# Patient Record
Sex: Male | Born: 1945 | Race: White | Hispanic: No | Marital: Married | Smoking: Former smoker
Health system: Southern US, Community
[De-identification: ages and names within clinical notes are randomized; demographics above are authoritative.]

## PROBLEM LIST (undated history)

## (undated) DIAGNOSIS — M1990 Unspecified osteoarthritis, unspecified site: Secondary | ICD-10-CM

## (undated) DIAGNOSIS — J45909 Unspecified asthma, uncomplicated: Secondary | ICD-10-CM

## (undated) DIAGNOSIS — I10 Essential (primary) hypertension: Secondary | ICD-10-CM

## (undated) DIAGNOSIS — J449 Chronic obstructive pulmonary disease, unspecified: Secondary | ICD-10-CM

## (undated) HISTORY — PX: TONSILLECTOMY: SUR1361

## (undated) HISTORY — PX: HERNIA REPAIR: SHX51

## (undated) HISTORY — DX: Unspecified asthma, uncomplicated: J45.909

## (undated) HISTORY — PX: ADENOIDECTOMY: SUR15

---

## 2014-07-06 DIAGNOSIS — M546 Pain in thoracic spine: Secondary | ICD-10-CM | POA: Diagnosis not present

## 2014-07-06 DIAGNOSIS — M9902 Segmental and somatic dysfunction of thoracic region: Secondary | ICD-10-CM | POA: Diagnosis not present

## 2014-07-21 DIAGNOSIS — M9902 Segmental and somatic dysfunction of thoracic region: Secondary | ICD-10-CM | POA: Diagnosis not present

## 2014-07-21 DIAGNOSIS — M546 Pain in thoracic spine: Secondary | ICD-10-CM | POA: Diagnosis not present

## 2014-09-09 DIAGNOSIS — M9902 Segmental and somatic dysfunction of thoracic region: Secondary | ICD-10-CM | POA: Diagnosis not present

## 2014-09-09 DIAGNOSIS — M546 Pain in thoracic spine: Secondary | ICD-10-CM | POA: Diagnosis not present

## 2014-10-05 DIAGNOSIS — H2512 Age-related nuclear cataract, left eye: Secondary | ICD-10-CM | POA: Diagnosis not present

## 2014-11-04 DIAGNOSIS — M9902 Segmental and somatic dysfunction of thoracic region: Secondary | ICD-10-CM | POA: Diagnosis not present

## 2014-11-04 DIAGNOSIS — M546 Pain in thoracic spine: Secondary | ICD-10-CM | POA: Diagnosis not present

## 2014-11-12 DIAGNOSIS — H25812 Combined forms of age-related cataract, left eye: Secondary | ICD-10-CM | POA: Diagnosis not present

## 2014-11-12 DIAGNOSIS — H2512 Age-related nuclear cataract, left eye: Secondary | ICD-10-CM | POA: Diagnosis not present

## 2014-11-25 DIAGNOSIS — M546 Pain in thoracic spine: Secondary | ICD-10-CM | POA: Diagnosis not present

## 2014-11-25 DIAGNOSIS — M9902 Segmental and somatic dysfunction of thoracic region: Secondary | ICD-10-CM | POA: Diagnosis not present

## 2014-12-10 DIAGNOSIS — J209 Acute bronchitis, unspecified: Secondary | ICD-10-CM | POA: Diagnosis not present

## 2014-12-10 DIAGNOSIS — J069 Acute upper respiratory infection, unspecified: Secondary | ICD-10-CM | POA: Diagnosis not present

## 2014-12-16 DIAGNOSIS — M9902 Segmental and somatic dysfunction of thoracic region: Secondary | ICD-10-CM | POA: Diagnosis not present

## 2014-12-16 DIAGNOSIS — M546 Pain in thoracic spine: Secondary | ICD-10-CM | POA: Diagnosis not present

## 2015-02-04 DIAGNOSIS — J069 Acute upper respiratory infection, unspecified: Secondary | ICD-10-CM | POA: Diagnosis not present

## 2015-02-04 DIAGNOSIS — J019 Acute sinusitis, unspecified: Secondary | ICD-10-CM | POA: Diagnosis not present

## 2015-02-04 DIAGNOSIS — J209 Acute bronchitis, unspecified: Secondary | ICD-10-CM | POA: Diagnosis not present

## 2015-02-24 DIAGNOSIS — M9902 Segmental and somatic dysfunction of thoracic region: Secondary | ICD-10-CM | POA: Diagnosis not present

## 2015-02-24 DIAGNOSIS — M546 Pain in thoracic spine: Secondary | ICD-10-CM | POA: Diagnosis not present

## 2015-03-15 DIAGNOSIS — M9905 Segmental and somatic dysfunction of pelvic region: Secondary | ICD-10-CM | POA: Diagnosis not present

## 2015-03-15 DIAGNOSIS — M5441 Lumbago with sciatica, right side: Secondary | ICD-10-CM | POA: Diagnosis not present

## 2015-05-03 DIAGNOSIS — M9905 Segmental and somatic dysfunction of pelvic region: Secondary | ICD-10-CM | POA: Diagnosis not present

## 2015-05-03 DIAGNOSIS — M5441 Lumbago with sciatica, right side: Secondary | ICD-10-CM | POA: Diagnosis not present

## 2015-05-28 DIAGNOSIS — M5441 Lumbago with sciatica, right side: Secondary | ICD-10-CM | POA: Diagnosis not present

## 2015-05-28 DIAGNOSIS — M9905 Segmental and somatic dysfunction of pelvic region: Secondary | ICD-10-CM | POA: Diagnosis not present

## 2015-06-11 DIAGNOSIS — M5412 Radiculopathy, cervical region: Secondary | ICD-10-CM | POA: Diagnosis not present

## 2015-06-11 DIAGNOSIS — M9901 Segmental and somatic dysfunction of cervical region: Secondary | ICD-10-CM | POA: Diagnosis not present

## 2015-06-14 DIAGNOSIS — M5412 Radiculopathy, cervical region: Secondary | ICD-10-CM | POA: Diagnosis not present

## 2015-06-14 DIAGNOSIS — M9901 Segmental and somatic dysfunction of cervical region: Secondary | ICD-10-CM | POA: Diagnosis not present

## 2015-06-24 DIAGNOSIS — M9901 Segmental and somatic dysfunction of cervical region: Secondary | ICD-10-CM | POA: Diagnosis not present

## 2015-06-24 DIAGNOSIS — M5412 Radiculopathy, cervical region: Secondary | ICD-10-CM | POA: Diagnosis not present

## 2015-06-25 DIAGNOSIS — M9901 Segmental and somatic dysfunction of cervical region: Secondary | ICD-10-CM | POA: Diagnosis not present

## 2015-06-25 DIAGNOSIS — M5412 Radiculopathy, cervical region: Secondary | ICD-10-CM | POA: Diagnosis not present

## 2015-06-28 DIAGNOSIS — M5412 Radiculopathy, cervical region: Secondary | ICD-10-CM | POA: Diagnosis not present

## 2015-06-28 DIAGNOSIS — M9901 Segmental and somatic dysfunction of cervical region: Secondary | ICD-10-CM | POA: Diagnosis not present

## 2015-07-01 DIAGNOSIS — M9901 Segmental and somatic dysfunction of cervical region: Secondary | ICD-10-CM | POA: Diagnosis not present

## 2015-07-01 DIAGNOSIS — M5412 Radiculopathy, cervical region: Secondary | ICD-10-CM | POA: Diagnosis not present

## 2015-07-08 DIAGNOSIS — M9901 Segmental and somatic dysfunction of cervical region: Secondary | ICD-10-CM | POA: Diagnosis not present

## 2015-07-08 DIAGNOSIS — M5412 Radiculopathy, cervical region: Secondary | ICD-10-CM | POA: Diagnosis not present

## 2015-07-13 DIAGNOSIS — M5412 Radiculopathy, cervical region: Secondary | ICD-10-CM | POA: Diagnosis not present

## 2015-07-13 DIAGNOSIS — M9901 Segmental and somatic dysfunction of cervical region: Secondary | ICD-10-CM | POA: Diagnosis not present

## 2015-07-15 DIAGNOSIS — M9901 Segmental and somatic dysfunction of cervical region: Secondary | ICD-10-CM | POA: Diagnosis not present

## 2015-07-15 DIAGNOSIS — M5412 Radiculopathy, cervical region: Secondary | ICD-10-CM | POA: Diagnosis not present

## 2015-07-19 DIAGNOSIS — M5412 Radiculopathy, cervical region: Secondary | ICD-10-CM | POA: Diagnosis not present

## 2015-07-19 DIAGNOSIS — M9901 Segmental and somatic dysfunction of cervical region: Secondary | ICD-10-CM | POA: Diagnosis not present

## 2015-07-22 DIAGNOSIS — M5412 Radiculopathy, cervical region: Secondary | ICD-10-CM | POA: Diagnosis not present

## 2015-07-22 DIAGNOSIS — M9901 Segmental and somatic dysfunction of cervical region: Secondary | ICD-10-CM | POA: Diagnosis not present

## 2015-07-26 DIAGNOSIS — M9901 Segmental and somatic dysfunction of cervical region: Secondary | ICD-10-CM | POA: Diagnosis not present

## 2015-07-26 DIAGNOSIS — M5412 Radiculopathy, cervical region: Secondary | ICD-10-CM | POA: Diagnosis not present

## 2015-07-28 DIAGNOSIS — M5412 Radiculopathy, cervical region: Secondary | ICD-10-CM | POA: Diagnosis not present

## 2015-07-28 DIAGNOSIS — M9901 Segmental and somatic dysfunction of cervical region: Secondary | ICD-10-CM | POA: Diagnosis not present

## 2015-08-04 DIAGNOSIS — M5412 Radiculopathy, cervical region: Secondary | ICD-10-CM | POA: Diagnosis not present

## 2015-08-04 DIAGNOSIS — M9901 Segmental and somatic dysfunction of cervical region: Secondary | ICD-10-CM | POA: Diagnosis not present

## 2015-08-09 DIAGNOSIS — M5412 Radiculopathy, cervical region: Secondary | ICD-10-CM | POA: Diagnosis not present

## 2015-08-09 DIAGNOSIS — M9901 Segmental and somatic dysfunction of cervical region: Secondary | ICD-10-CM | POA: Diagnosis not present

## 2015-08-12 DIAGNOSIS — M9901 Segmental and somatic dysfunction of cervical region: Secondary | ICD-10-CM | POA: Diagnosis not present

## 2015-08-12 DIAGNOSIS — M5412 Radiculopathy, cervical region: Secondary | ICD-10-CM | POA: Diagnosis not present

## 2015-08-19 DIAGNOSIS — M5412 Radiculopathy, cervical region: Secondary | ICD-10-CM | POA: Diagnosis not present

## 2015-08-19 DIAGNOSIS — M9901 Segmental and somatic dysfunction of cervical region: Secondary | ICD-10-CM | POA: Diagnosis not present

## 2015-08-23 DIAGNOSIS — M5412 Radiculopathy, cervical region: Secondary | ICD-10-CM | POA: Diagnosis not present

## 2015-08-23 DIAGNOSIS — M9901 Segmental and somatic dysfunction of cervical region: Secondary | ICD-10-CM | POA: Diagnosis not present

## 2015-08-26 DIAGNOSIS — M9901 Segmental and somatic dysfunction of cervical region: Secondary | ICD-10-CM | POA: Diagnosis not present

## 2015-08-26 DIAGNOSIS — M5412 Radiculopathy, cervical region: Secondary | ICD-10-CM | POA: Diagnosis not present

## 2015-09-02 DIAGNOSIS — M9901 Segmental and somatic dysfunction of cervical region: Secondary | ICD-10-CM | POA: Diagnosis not present

## 2015-09-02 DIAGNOSIS — M5412 Radiculopathy, cervical region: Secondary | ICD-10-CM | POA: Diagnosis not present

## 2015-09-08 DIAGNOSIS — M9901 Segmental and somatic dysfunction of cervical region: Secondary | ICD-10-CM | POA: Diagnosis not present

## 2015-09-08 DIAGNOSIS — M5412 Radiculopathy, cervical region: Secondary | ICD-10-CM | POA: Diagnosis not present

## 2015-09-15 ENCOUNTER — Emergency Department (HOSPITAL_COMMUNITY)
Admission: EM | Admit: 2015-09-15 | Discharge: 2015-09-15 | Disposition: A | Discharge status: Home / Self Care | Payer: Medicare Other | Attending: Emergency Medicine | Admitting: Emergency Medicine

## 2015-09-15 ENCOUNTER — Encounter (HOSPITAL_COMMUNITY): Payer: Self-pay

## 2015-09-15 ENCOUNTER — Emergency Department (HOSPITAL_COMMUNITY): Payer: Medicare Other

## 2015-09-15 DIAGNOSIS — R6889 Other general symptoms and signs: Secondary | ICD-10-CM

## 2015-09-15 DIAGNOSIS — R079 Chest pain, unspecified: Secondary | ICD-10-CM | POA: Diagnosis not present

## 2015-09-15 DIAGNOSIS — Z79899 Other long term (current) drug therapy: Secondary | ICD-10-CM | POA: Diagnosis not present

## 2015-09-15 DIAGNOSIS — J111 Influenza due to unidentified influenza virus with other respiratory manifestations: Secondary | ICD-10-CM | POA: Insufficient documentation

## 2015-09-15 DIAGNOSIS — R0602 Shortness of breath: Secondary | ICD-10-CM | POA: Diagnosis present

## 2015-09-15 DIAGNOSIS — F17200 Nicotine dependence, unspecified, uncomplicated: Secondary | ICD-10-CM | POA: Insufficient documentation

## 2015-09-15 LAB — CBC WITH DIFFERENTIAL/PLATELET
BASOS ABS: 0 10*3/uL (ref 0.0–0.1)
Basophils Relative: 0 %
Eosinophils Absolute: 0.1 10*3/uL (ref 0.0–0.7)
Eosinophils Relative: 1 %
HEMATOCRIT: 40.9 % (ref 39.0–52.0)
HEMOGLOBIN: 13.8 g/dL (ref 13.0–17.0)
LYMPHS ABS: 1.7 10*3/uL (ref 0.7–4.0)
LYMPHS PCT: 36 %
MCH: 30.5 pg (ref 26.0–34.0)
MCHC: 33.7 g/dL (ref 30.0–36.0)
MCV: 90.3 fL (ref 78.0–100.0)
Monocytes Absolute: 0.3 10*3/uL (ref 0.1–1.0)
Monocytes Relative: 6 %
NEUTROS ABS: 2.7 10*3/uL (ref 1.7–7.7)
NEUTROS PCT: 57 %
PLATELETS: 201 10*3/uL (ref 150–400)
RBC: 4.53 MIL/uL (ref 4.22–5.81)
RDW: 13.8 % (ref 11.5–15.5)
WBC: 4.7 10*3/uL (ref 4.0–10.5)

## 2015-09-15 LAB — BASIC METABOLIC PANEL
ANION GAP: 6 (ref 5–15)
BUN: 22 mg/dL — ABNORMAL HIGH (ref 6–20)
CHLORIDE: 100 mmol/L — AB (ref 101–111)
CO2: 29 mmol/L (ref 22–32)
Calcium: 8.5 mg/dL — ABNORMAL LOW (ref 8.9–10.3)
Creatinine, Ser: 1.08 mg/dL (ref 0.61–1.24)
GFR calc Af Amer: >60 mL/min (ref >60)
GLUCOSE: 132 mg/dL — AB (ref 65–99)
POTASSIUM: 4.8 mmol/L (ref 3.5–5.1)
Sodium: 135 mmol/L (ref 135–145)

## 2015-09-15 MED ORDER — IPRATROPIUM-ALBUTEROL 0.5-2.5 (3) MG/3ML IN SOLN
3.0000 mL | Freq: Once | RESPIRATORY_TRACT | Status: AC
  Administered 2015-09-15: 3 mL via RESPIRATORY_TRACT
  Filled 2015-09-15: qty 3

## 2015-09-15 MED ORDER — ALBUTEROL SULFATE HFA 108 (90 BASE) MCG/ACT IN AERS: 2.0000 | INHALATION_SPRAY | Freq: Four times a day (QID) | RESPIRATORY_TRACT | Status: DC | PRN

## 2015-09-15 MED ORDER — DM-GUAIFENESIN ER 30-600 MG PO TB12: 1.0000 | ORAL_TABLET | Freq: Two times a day (BID) | ORAL | Status: DC

## 2015-09-15 NOTE — ED Notes (Signed)
Pt ambulated to bathroom. Oxygen dropped to 88% on RA and stayed in high 80's. Pt placed on 2L nasal cannula. Sats increased to 92% at time.

## 2015-09-15 NOTE — ED Notes (Signed)
Pt reports chills and sob since Wednesday.  Says stopped having chills Sunday but still sob.  Denies cough.

## 2015-09-15 NOTE — ED Notes (Signed)
Dr. Zackowski at bedside updating patient and family. 

## 2015-09-15 NOTE — ED Provider Notes (Signed)
CSN: [id number]     Arrival date & time 09/15/15  0825 History  By signing my name below, I, Linna Darner, attest that this documentation has been prepared under the direction and in the presence of physician practitioner, Vanetta Mulders, MD. Electronically Signed: Linna Darner, Scribe. 09/15/2015. 8:37 AM.      Chief Complaint  Patient presents with  . Shortness of Breath    Patient is a 70 y.o. male presenting with shortness of breath. The history is provided by the patient. No language interpreter was used.  Shortness of Breath Severity:  Severe Onset quality:  Sudden Duration:  2 hours Timing:  Constant Progression:  Unable to specify Chronicity:  New Relieved by:  None tried Worsened by:  Nothing tried Ineffective treatments:  None tried Associated symptoms: cough, fever and wheezing   Associated symptoms: no abdominal pain, no chest pain, no headaches, no rash, no sore throat and no vomiting   Cough:    Cough characteristics:  Dry   Severity:  Mild   Onset quality:  Sudden   Duration:  1 week   Timing:  Constant   Progression:  Unable to specify   Chronicity:  New Fever:    Duration:  1 week   Timing:  Unable to specify   Temp source:  Subjective   Progression:  Unable to specify Wheezing:    Severity:  Unable to specify   Onset quality:  Sudden   Duration:  2 hours   Timing:  Constant   Progression:  Unable to specify   Chronicity:  New Risk factors: tobacco use      HPI Comments: Eric Spencer is a 70 y.o. male with no pertinent PMHx who presents to the Emergency Department complaining of sudden onset, constant, severe, SOB and associated wheezing since this morning. Pt states that he woke up this morning, went to the bathroom, and suddenly could not breathe so he came to the ER. Pt also endorses experiencing associated chills beginning one week ago and ending 3 days ago. He endorses a mild, dry cough and myalgias for the last week as well. Pt uses an  inhaler. He did not receive a flu shot this year. He denies chest pain, dysuria, hematuria, or any other associated symptoms.  PCP: Dr. Selena Batten  History reviewed. No pertinent past medical history. Past Surgical History  Procedure Laterality Date  . Hernia repair     No family history on file. Social History  Substance Use Topics  . Smoking status: Current Every Day Smoker  . Smokeless tobacco: None  . Alcohol Use: No    Review of Systems  Constitutional: Positive for fever and chills. Negative for fatigue.  HENT: Negative for rhinorrhea and sore throat.   Eyes: Negative for visual disturbance.  Respiratory: Positive for cough, shortness of breath and wheezing.        Positive for chest congestion  Cardiovascular: Negative for chest pain.  Gastrointestinal: Negative for nausea, vomiting, abdominal pain and diarrhea.  Genitourinary: Negative for dysuria.       Negative for hematuria  Musculoskeletal: Positive for myalgias. Negative for back pain and joint swelling.  Skin: Negative for rash.  Neurological: Negative for headaches.  Hematological: Does not bruise/bleed easily.  Psychiatric/Behavioral: Negative for confusion.      Allergies  Penicillins  Home Medications   Prior to Admission medications   Medication Sig Start Date End Date Taking? Authorizing Provider  VENTOLIN HFA 108 (90 Base) MCG/ACT inhaler Inhale 1 puff into the  lungs every 4 (four) hours as needed for shortness of breath.  08/13/15  Yes Historical Provider, MD  albuterol (PROVENTIL HFA;VENTOLIN HFA) 108 (90 Base) MCG/ACT inhaler Inhale 2 puffs into the lungs every 6 (six) hours as needed for wheezing or shortness of breath. 09/15/15   Vanetta MuldersScott Araina Butrick, MD  dextromethorphan-guaiFENesin (MUCINEX DM) 30-600 MG 12hr tablet Take 1 tablet by mouth 2 (two) times daily. 09/15/15   Vanetta MuldersScott Bobbi Kozakiewicz, MD   BP 134/85 mmHg  Pulse 74  Temp(Src) 97.5 F (36.4 C) (Oral)  Resp 25  Ht 6\' 2"  (1.88 m)  Wt 88.451 kg  BMI  25.03 kg/m2  SpO2 100% Physical Exam  Constitutional: He is oriented to person, place, and time. He appears well-developed and well-nourished. No distress.  HENT:  Head: Normocephalic and atraumatic.  Mouth/Throat: Oropharynx is clear and moist and mucous membranes are normal.  Eyes: Conjunctivae and EOM are normal. Pupils are equal, round, and reactive to light. No scleral icterus.  Neck: Neck supple. No tracheal deviation present.  Cardiovascular: Normal rate and regular rhythm.   Pulmonary/Chest: Effort normal. No respiratory distress. He has wheezes.  Wheezing left side  Abdominal: Bowel sounds are normal. There is no tenderness.  Musculoskeletal: Normal range of motion. He exhibits no edema.       Right ankle: He exhibits no swelling.       Left ankle: He exhibits no swelling.  Negative for bilateral ankle swelling  Neurological: He is alert and oriented to person, place, and time.  Skin: Skin is warm and dry.  Psychiatric: He has a normal mood and affect. His behavior is normal.  Nursing note and vitals reviewed.   ED Course  Procedures (including critical care time)  DIAGNOSTIC STUDIES: Oxygen Saturation is 100% on RA, normal by my interpretation.    COORDINATION OF CARE: 8:37 AM Will order DG Ribs Unilateral w/ Chest Left. Will order blood work. Will order ED EKG and cardiac monitoring. Discussed treatment plan with pt at bedside and pt agreed to plan.  Medications  ipratropium-albuterol (DUONEB) 0.5-2.5 (3) MG/3ML nebulizer solution 3 mL (3 mLs Nebulization Given 09/15/15 1102)    Results for orders placed or performed during the hospital encounter of 09/15/15  CBC with Differential  Result Value Ref Range   WBC 4.7 4.0 - 10.5 K/uL   RBC 4.53 4.22 - 5.81 MIL/uL   Hemoglobin 13.8 13.0 - 17.0 g/dL   HCT 29.540.9 28.439.0 - 13.252.0 %   MCV 90.3 78.0 - 100.0 fL   MCH 30.5 26.0 - 34.0 pg   MCHC 33.7 30.0 - 36.0 g/dL   RDW 44.013.8 10.211.5 - 72.515.5 %   Platelets 201 150 - 400 K/uL    Neutrophils Relative % 57 %   Neutro Abs 2.7 1.7 - 7.7 K/uL   Lymphocytes Relative 36 %   Lymphs Abs 1.7 0.7 - 4.0 K/uL   Monocytes Relative 6 %   Monocytes Absolute 0.3 0.1 - 1.0 K/uL   Eosinophils Relative 1 %   Eosinophils Absolute 0.1 0.0 - 0.7 K/uL   Basophils Relative 0 %   Basophils Absolute 0.0 0.0 - 0.1 K/uL  Basic metabolic panel  Result Value Ref Range   Sodium 135 135 - 145 mmol/L   Potassium 4.8 3.5 - 5.1 mmol/L   Chloride 100 (L) 101 - 111 mmol/L   CO2 29 22 - 32 mmol/L   Glucose, Bld 132 (H) 65 - 99 mg/dL   BUN 22 (H) 6 - 20 mg/dL  Creatinine, Ser 1.08 0.61 - 1.24 mg/dL   Calcium 8.5 (L) 8.9 - 10.3 mg/dL   GFR calc non Af Amer >60 >60 mL/min   GFR calc Af Amer >60 >60 mL/min   Anion gap 6 5 - 15   Dg Ribs Unilateral W/chest Left  09/15/2015  CLINICAL DATA:  Productive cough and left-sided chest pain, no known injury, initial encounter EXAM: LEFT RIBS AND CHEST - 3+ VIEW COMPARISON:  None. FINDINGS: Cardiac shadow is within normal limits. The lungs are mildly hyperinflated. No focal infiltrate or sizable effusion is seen. No acute rib fracture is noted. No other focal abnormality is seen. IMPRESSION: No acute abnormality noted. Electronically Signed   By: Alcide Clever M.D.   On: 09/15/2015 09:01     MDM   Final diagnoses:  Flu-like symptoms     patient with one-week history of flulike illness. Was doing some wheezing here resolved with albuterol Atrovent nebulizer. Oxygen was a placed on the patient early on when he went to the bathroom and his sats dropped down to 88%. This was prior to him having his nebulizer treatment. Since nebulizer treatment patient is felt fine. Sats at rest have a been consistently above 90%. Patient feels much better. The wheezing resolved with the nebulizer treatment. Patient will be continued on albuterol inhaler and Mucinex DM at home. Chest x-ray negative for pneumonia. Labs without significant abnormalities.   I personally performed  the services described in this documentation, which was scribed in my presence. The recorded information has been reviewed and is accurate.     Vanetta Mulders, MD 09/15/15 1329

## 2015-09-15 NOTE — ED Notes (Signed)
Dr. Zackowski at bedside  

## 2015-09-15 NOTE — ED Notes (Signed)
Respiratory at bedside.

## 2015-09-15 NOTE — Discharge Instructions (Signed)
Continue the albuterol inhaler 2 puffs every 6 hours for the next 7 days. Take Mucinex DM as directed. Make up on pathology record Dr. Return for new or worse symptoms.

## 2015-09-15 NOTE — ED Notes (Signed)
Dr. Deretha EmoryZackowski at bedside updating patient and family.

## 2015-09-22 DIAGNOSIS — M5412 Radiculopathy, cervical region: Secondary | ICD-10-CM | POA: Diagnosis not present

## 2015-09-22 DIAGNOSIS — M9901 Segmental and somatic dysfunction of cervical region: Secondary | ICD-10-CM | POA: Diagnosis not present

## 2015-09-30 DIAGNOSIS — M9901 Segmental and somatic dysfunction of cervical region: Secondary | ICD-10-CM | POA: Diagnosis not present

## 2015-09-30 DIAGNOSIS — M5412 Radiculopathy, cervical region: Secondary | ICD-10-CM | POA: Diagnosis not present

## 2015-10-20 DIAGNOSIS — M9901 Segmental and somatic dysfunction of cervical region: Secondary | ICD-10-CM | POA: Diagnosis not present

## 2015-10-20 DIAGNOSIS — M5412 Radiculopathy, cervical region: Secondary | ICD-10-CM | POA: Diagnosis not present

## 2015-10-28 DIAGNOSIS — M9901 Segmental and somatic dysfunction of cervical region: Secondary | ICD-10-CM | POA: Diagnosis not present

## 2015-10-28 DIAGNOSIS — M5412 Radiculopathy, cervical region: Secondary | ICD-10-CM | POA: Diagnosis not present

## 2015-11-03 DIAGNOSIS — M25511 Pain in right shoulder: Secondary | ICD-10-CM | POA: Diagnosis not present

## 2015-11-03 DIAGNOSIS — M25521 Pain in right elbow: Secondary | ICD-10-CM | POA: Diagnosis not present

## 2015-11-10 DIAGNOSIS — M25511 Pain in right shoulder: Secondary | ICD-10-CM | POA: Diagnosis not present

## 2015-11-10 DIAGNOSIS — M6281 Muscle weakness (generalized): Secondary | ICD-10-CM | POA: Diagnosis not present

## 2015-11-15 DIAGNOSIS — M25511 Pain in right shoulder: Secondary | ICD-10-CM | POA: Diagnosis not present

## 2015-11-15 DIAGNOSIS — M6281 Muscle weakness (generalized): Secondary | ICD-10-CM | POA: Diagnosis not present

## 2015-11-18 DIAGNOSIS — M25511 Pain in right shoulder: Secondary | ICD-10-CM | POA: Diagnosis not present

## 2015-11-18 DIAGNOSIS — M6281 Muscle weakness (generalized): Secondary | ICD-10-CM | POA: Diagnosis not present

## 2015-11-24 DIAGNOSIS — M6281 Muscle weakness (generalized): Secondary | ICD-10-CM | POA: Diagnosis not present

## 2015-11-24 DIAGNOSIS — M25511 Pain in right shoulder: Secondary | ICD-10-CM | POA: Diagnosis not present

## 2015-11-25 DIAGNOSIS — M9901 Segmental and somatic dysfunction of cervical region: Secondary | ICD-10-CM | POA: Diagnosis not present

## 2015-11-25 DIAGNOSIS — M5412 Radiculopathy, cervical region: Secondary | ICD-10-CM | POA: Diagnosis not present

## 2015-12-01 DIAGNOSIS — M19011 Primary osteoarthritis, right shoulder: Secondary | ICD-10-CM | POA: Diagnosis not present

## 2015-12-01 DIAGNOSIS — M25511 Pain in right shoulder: Secondary | ICD-10-CM | POA: Diagnosis not present

## 2015-12-01 DIAGNOSIS — M7501 Adhesive capsulitis of right shoulder: Secondary | ICD-10-CM | POA: Diagnosis not present

## 2015-12-02 DIAGNOSIS — M6281 Muscle weakness (generalized): Secondary | ICD-10-CM | POA: Diagnosis not present

## 2015-12-02 DIAGNOSIS — M25511 Pain in right shoulder: Secondary | ICD-10-CM | POA: Diagnosis not present

## 2015-12-08 DIAGNOSIS — M6281 Muscle weakness (generalized): Secondary | ICD-10-CM | POA: Diagnosis not present

## 2015-12-08 DIAGNOSIS — M25511 Pain in right shoulder: Secondary | ICD-10-CM | POA: Diagnosis not present

## 2016-01-03 DIAGNOSIS — M9901 Segmental and somatic dysfunction of cervical region: Secondary | ICD-10-CM | POA: Diagnosis not present

## 2016-01-03 DIAGNOSIS — M5412 Radiculopathy, cervical region: Secondary | ICD-10-CM | POA: Diagnosis not present

## 2016-01-06 DIAGNOSIS — M9901 Segmental and somatic dysfunction of cervical region: Secondary | ICD-10-CM | POA: Diagnosis not present

## 2016-01-06 DIAGNOSIS — M5412 Radiculopathy, cervical region: Secondary | ICD-10-CM | POA: Diagnosis not present

## 2016-01-13 ENCOUNTER — Ambulatory Visit (INDEPENDENT_AMBULATORY_CARE_PROVIDER_SITE_OTHER): Payer: Medicare Other | Admitting: Physician Assistant

## 2016-01-13 ENCOUNTER — Encounter: Payer: Self-pay | Admitting: Physician Assistant

## 2016-01-13 VITALS — BP 150/96 | Temp 98.7°F | Wt 184.0 lb

## 2016-01-13 DIAGNOSIS — I10 Essential (primary) hypertension: Secondary | ICD-10-CM | POA: Insufficient documentation

## 2016-01-13 DIAGNOSIS — G6289 Other specified polyneuropathies: Secondary | ICD-10-CM

## 2016-01-13 DIAGNOSIS — J439 Emphysema, unspecified: Secondary | ICD-10-CM | POA: Diagnosis not present

## 2016-01-13 DIAGNOSIS — Z720 Tobacco use: Secondary | ICD-10-CM | POA: Diagnosis not present

## 2016-01-13 DIAGNOSIS — G629 Polyneuropathy, unspecified: Secondary | ICD-10-CM | POA: Insufficient documentation

## 2016-01-13 DIAGNOSIS — F17200 Nicotine dependence, unspecified, uncomplicated: Secondary | ICD-10-CM | POA: Insufficient documentation

## 2016-01-13 DIAGNOSIS — J449 Chronic obstructive pulmonary disease, unspecified: Secondary | ICD-10-CM | POA: Insufficient documentation

## 2016-01-13 MED ORDER — FLUTICASONE FUROATE-VILANTEROL 100-25 MCG/INH IN AEPB: 1.0000 | INHALATION_SPRAY | Freq: Every day | RESPIRATORY_TRACT | Status: DC

## 2016-01-13 MED ORDER — FLUTICASONE FUROATE-VILANTEROL 200-25 MCG/INH IN AEPB: 1.0000 | INHALATION_SPRAY | Freq: Every day | RESPIRATORY_TRACT | Status: DC

## 2016-01-13 MED ORDER — VENTOLIN HFA 108 (90 BASE) MCG/ACT IN AERS: 2.0000 | INHALATION_SPRAY | RESPIRATORY_TRACT | Status: DC | PRN

## 2016-01-13 MED ORDER — VARENICLINE TARTRATE 1 MG PO TABS: 1.0000 mg | ORAL_TABLET | Freq: Two times a day (BID) | ORAL | Status: DC

## 2016-01-13 MED ORDER — VARENICLINE TARTRATE 0.5 MG PO TABS: 0.5000 mg | ORAL_TABLET | Freq: Two times a day (BID) | ORAL | Status: DC

## 2016-01-13 NOTE — Progress Notes (Signed)
Patient ID: Eric Spencer MRN: [id number], DOB: [birth year], 70 y.o. Date of Encounter: @DATE @  Chief Complaint:  Chief Complaint  Patient presents with  . Establish Care    HPI: 70 y.o. year old white male  presents as a new patient to establish care.   He states that he was originally from [place]. At that time moved back to [place] area. Grandchildren live there.   Says that he used to see Dr. Megan MansMcGinnis in [place] until he retired. Says at times he would see him and would be given antibiotics and inhaler but patient says he was never told the diagnosis "COPD."  Says that this past March he went to the hospital and had positive test for flu. Was told that he had "COPD". Was given inhaler medication to use.  Says that after that he was going to establish with a new primary care provider. However he says that that doctor just said quit smoking and walked out. Gave him no prescription medicines.  He says that over the years it would be times that he would be --- mowing the grass or getting up leaves---- that would cause him to go to the doctor and get medications.  He says that over the past 2 weeks he has been decreasing his smoking and is trying to quit. Monday and Tuesday of this week he only smoked 1 cigarette in the morning and 1 in the evening. He smoked 3 cigarettes today.  Says that he started smoking around college. Has quit twice. ~ 3 years ago quit for about one year. The second time he quit he didn't stay quit for a long. Says that most of these years he has smoked on average about 1 pack per day. Says that it is very difficult to quit and is interested in using a medicine to help him quit.  Also discussed his blood pressure is elevated today. Discussed going ahead and starting medication today. However he would prefer to come back  here for 1 more blood pressure check prior to starting meds for this. Says that if it is high on recheck that he is agreeable to take medication but wants to wait and make sure since this is his first visit.  Reports that he has noticed that he has decreased sensation on the bottoms of his toes on both feet.  He has noticed this since about March.  He drinks no alcohol.  No other complaints or concerns today.   No past medical history on file.   Home Meds: Outpatient Prescriptions Prior to Visit  Medication Sig Dispense Refill  . dextromethorphan-guaiFENesin (MUCINEX DM) 30-600 MG 12hr tablet Take 1 tablet by mouth 2 (two) times daily. 14 tablet 1  . albuterol (PROVENTIL HFA;VENTOLIN HFA) 108 (90 Base) MCG/ACT inhaler Inhale 2 puffs into the lungs every 6 (six) hours as needed for wheezing or shortness of breath. 1 Inhaler 2  . VENTOLIN HFA 108 (90 Base) MCG/ACT inhaler Inhale 1 puff into the lungs every 4 (four) hours as needed for shortness of breath.   0   No facility-administered medications prior to visit.    Allergies:  Allergies  Allergen Reactions  . Penicillins         Social History   Social History  . Marital Status: Married    Spouse Name: N/A  . Number  of Children: N/A  . Years of Education: N/A   Occupational History  . Not on file.   Social History Main Topics  . Smoking status: Current Every Day Smoker  . Smokeless tobacco: Not on file  . Alcohol Use: No  . Drug Use: No  . Sexual Activity: Not on file   Other Topics Concern  . Not on file   Social History Narrative    Family History  Problem Relation Age of Onset  . Hypertension Mother   . Arthritis Father   . Stroke Father   . Diabetes Maternal Grandmother      Review of Systems:  See HPI for pertinent ROS. All other ROS negative.    Physical Exam: Blood pressure 150/96, temperature 98.7 F (37.1 C), weight 184 lb (83.462 kg)., Body mass index is 23.61 kg/(m^2). General:  WNWD WM.  Appears in no acute distress. Neck: Supple. No thyromegaly. No lymphadenopathy. No carotid bruit. Lungs: Distant, decreased breath sounds throughout bilaterally. No wheezes rhonchi or rales. Heart: RRR with S1 S2. No murmurs, rubs, or gallops. Abdomen: Soft, non-tender, non-distended with normoactive bowel sounds. No hepatomegaly. No rebound/guarding. No obvious abdominal masses. Musculoskeletal:  Strength and tone normal for age. Extremities/Skin: Warm and dry. No LE edema.  Neuro: Alert and oriented X 3. Moves all extremities spontaneously. Gait is normal. CNII-XII grossly in tact. Psych:  Responds to questions appropriately with a normal affect.     ASSESSMENT AND PLAN:  70 y.o. year old male with  1. Pulmonary emphysema, unspecified emphysema type (HCC) He reports that recently he has been needing his Ventolin 4 times a day. Discussed that he uses the [facility] once a day regardless of his breathing. He continues to use the Ventolin as needed. - VENTOLIN HFA 108 (90 Base) MCG/ACT inhaler; Inhale 2 puffs into the lungs every 4 (four) hours as needed for shortness of breath.  Dispense: 1 Inhaler; Refill: 6 - fluticasone furoate-vilanterol (BREO ELLIPTA) 100-25 MCG/INH AEPB; Inhale 1 puff into the lungs daily.  Dispense: 1 each; Refill: 6  2. Essential hypertension He is going to return for follow-up visit in about 2 weeks. We'll recheck blood pressure at that time to determine whether he needs medication for hypertension.  3. Smoker Reviewed with him that the 0.5 mg the starting pack and at the 1 mg dose is the continuing pack to use after the 0.5 mg pack. If cost ends up being an issue with his given insurance plan, then will switch to Wellbutrin. Also, discussed that if he thinks he is having any adverse effects with the Chantix and stop the medication and call us immediately. - varenicline (CHANTIX) 0.5 MG tablet; Take 1 tablet (0.5 mg total) by mouth 2 (two) times daily.  Dispense: 60  tablet; Refill: 0 - varenicline (CHANTIX CONTINUING MONTH PAK) 1 MG tablet; Take 1 tablet (1 mg total) by mouth 2 (two) times daily.  Dispense: 60 tablet; Refill: 3  4. Other polyneuropathy (HCC) Will check labs to evaluate for cause of his decreased sensation in his toes bilaterally. He reports that he drinks no alcohol. - CBC with Differential/Platelet - COMPLETE METABOLIC PANEL WITH GFR - TSH - Hemoglobin A1c - Vitamin B12   He is going to schedule follow-up office visit in 2 weeks. Discussed with him that if he wants to, he can schedule that as a physical. Also discussed that if he wants to, he can come fasting to that appointment and can check lipid panel. Regardless, he will  definitely follow up in 2 weeks to recheck blood pressure to determine whether he needs medication for hypertension.  [address] [place], [place], [facility] 01/13/2016 3:53 PM

## 2016-01-14 LAB — HEMOGLOBIN A1C
HEMOGLOBIN A1C: 5.3 % (ref <5.7)
MEAN PLASMA GLUCOSE: 105 mg/dL

## 2016-01-14 LAB — CBC WITH DIFFERENTIAL/PLATELET
Basophils Absolute: 0 cells/uL (ref 0–200)
Basophils Relative: 0 %
EOS PCT: 2 %
Eosinophils Absolute: 112 cells/uL (ref 15–500)
HCT: 40.9 % (ref 38.5–50.0)
HEMOGLOBIN: 13.2 g/dL (ref 13.0–17.0)
LYMPHS PCT: 29 %
Lymphs Abs: 1624 cells/uL (ref 850–3900)
MCH: 30.2 pg (ref 27.0–33.0)
MCHC: 32.3 g/dL (ref 32.0–36.0)
MCV: 93.6 fL (ref 80.0–100.0)
MONOS PCT: 4 %
MPV: 9.6 fL (ref 7.5–12.5)
Monocytes Absolute: 224 cells/uL (ref 200–950)
Neutro Abs: 3640 cells/uL (ref 1500–7800)
Neutrophils Relative %: 65 %
PLATELETS: 223 10*3/uL (ref 140–400)
RBC: 4.37 MIL/uL (ref 4.20–5.80)
RDW: 14.1 % (ref 11.0–15.0)
WBC: 5.6 10*3/uL (ref 3.8–10.8)

## 2016-01-14 LAB — COMPLETE METABOLIC PANEL WITH GFR
ALBUMIN: 3.8 g/dL (ref 3.6–5.1)
ALK PHOS: 58 U/L (ref 40–115)
ALT: 7 U/L — ABNORMAL LOW (ref 9–46)
AST: 12 U/L (ref 10–35)
BILIRUBIN TOTAL: 0.5 mg/dL (ref 0.2–1.2)
BUN: 27 mg/dL — ABNORMAL HIGH (ref 7–25)
CO2: 26 mmol/L (ref 20–31)
Calcium: 8.7 mg/dL (ref 8.6–10.3)
Chloride: 104 mmol/L (ref 98–110)
Creat: 1.29 mg/dL — ABNORMAL HIGH (ref 0.70–1.25)
GFR, EST AFRICAN AMERICAN: 65 mL/min (ref >=60)
GFR, EST NON AFRICAN AMERICAN: 57 mL/min — AB (ref >=60)
Glucose, Bld: 89 mg/dL (ref 70–99)
Potassium: 4.6 mmol/L (ref 3.5–5.3)
Sodium: 137 mmol/L (ref 135–146)
TOTAL PROTEIN: 6.4 g/dL (ref 6.1–8.1)

## 2016-01-14 LAB — VITAMIN B12: Vitamin B-12: 407 pg/mL (ref 200–1100)

## 2016-01-14 LAB — TSH: TSH: 1.7 m[IU]/L (ref 0.40–4.50)

## 2016-01-27 ENCOUNTER — Encounter: Payer: Self-pay | Admitting: *Deleted

## 2016-01-27 ENCOUNTER — Ambulatory Visit: Payer: Medicare Other | Admitting: *Deleted

## 2016-01-27 VITALS — BP 160/78

## 2016-01-27 DIAGNOSIS — I10 Essential (primary) hypertension: Secondary | ICD-10-CM

## 2016-01-27 DIAGNOSIS — M5412 Radiculopathy, cervical region: Secondary | ICD-10-CM | POA: Diagnosis not present

## 2016-01-27 DIAGNOSIS — M9901 Segmental and somatic dysfunction of cervical region: Secondary | ICD-10-CM | POA: Diagnosis not present

## 2016-01-27 NOTE — Progress Notes (Signed)
  Patient seen in office to have BP re-checked.   I have copied relevant portions of prior office visit with PA on 01/13/2016.  "Discussed going ahead and starting medication today. However he would prefer to come back here for 1 more blood pressure check prior to starting meds for this. Says that if it is high on recheck that he is agreeable to take medication but wants to wait and make sure since this is his first visit.   Essential hypertension He is going to return for follow-up visit in about 2 weeks. We'll recheck blood pressure at that time to determine whether he needs medication for hypertension."  Patient BP noted elevated at 168/ 84 in R arm, sitting and 160/ 78 in L arm sitting after waiting 5 minutes.   Patient scheduled OV with MD to F/U and discuss BP medication since PCP is on vacation.   Patient reports that he has had high BP readings every time he has come to the doctor since he was in high school. 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   MD to be made aware.

## 2016-01-28 ENCOUNTER — Encounter: Payer: Self-pay | Admitting: Family Medicine

## 2016-01-28 ENCOUNTER — Ambulatory Visit (INDEPENDENT_AMBULATORY_CARE_PROVIDER_SITE_OTHER): Payer: Medicare Other | Admitting: Family Medicine

## 2016-01-28 VITALS — BP 158/84 | HR 84 | Temp 98.2°F | Resp 18 | Ht 74.0 in | Wt 186.0 lb

## 2016-01-28 DIAGNOSIS — Z720 Tobacco use: Secondary | ICD-10-CM | POA: Diagnosis not present

## 2016-01-28 DIAGNOSIS — I10 Essential (primary) hypertension: Secondary | ICD-10-CM | POA: Diagnosis not present

## 2016-01-28 DIAGNOSIS — F17200 Nicotine dependence, unspecified, uncomplicated: Secondary | ICD-10-CM

## 2016-01-28 MED ORDER — LOSARTAN POTASSIUM 100 MG PO TABS: 100.0000 mg | ORAL_TABLET | Freq: Every day | ORAL | 3 refills | Status: DC

## 2016-01-28 NOTE — Progress Notes (Signed)
   Subjective:    Patient ID: Eric Spencer, male    DOB: Nov 29, 1945, 70 y.o.   MRN: [id number]  HPI Patient's blood pressure here today is very high. It was also high earlier this week at a nurse visit. His systolic blood pressure is frequently recorded and found to be between 150 and 160. However he is convinced that he has white coat syndrome. He also smokes. I reviewed his most recent lab work. All his labs were excellent. We do not know what his cholesterol is. He denies any chest pain. He does have dyspnea on exertion which he attributes to his emphysema No past medical history on file. Past Surgical History:  Procedure Laterality Date  . HERNIA REPAIR     Current Outpatient Prescriptions on File Prior to Visit  Medication Sig Dispense Refill  . fluticasone furoate-vilanterol (BREO ELLIPTA) 100-25 MCG/INH AEPB Inhale 1 puff into the lungs daily. 1 each 4  . VENTOLIN HFA 108 (90 Base) MCG/ACT inhaler Inhale 2 puffs into the lungs every 4 (four) hours as needed for shortness of breath. 1 Inhaler 6  . varenicline (CHANTIX CONTINUING MONTH PAK) 1 MG tablet Take 1 tablet (1 mg total) by mouth 2 (two) times daily. (Patient not taking: Reported on 01/28/2016) 60 tablet 3  . varenicline (CHANTIX) 0.5 MG tablet Take 1 tablet (0.5 mg total) by mouth 2 (two) times daily. (Patient not taking: Reported on 01/28/2016) 60 tablet 0   No current facility-administered medications on file prior to visit.    Allergies  Allergen Reactions  . Penicillins    Social History   Social History  . Marital status: Married    Spouse name: N/A  . Number of children: N/A  . Years of education: N/A   Occupational History  . Not on file.   Social History Main Topics  . Smoking status: Current Every Day Smoker  . Smokeless tobacco: Not on file  . Alcohol use No  . Drug use: No  . Sexual activity: Not on file   Other Topics Concern  . Not on file   Social History Narrative  . No narrative on file       Review of Systems  All other systems reviewed and are negative.      Objective:   Physical Exam  Constitutional: He appears well-developed and well-nourished.  Cardiovascular: Normal rate, regular rhythm, normal heart sounds and intact distal pulses.   No murmur heard. Pulmonary/Chest: Effort normal and breath sounds normal. No respiratory distress. He has no wheezes. He has no rales.  Abdominal: Soft. Bowel sounds are normal. He exhibits no distension. There is no tenderness. There is no rebound.  Musculoskeletal: He exhibits no edema.  Vitals reviewed.         Assessment & Plan:  Essential hypertension  Smoker  Patient's blood pressures very high. I recommended starting losartan 100 mg a day. He however would like to check his blood pressure daily over the weekend with his own blood pressure cuff to verify that he truly has high blood pressure prior to starting the medication. If he does start the medication I will recheck his blood pressure in one month. Also recommended smoking cessation. Patient is taking ibuprofen several times a day and therefore I did not recommend taking a baby aspirin due to the risk of GI bleed

## 2016-02-07 DIAGNOSIS — M5412 Radiculopathy, cervical region: Secondary | ICD-10-CM | POA: Diagnosis not present

## 2016-02-07 DIAGNOSIS — M9901 Segmental and somatic dysfunction of cervical region: Secondary | ICD-10-CM | POA: Diagnosis not present

## 2016-02-21 ENCOUNTER — Ambulatory Visit (INDEPENDENT_AMBULATORY_CARE_PROVIDER_SITE_OTHER): Payer: Medicare Other | Admitting: Physician Assistant

## 2016-02-21 ENCOUNTER — Encounter: Payer: Self-pay | Admitting: Physician Assistant

## 2016-02-21 VITALS — BP 190/94 | HR 113 | Resp 18 | Wt 188.0 lb

## 2016-02-21 DIAGNOSIS — B9689 Other specified bacterial agents as the cause of diseases classified elsewhere: Secondary | ICD-10-CM

## 2016-02-21 DIAGNOSIS — J988 Other specified respiratory disorders: Secondary | ICD-10-CM | POA: Diagnosis not present

## 2016-02-21 DIAGNOSIS — I10 Essential (primary) hypertension: Secondary | ICD-10-CM

## 2016-02-21 DIAGNOSIS — J439 Emphysema, unspecified: Secondary | ICD-10-CM

## 2016-02-21 MED ORDER — LOSARTAN POTASSIUM 50 MG PO TABS: 50.0000 mg | ORAL_TABLET | Freq: Every day | ORAL | 0 refills | Status: DC

## 2016-02-21 MED ORDER — DOXYCYCLINE HYCLATE 100 MG PO TABS: 100.0000 mg | ORAL_TABLET | Freq: Two times a day (BID) | ORAL | 0 refills | Status: DC

## 2016-02-21 MED ORDER — PREDNISONE 20 MG PO TABS: 20.0000 mg | ORAL_TABLET | Freq: Every day | ORAL | 0 refills | Status: DC

## 2016-02-21 NOTE — Progress Notes (Signed)
Patient ID: Eric Spencer MRN: [id number], DOB: 1946/03/28, 70 y.o. Date of Encounter: '@DATE' @  Chief Complaint:  Chief Complaint  Patient presents with  . office visit    bp check    HPI: 70 y.o. year old white male    01/13/2016: presents as a new patient to establish care.   He states that he was originally from [place]. [place] about [place]. At that time moved back to [place] area. Grandchildren live there.   Says that he used to see Dr. Emilee Hero in [place] until he retired. Says at times he would see him and would be given antibiotics and inhaler but patient says he was never told the diagnosis "COPD."  Says that this past March he went to the hospital and had positive test for flu. Was told that he had "COPD". Was given inhaler medication to use.  Says that after that he was going to establish with a new primary care provider. However he says that that doctor just said quit smoking and walked out. Gave him no prescription medicines.  He says that over the years it would be times that he would be --- mowing the grass or getting up leaves---- that would cause him to go to the doctor and get medications.  He says that over the past 2 weeks he has been decreasing his smoking and is trying to quit. Monday and Tuesday of this week he only smoked 1 cigarette in the morning and 1 in the evening. He smoked 3 cigarettes today.  Says that he started smoking around college. Has quit twice. ~ 3 years ago quit for about one year. The second time he quit he didn't stay quit for a long. Says that most of these years he has smoked on average about 1 pack per day. Says that it is very difficult to quit and is interested in using a medicine to help him quit.  Also discussed his blood pressure is elevated today. Discussed going ahead and starting medication today. However he  would prefer to come back here for 1 more blood pressure check prior to starting meds for this. Says that if it is high on recheck that he is agreeable to take medication but wants to wait and make sure since this is his first visit.  Reports that he has noticed that he has decreased sensation on the bottoms of his toes on both feet.  He has noticed this since about March.  He drinks no alcohol.  No other complaints or concerns today.  AT THAT OV-- Refused to start med blood pressure medication at that time. Instead, planned that he would return for follow-up visit in 1-2 weeks to recheck blood pressure. He came on 01/27/16 and had nurse blood pressure check. She got blood pressure readings elevated at 160/78 and 168/84. Then scheduled to see Dr. Dennard Schaumann for the following day. He had visit with Dr. Dennard Schaumann on 01/28/16.  At that time he started losartan 100 mg daily.  02/21/2016: Today patient reports that he did not start the losartan. Says that the pharmacist made a comment that that was a really high dose to start with. His friends also made the same remark--so then he was scared to take it. Afraid that it was "too much ". Says that he would be agreeable to take the 50 mg tablet but was scared  to take the 100 mg. As well he bought a blood pressure machine and has been checking his blood pressure and writing down his readings and he brings that in today. All of his readings he has documented are 130s over 70s to 140s over 80s. Such as 132/75 140/78 136/74. He also says that he is having coughing and symptoms just like he had in the past. Says it's just like before-- he mowed the grass on Thursday and then "it hit me Friday "coughing up thick dark green phlegm. Has some drainage from his nose. Throat feels scratchy. Not getting much sleep secondary to cough. Has used his albuterol inhaler multiple times yesterday. Feels like he has a lot of congestion in his chest and he can't get out. No fevers or  chills. No other complaints or concerns today.  No past medical history on file.   Home Meds: Outpatient Medications Prior to Visit  Medication Sig Dispense Refill  . fluticasone furoate-vilanterol (BREO ELLIPTA) 100-25 MCG/INH AEPB Inhale 1 puff into the lungs daily. 1 each 4  . VENTOLIN HFA 108 (90 Base) MCG/ACT inhaler Inhale 2 puffs into the lungs every 4 (four) hours as needed for shortness of breath. 1 Inhaler 6  . losartan (COZAAR) 100 MG tablet Take 1 tablet (100 mg total) by mouth daily. (Patient not taking: Reported on 02/21/2016) 90 tablet 3  . varenicline (CHANTIX CONTINUING MONTH PAK) 1 MG tablet Take 1 tablet (1 mg total) by mouth 2 (two) times daily. (Patient not taking: Reported on 01/28/2016) 60 tablet 3  . varenicline (CHANTIX) 0.5 MG tablet Take 1 tablet (0.5 mg total) by mouth 2 (two) times daily. (Patient not taking: Reported on 01/28/2016) 60 tablet 0   No facility-administered medications prior to visit.     Allergies:  Allergies  Allergen Reactions  . Penicillins         Social History   Social History  . Marital status: Married    Spouse name: N/A  . Number of children: N/A  . Years of education: N/A   Occupational History  . Not on file.   Social History Main Topics  . Smoking status: Current Every Day Smoker  . Smokeless tobacco: Not on file  . Alcohol use No  . Drug use: No  . Sexual activity: Not on file   Other Topics Concern  . Not on file   Social History Narrative  . No narrative on file    Family History  Problem Relation Age of Onset  . Hypertension Mother   . Arthritis Father   . Stroke Father   . Diabetes Maternal Grandmother      Review of Systems:  See HPI for pertinent ROS. All other ROS negative.    Physical Exam: Blood pressure (!) 190/94, pulse (!) 113, resp. rate 18, weight 188 lb (85.3 kg)., Body mass index is 24.14 kg/m. General:  WNWD WM. Appears in no acute distress. Neck: Supple. No thyromegaly. No  lymphadenopathy. No carotid bruit. Lungs: Distant, decreased breath sounds throughout bilaterally. No wheezes rhonchi or rales. Heart: RRR with S1 S2. No murmurs, rubs, or gallops. Abdomen: Soft, non-tender, non-distended with normoactive bowel sounds. No hepatomegaly. No rebound/guarding. No obvious abdominal masses. Musculoskeletal:  Strength and tone normal for age. Extremities/Skin: Warm and dry. No LE edema.  Neuro: Alert and oriented X 3. Moves all extremities spontaneously. Gait is normal. CNII-XII grossly in tact. Psych:  Responds to questions appropriately with a normal affect.     ASSESSMENT  AND PLAN:  70 y.o. year old male with   1. Essential hypertension He is to take losartan 50 mg daily. He is to return for follow-up visit in 2 weeks at which time we will recheck blood pressure and be met on medication. - losartan (COZAAR) 50 MG tablet; Take 1 tablet (50 mg total) by mouth daily.  Dispense: 30 tablet; Refill: 0  2. Bacterial respiratory infection He is to take the doxycycline as directed as well as the prednisone. He needs to use Mucinex DM as expectorant. Continue Breo  and continue albuterol as needed. Follow-up if needed. - doxycycline (VIBRA-TABS) 100 MG tablet; Take 1 tablet (100 mg total) by mouth 2 (two) times daily.  Dispense: 20 tablet; Refill: 0 - predniSONE (DELTASONE) 20 MG tablet; Take 1 tablet (20 mg total) by mouth daily with breakfast.  Dispense: 5 tablet; Refill: 0  3. Pulmonary emphysema, unspecified emphysema type (HCC) - doxycycline (VIBRA-TABS) 100 MG tablet; Take 1 tablet (100 mg total) by mouth 2 (two) times daily.  Dispense: 20 tablet; Refill: 0 - predniSONE (DELTASONE) 20 MG tablet; Take 1 tablet (20 mg total) by mouth daily with breakfast.  Dispense: 5 tablet; Refill: 0      Signed, [address] [place], [place], [facility] 02/21/2016 12:11 PM

## 2016-02-22 ENCOUNTER — Telehealth: Payer: Self-pay

## 2016-02-23 NOTE — Telephone Encounter (Signed)
Tessalon 200mg  1 po TID PRN cough # 30 + 1

## 2016-02-24 MED ORDER — BENZONATATE 200 MG PO CAPS: 200.0000 mg | ORAL_CAPSULE | Freq: Three times a day (TID) | ORAL | 1 refills | Status: DC | PRN

## 2016-02-24 NOTE — Telephone Encounter (Signed)
rx sent to pharmacy notified pt

## 2016-03-02 DIAGNOSIS — M9901 Segmental and somatic dysfunction of cervical region: Secondary | ICD-10-CM | POA: Diagnosis not present

## 2016-03-02 DIAGNOSIS — M5412 Radiculopathy, cervical region: Secondary | ICD-10-CM | POA: Diagnosis not present

## 2016-03-08 ENCOUNTER — Ambulatory Visit: Payer: Medicare Other | Admitting: Physician Assistant

## 2016-03-09 ENCOUNTER — Ambulatory Visit (INDEPENDENT_AMBULATORY_CARE_PROVIDER_SITE_OTHER): Payer: Medicare Other | Admitting: Physician Assistant

## 2016-03-09 ENCOUNTER — Encounter: Payer: Self-pay | Admitting: Physician Assistant

## 2016-03-09 VITALS — BP 160/86 | HR 83 | Temp 97.8°F | Resp 18 | Wt 186.0 lb

## 2016-03-09 DIAGNOSIS — I10 Essential (primary) hypertension: Secondary | ICD-10-CM | POA: Diagnosis not present

## 2016-03-09 LAB — BASIC METABOLIC PANEL WITH GFR
BUN: 19 mg/dL (ref 7–25)
CHLORIDE: 104 mmol/L (ref 98–110)
CO2: 29 mmol/L (ref 20–31)
Calcium: 9.4 mg/dL (ref 8.6–10.3)
Creat: 1.15 mg/dL (ref 0.70–1.25)
GFR, Est African American: 75 mL/min (ref >=60)
GFR, Est Non African American: 65 mL/min (ref >=60)
Glucose, Bld: 119 mg/dL — ABNORMAL HIGH (ref 70–99)
POTASSIUM: 5.9 mmol/L — AB (ref 3.5–5.3)
Sodium: 138 mmol/L (ref 135–146)

## 2016-03-09 MED ORDER — LOSARTAN POTASSIUM 100 MG PO TABS: 100.0000 mg | ORAL_TABLET | Freq: Every day | ORAL | 0 refills | Status: DC

## 2016-03-09 NOTE — Progress Notes (Signed)
Patient ID: Eric Spencer MRN: [id number], DOB: [birth year], 70 y.o. Date of Encounter: @DATE @  Chief Complaint:  Chief Complaint  Patient presents with  . Follow-up    HPI: 70 y.o. year old white male    01/13/2016: presents as a new patient to establish care.   He states that he was originally from [place]. [place]. At that time moved back to [place] area. Grandchildren live there.   Says that he used to see Dr. Megan MansMcGinnis in [place] until he retired. Says at times he would see him and would be given antibiotics and inhaler but patient says he was never told the diagnosis "COPD."  Says that this past March he went to the hospital and had positive test for flu. Was told that he had "COPD". Was given inhaler medication to use.  Says that after that he was going to establish with a new primary care provider. However he says that that doctor just said quit smoking and walked out. Gave him no prescription medicines.  He says that over the years it would be times that he would be --- mowing the grass or getting up leaves---- that would cause him to go to the doctor and get medications.  He says that over the past 2 weeks he has been decreasing his smoking and is trying to quit. Monday and Tuesday of this week he only smoked 1 cigarette in the morning and 1 in the evening. He smoked 3 cigarettes today.  Says that he started smoking around college. Has quit twice. ~ 3 years ago quit for about one year. The second time he quit he didn't stay quit for a long. Says that most of these years he has smoked on average about 1 pack per day. Says that it is very difficult to quit and is interested in using a medicine to help him quit.  Also discussed his blood pressure is elevated today. Discussed going ahead and starting medication today. However he would prefer to  come back here for 1 more blood pressure check prior to starting meds for this. Says that if it is high on recheck that he is agreeable to take medication but wants to wait and make sure since this is his first visit.  Reports that he has noticed that he has decreased sensation on the bottoms of his toes on both feet.  He has noticed this since about March.  He drinks no alcohol.  No other complaints or concerns today.  AT THAT OV-- Refused to start med blood pressure medication at that time. Instead, planned that he would return for follow-up visit in 1-2 weeks to recheck blood pressure. He came on 01/27/16 and had nurse blood pressure check. She got blood pressure readings elevated at 160/78 and 168/84. Then scheduled to see Dr. Tanya NonesPickard for the following day. He had visit with Dr. Tanya NonesPickard on 01/28/16.  At that time he started losartan 100 mg daily.  02/21/2016: Today patient reports that he did not start the losartan. Says that the pharmacist made a comment that that was a really high dose to start with. His friends also made the same remark--so then he was scared to take it. Afraid that it was "too much ". Says that he would be agreeable to take the 50 mg tablet but was scared to take the 100 mg. As  well he bought a blood pressure machine and has been checking his blood pressure and writing down his readings and he brings that in today. All of his readings he has documented are 130s over 70s to 140s over 80s. Such as 132/75 140/78 136/74. He also says that he is having coughing and symptoms just like he had in the past. Says it's just like before-- he mowed the grass on Thursday and then "it hit me Friday "coughing up thick dark green phlegm. Has some drainage from his nose. Throat feels scratchy. Not getting much sleep secondary to cough. Has used his albuterol inhaler multiple times yesterday. Feels like he has a lot of congestion in his chest and he can't get out. No fevers or chills. No other  complaints or concerns today.  AT THAT OV---Rxed:  Losartan 50mg  1 po QD Doxy 100mg  BID x 10d Pred 20mg  QD x 5 d  03/09/2016: He reports that he has been taking that losartan 50 mg daily. Having no adverse effects. Says he plans to go to [place] and buy him a new blood pressure monitor that is accurate. Reports that he did take the doxycycline and prednisone as directed. Reports that his chest congestion and increased cough have resolved and have returned to his baseline. No longer coughing up thick dark phlegm.  No past medical history on file.   Home Meds: Outpatient Medications Prior to Visit  Medication Sig Dispense Refill  . doxycycline (VIBRA-TABS) 100 MG tablet Take 1 tablet (100 mg total) by mouth 2 (two) times daily. 20 tablet 0  . fluticasone furoate-vilanterol (BREO ELLIPTA) 100-25 MCG/INH AEPB Inhale 1 puff into the lungs daily. 1 each 4  . VENTOLIN HFA 108 (90 Base) MCG/ACT inhaler Inhale 2 puffs into the lungs every 4 (four) hours as needed for shortness of breath. 1 Inhaler 6  . losartan (COZAAR) 50 MG tablet Take 1 tablet (50 mg total) by mouth daily. 30 tablet 0  . benzonatate (TESSALON) 200 MG capsule Take 1 capsule (200 mg total) by mouth 3 (three) times daily as needed for cough. (Patient not taking: Reported on 03/09/2016) 30 capsule 1  . predniSONE (DELTASONE) 20 MG tablet Take 1 tablet (20 mg total) by mouth daily with breakfast. (Patient not taking: Reported on 03/09/2016) 5 tablet 0   No facility-administered medications prior to visit.     Allergies:  Allergies  Allergen Reactions  . Penicillins         Social History   Social History  . Marital status: Married    Spouse name: N/A  . Number of children: N/A  . Years of education: N/A   Occupational History  . Not on file.   Social History Main Topics  . Smoking status: Current Some Day Smoker  . Smokeless tobacco: Never Used  . Alcohol use No  . Drug use: No  . Sexual activity: Not  on file   Other Topics Concern  . Not on file   Social History Narrative  . No narrative on file    Family History  Problem Relation Age of Onset  . Hypertension Mother   . Arthritis Father   . Stroke Father   . Diabetes Maternal Grandmother      Review of Systems:  See HPI for pertinent ROS. All other ROS negative.    Physical Exam: Blood pressure (!) 160/86, pulse 83, temperature 97.8 F (36.6 C), temperature source Oral, resp. rate 18, weight 186 lb (84.4 kg)., Body mass  index is 23.88 kg/m. General:  WNWD WM. Appears in no acute distress. Neck: Supple. No thyromegaly. No lymphadenopathy. No carotid bruit. Lungs: Distant, decreased breath sounds throughout bilaterally. No wheezes rhonchi or rales. Heart: RRR with S1 S2. No murmurs, rubs, or gallops. Abdomen: Soft, non-tender, non-distended with normoactive bowel sounds. No hepatomegaly. No rebound/guarding. No obvious abdominal masses. Musculoskeletal:  Strength and tone normal for age. Extremities/Skin: Warm and dry. No LE edema.  Neuro: Alert and oriented X 3. Moves all extremities spontaneously. Gait is normal. CNII-XII grossly in tact. Psych:  Responds to questions appropriately with a normal affect.     ASSESSMENT AND PLAN:  70 y.o. year old male with    1. Essential hypertension I rechecked blood pressure myself and am getting about 172/90. He definitely needs to increase blood pressure medication. Discussed increasing losartan to 100 mg and he is agreeable to do so.  Will go ahead and check BMET today to monitor on the 50 mg dose and then he will increase to 100 mg.  He will then return for follow-up visit again in 2 weeks to recheck BP and BMET on the increased 100 mg dose.  Discussed all of this with him and he voices understanding and agrees. - BASIC METABOLIC PANEL WITH GFR - losartan (COZAAR) 100 MG tablet; Take 1 tablet (100 mg total) by mouth daily.  Dispense: 30 tablet; Refill: 0     Signed, [address] [place], [place], [facility] 03/09/2016 11:41 AM

## 2016-03-23 ENCOUNTER — Encounter: Payer: Self-pay | Admitting: Physician Assistant

## 2016-03-23 ENCOUNTER — Ambulatory Visit (INDEPENDENT_AMBULATORY_CARE_PROVIDER_SITE_OTHER): Payer: Medicare Other | Admitting: Physician Assistant

## 2016-03-23 VITALS — BP 140/80 | HR 74 | Temp 97.7°F | Resp 18 | Wt 191.0 lb

## 2016-03-23 DIAGNOSIS — I10 Essential (primary) hypertension: Secondary | ICD-10-CM

## 2016-03-23 LAB — BASIC METABOLIC PANEL WITH GFR
BUN: 22 mg/dL (ref 7–25)
CHLORIDE: 106 mmol/L (ref 98–110)
CO2: 28 mmol/L (ref 20–31)
CREATININE: 1.01 mg/dL (ref 0.70–1.25)
Calcium: 9.1 mg/dL (ref 8.6–10.3)
GFR, Est African American: 88 mL/min (ref >=60)
GFR, Est Non African American: 76 mL/min (ref >=60)
GLUCOSE: 108 mg/dL — AB (ref 70–99)
POTASSIUM: 5.1 mmol/L (ref 3.5–5.3)
Sodium: 141 mmol/L (ref 135–146)

## 2016-03-23 NOTE — Progress Notes (Signed)
Patient ID: Johnney OuCharlie E Oakes Jr MRN: [id number], DOB: [birth year], 70 y.o. Date of Encounter: @DATE @  Chief Complaint:  Chief Complaint  Patient presents with  . Follow-up    HPI: 70 y.o. year old white male    01/13/2016: presents as a new patient to establish care.   He states that he was originally from [place]. [place]. At that time moved back to [place] area. Grandchildren live there.   Says that he used to see Dr. Megan MansMcGinnis in [place] until he retired. Says at times he would see him and would be given antibiotics and inhaler but patient says he was never told the diagnosis "COPD."  Says that this past March he went to the hospital and had positive test for flu. Was told that he had "COPD". Was given inhaler medication to use.  Says that after that he was going to establish with a new primary care provider. However he says that that doctor just said quit smoking and walked out. Gave him no prescription medicines.  He says that over the years it would be times that he would be --- mowing the grass or getting up leaves---- that would cause him to go to the doctor and get medications.  He says that over the past 2 weeks he has been decreasing his smoking and is trying to quit. Monday and Tuesday of this week he only smoked 1 cigarette in the morning and 1 in the evening. He smoked 3 cigarettes today.  Says that he started smoking around college. Has quit twice. ~ 3 years ago quit for about one year. The second time he quit he didn't stay quit for a long. Says that most of these years he has smoked on average about 1 pack per day. Says that it is very difficult to quit and is interested in using a medicine to help him quit.  Also discussed his blood pressure is elevated today. Discussed going ahead and starting medication today. However he would prefer to  come back here for 1 more blood pressure check prior to starting meds for this. Says that if it is high on recheck that he is agreeable to take medication but wants to wait and make sure since this is his first visit.  Reports that he has noticed that he has decreased sensation on the bottoms of his toes on both feet.  He has noticed this since about March.  He drinks no alcohol.  No other complaints or concerns today.  AT THAT OV-- Refused to start med blood pressure medication at that time. Instead, planned that he would return for follow-up visit in 1-2 weeks to recheck blood pressure. He came on 01/27/16 and had nurse blood pressure check. She got blood pressure readings elevated at 160/78 and 168/84. Then scheduled to see Dr. Tanya NonesPickard for the following day. He had visit with Dr. Tanya NonesPickard on 01/28/16.  At that time he started losartan 100 mg daily.  02/21/2016: Today patient reports that he did not start the losartan. Says that the pharmacist made a comment that that was a really high dose to start with. His friends also made the same remark--so then he was scared to take it. Afraid that it was "too much ". Says that he would be agreeable to take the 50 mg tablet but was scared to take the 100 mg. As  well he bought a blood pressure machine and has been checking his blood pressure and writing down his readings and he brings that in today. All of his readings he has documented are 130s over 70s to 140s over 80s. Such as 132/75 140/78 136/74. He also says that he is having coughing and symptoms just like he had in the past. Says it's just like before-- he mowed the grass on Thursday and then "it hit me Friday "coughing up thick dark green phlegm. Has some drainage from his nose. Throat feels scratchy. Not getting much sleep secondary to cough. Has used his albuterol inhaler multiple times yesterday. Feels like he has a lot of congestion in his chest and he can't get out. No fevers or chills. No other  complaints or concerns today.  AT THAT OV---Rxed:  Losartan 50mg  1 po QD Doxy 100mg  BID x 10d Pred 20mg  QD x 5 d  03/09/2016: He reports that he has been taking that losartan 50 mg daily. Having no adverse effects. Says he plans to go to [place] and buy him a new blood pressure monitor that is accurate. Reports that he did take the doxycycline and prednisone as directed. Reports that his chest congestion and increased cough have resolved and have returned to his baseline. No longer coughing up thick dark phlegm. AT THAT OV: Increased Losartan to 100mg  QD  03/23/2016: He reports he has been taking the losartan 100 mg daily. Having no adverse effects. States that he did buy a new blood pressure machine and has checked it against the manual blood pressure to check to make sure that is accurate. Brought in documentation of his recent readings. Says that early morning it runs a little bit higher and it was 142/77. Later in the day 128/78 130/70 and 126/76. No complaints or concerns today.  No past medical history on file.   Home Meds: Outpatient Medications Prior to Visit  Medication Sig Dispense Refill  . fluticasone furoate-vilanterol (BREO ELLIPTA) 100-25 MCG/INH AEPB Inhale 1 puff into the lungs daily. 1 each 4  . losartan (COZAAR) 100 MG tablet Take 1 tablet (100 mg total) by mouth daily. 30 tablet 0  . benzonatate (TESSALON) 200 MG capsule Take 1 capsule (200 mg total) by mouth 3 (three) times daily as needed for cough. (Patient not taking: Reported on 03/23/2016) 30 capsule 1  . doxycycline (VIBRA-TABS) 100 MG tablet Take 1 tablet (100 mg total) by mouth 2 (two) times daily. (Patient not taking: Reported on 03/23/2016) 20 tablet 0  . predniSONE (DELTASONE) 20 MG tablet Take 1 tablet (20 mg total) by mouth daily with breakfast. (Patient not taking: Reported on 03/23/2016) 5 tablet 0  . VENTOLIN HFA 108 (90 Base) MCG/ACT inhaler Inhale 2 puffs into the lungs every 4 (four) hours  as needed for shortness of breath. (Patient not taking: Reported on 03/23/2016) 1 Inhaler 6   No facility-administered medications prior to visit.     Allergies:  Allergies  Allergen Reactions  . Penicillins         Social History   Social History  . Marital status: Married    Spouse name: N/A  . Number of children: N/A  . Years of education: N/A   Occupational History  . Not on file.   Social History Main Topics  . Smoking status: Current Some Day Smoker  . Smokeless tobacco: Never Used  . Alcohol use No  . Drug use: No  . Sexual activity: Not on file  Other Topics Concern  . Not on file   Social History Narrative  . No narrative on file    Family History  Problem Relation Age of Onset  . Hypertension Mother   . Arthritis Father   . Stroke Father   . Diabetes Maternal Grandmother      Review of Systems:  See HPI for pertinent ROS. All other ROS negative.    Physical Exam: Blood pressure 140/80, pulse 74, temperature 97.7 F (36.5 C), temperature source Oral, resp. rate 18, weight 191 lb (86.6 kg)., Body mass index is 24.52 kg/m. General:  WNWD WM. Appears in no acute distress. Neck: Supple. No thyromegaly. No lymphadenopathy. No carotid bruit. Lungs: Distant, decreased breath sounds throughout bilaterally. No wheezes rhonchi or rales. Heart: RRR with S1 S2. No murmurs, rubs, or gallops. Musculoskeletal:  Strength and tone normal for age. Extremities/Skin: Warm and dry. No LE edema.  Neuro: Alert and oriented X 3. Moves all extremities spontaneously. Gait is normal. CNII-XII grossly in tact. Psych:  Responds to questions appropriately with a normal affect.     ASSESSMENT AND PLAN:  [age] y.o. year old male with    1. Essential hypertension He is getting excellent blood pressure readings with his checking at home. Continue current dose of medication. Losartan 100 mg daily. Will check bmet to monitor.  If things remain stable he can wait 6 months for  follow-up office visit. Follow-up sooner if needed.    Murray HodgkinsSigned, Dev Dhondt Beth Ros[place], [place], [facility] 03/23/2016 10:24 AM

## 2016-04-25 DIAGNOSIS — M5412 Radiculopathy, cervical region: Secondary | ICD-10-CM | POA: Diagnosis not present

## 2016-04-25 DIAGNOSIS — M9901 Segmental and somatic dysfunction of cervical region: Secondary | ICD-10-CM | POA: Diagnosis not present

## 2016-05-16 DIAGNOSIS — M5412 Radiculopathy, cervical region: Secondary | ICD-10-CM | POA: Diagnosis not present

## 2016-05-16 DIAGNOSIS — M9901 Segmental and somatic dysfunction of cervical region: Secondary | ICD-10-CM | POA: Diagnosis not present

## 2016-06-07 DIAGNOSIS — M9901 Segmental and somatic dysfunction of cervical region: Secondary | ICD-10-CM | POA: Diagnosis not present

## 2016-06-07 DIAGNOSIS — M5412 Radiculopathy, cervical region: Secondary | ICD-10-CM | POA: Diagnosis not present

## 2016-06-15 DIAGNOSIS — M5412 Radiculopathy, cervical region: Secondary | ICD-10-CM | POA: Diagnosis not present

## 2016-06-15 DIAGNOSIS — M9901 Segmental and somatic dysfunction of cervical region: Secondary | ICD-10-CM | POA: Diagnosis not present

## 2016-07-05 DIAGNOSIS — M5412 Radiculopathy, cervical region: Secondary | ICD-10-CM | POA: Diagnosis not present

## 2016-07-05 DIAGNOSIS — M9901 Segmental and somatic dysfunction of cervical region: Secondary | ICD-10-CM | POA: Diagnosis not present

## 2016-07-17 DIAGNOSIS — M9901 Segmental and somatic dysfunction of cervical region: Secondary | ICD-10-CM | POA: Diagnosis not present

## 2016-07-17 DIAGNOSIS — M5412 Radiculopathy, cervical region: Secondary | ICD-10-CM | POA: Diagnosis not present

## 2016-07-31 DIAGNOSIS — M5442 Lumbago with sciatica, left side: Secondary | ICD-10-CM | POA: Diagnosis not present

## 2016-07-31 DIAGNOSIS — M9903 Segmental and somatic dysfunction of lumbar region: Secondary | ICD-10-CM | POA: Diagnosis not present

## 2016-08-03 DIAGNOSIS — M5442 Lumbago with sciatica, left side: Secondary | ICD-10-CM | POA: Diagnosis not present

## 2016-08-03 DIAGNOSIS — M9903 Segmental and somatic dysfunction of lumbar region: Secondary | ICD-10-CM | POA: Diagnosis not present

## 2016-08-09 DIAGNOSIS — M9903 Segmental and somatic dysfunction of lumbar region: Secondary | ICD-10-CM | POA: Diagnosis not present

## 2016-08-09 DIAGNOSIS — M5442 Lumbago with sciatica, left side: Secondary | ICD-10-CM | POA: Diagnosis not present

## 2016-08-26 ENCOUNTER — Other Ambulatory Visit: Payer: Self-pay | Admitting: Physician Assistant

## 2016-08-26 DIAGNOSIS — J439 Emphysema, unspecified: Secondary | ICD-10-CM

## 2016-08-28 NOTE — Telephone Encounter (Signed)
Refill appropriate 

## 2016-09-06 DIAGNOSIS — M9903 Segmental and somatic dysfunction of lumbar region: Secondary | ICD-10-CM | POA: Diagnosis not present

## 2016-09-06 DIAGNOSIS — M5442 Lumbago with sciatica, left side: Secondary | ICD-10-CM | POA: Diagnosis not present

## 2016-09-20 ENCOUNTER — Ambulatory Visit: Payer: Medicare Other | Admitting: Physician Assistant

## 2016-09-27 ENCOUNTER — Ambulatory Visit: Payer: Self-pay | Admitting: Physician Assistant

## 2016-09-28 DIAGNOSIS — M9903 Segmental and somatic dysfunction of lumbar region: Secondary | ICD-10-CM | POA: Diagnosis not present

## 2016-09-28 DIAGNOSIS — M5442 Lumbago with sciatica, left side: Secondary | ICD-10-CM | POA: Diagnosis not present

## 2016-10-09 ENCOUNTER — Ambulatory Visit: Payer: Medicare Other | Admitting: Physician Assistant

## 2016-10-16 ENCOUNTER — Encounter: Payer: Self-pay | Admitting: Physician Assistant

## 2016-10-16 ENCOUNTER — Ambulatory Visit (INDEPENDENT_AMBULATORY_CARE_PROVIDER_SITE_OTHER): Payer: Medicare Other | Admitting: Physician Assistant

## 2016-10-16 VITALS — BP 200/110 | HR 85 | Temp 97.8°F | Resp 16 | Wt 195.0 lb

## 2016-10-16 DIAGNOSIS — I10 Essential (primary) hypertension: Secondary | ICD-10-CM

## 2016-10-16 DIAGNOSIS — J439 Emphysema, unspecified: Secondary | ICD-10-CM

## 2016-10-16 DIAGNOSIS — J988 Other specified respiratory disorders: Secondary | ICD-10-CM | POA: Diagnosis not present

## 2016-10-16 DIAGNOSIS — B9689 Other specified bacterial agents as the cause of diseases classified elsewhere: Secondary | ICD-10-CM

## 2016-10-16 DIAGNOSIS — J441 Chronic obstructive pulmonary disease with (acute) exacerbation: Secondary | ICD-10-CM

## 2016-10-16 LAB — BASIC METABOLIC PANEL WITH GFR
BUN: 21 mg/dL (ref 7–25)
CO2: 27 mmol/L (ref 20–31)
Calcium: 9.4 mg/dL (ref 8.6–10.3)
Chloride: 105 mmol/L (ref 98–110)
Creat: 1.15 mg/dL (ref 0.70–1.25)
GFR, EST AFRICAN AMERICAN: 75 mL/min (ref >=60)
GFR, Est Non African American: 65 mL/min (ref >=60)
GLUCOSE: 114 mg/dL — AB (ref 70–99)
Potassium: 5.6 mmol/L — ABNORMAL HIGH (ref 3.5–5.3)
Sodium: 139 mmol/L (ref 135–146)

## 2016-10-16 MED ORDER — VENTOLIN HFA 108 (90 BASE) MCG/ACT IN AERS: INHALATION_SPRAY | RESPIRATORY_TRACT | 3 refills | Status: DC

## 2016-10-16 MED ORDER — DOXYCYCLINE HYCLATE 100 MG PO TABS: 100.0000 mg | ORAL_TABLET | Freq: Two times a day (BID) | ORAL | 0 refills | Status: DC

## 2016-10-16 MED ORDER — LOSARTAN POTASSIUM 100 MG PO TABS: 100.0000 mg | ORAL_TABLET | Freq: Every day | ORAL | 1 refills | Status: DC

## 2016-10-16 MED ORDER — AMLODIPINE BESYLATE 5 MG PO TABS: ORAL_TABLET | ORAL | 0 refills | Status: DC

## 2016-10-16 MED ORDER — FLUTICASONE FUROATE-VILANTEROL 100-25 MCG/INH IN AEPB: 1.0000 | INHALATION_SPRAY | Freq: Every day | RESPIRATORY_TRACT | 11 refills | Status: DC

## 2016-10-16 MED ORDER — PREDNISONE 20 MG PO TABS: 20.0000 mg | ORAL_TABLET | Freq: Every day | ORAL | 0 refills | Status: DC

## 2016-10-16 NOTE — Progress Notes (Signed)
Patient ID: Eric Spencer MRN: [id number], DOB: July 11, 1945, 71 y.o. Date of Encounter: @  Chief Complaint:  Chief Complaint  Patient presents with  . Hypertension    6 month f/u  . allergy problems    HPI: 71 y.o. year old white male    01/13/2016: presents as a new patient to establish care.   He states that he was originally from [place]. [place]. At that time moved back to [place] area. Grandchildren live there.   Says that he used to see Dr. Megan Mans in [place] until he retired. Says at times he would see him and would be given antibiotics and inhaler but patient says he was never told the diagnosis "COPD."  Says that this past March he went to the hospital and had positive test for flu. Was told that he had "COPD". Was given inhaler medication to use.  Says that after that he was going to establish with a new primary care provider. However he says that that doctor just said quit smoking and walked out. Gave him no prescription medicines.  He says that over the years it would be times that he would be --- mowing the grass or getting up leaves---- that would cause him to go to the doctor and get medications.  He says that over the past 2 weeks he has been decreasing his smoking and is trying to quit. Monday and Tuesday of this week he only smoked 1 cigarette in the morning and 1 in the evening. He smoked 3 cigarettes today.  Says that he started smoking around college. Has quit twice. ~ 3 years ago quit for about one year. The second time he quit he didn't stay quit for long. Says that most of these years he has smoked on average about 1 pack per day. Says that it is very difficult to quit and is interested in using a medicine to help him quit.  Also discussed his blood pressure is elevated today. Discussed going ahead and starting  medication today. However he would prefer to come back here for 1 more blood pressure check prior to starting meds for this. Says that if it is high on recheck that he is agreeable to take medication but wants to wait and make sure since this is his first visit.  Reports that he has noticed that he has decreased sensation on the bottoms of his toes on both feet.  He has noticed this since about March.  He drinks no alcohol.  No other complaints or concerns today.  AT THAT OV-- Refused to start blood pressure medication at that time. Instead, planned that he would return for follow-up visit in 1-2 weeks to recheck blood pressure. He came on 01/27/16 and had nurse blood pressure check. She got blood pressure readings elevated at 160/78 and 168/84. Then scheduled to see Dr. Tanya Nones for the following day. He had visit with Dr. Tanya Nones on 01/28/16.  At that time he started losartan 100 mg daily.  02/21/2016: Today patient reports that he did not start the losartan. Says that the pharmacist made a comment that that was a really high dose to start with. His friends also made the same remark--so then he was scared to take it. Afraid that it was "too much ". Says that he would be agreeable to take the 50 mg tablet but  was scared to take the 100 mg. As well he bought a blood pressure machine and has been checking his blood pressure and writing down his readings and he brings that in today. All of his readings he has documented are 130s over 70s to 140s over 80s. Such as 132/75 140/78 136/74. He also says that he is having coughing and symptoms just like he had in the past. Says it's just like before-- he mowed the grass on Thursday and then "it hit me Friday "coughing up thick dark green phlegm. Has some drainage from his nose. Throat feels scratchy. Not getting much sleep secondary to cough. Has used his albuterol inhaler multiple times yesterday. Feels like he has a lot of congestion in his chest and he can't get  out. No fevers or chills. No other complaints or concerns today.  AT THAT OV---Rxed:  Losartan  1 po QD Doxy  BID x 10d Pred  QD x 5 d  03/09/2016: He reports that he has been taking the losartan 50 mg daily. Having no adverse effects. Says he plans to go to [place] and buy him a new blood pressure monitor that is accurate. Reports that he did take the doxycycline and prednisone as directed. Reports that his chest congestion and increased cough have resolved and have returned to his baseline. No longer coughing up thick dark phlegm. AT THAT OV: Increased Losartan to  QD  03/23/2016: He reports he has been taking the losartan 100 mg daily. Having no adverse effects. States that he did buy a new blood pressure machine and has checked it against the manual blood pressure to check to make sure that is accurate. Brought in documentation of his recent readings. Says that early morning it runs a little bit higher and it was 142/77. Later in the day 128/78 130/70 and 126/76. No complaints or concerns today.   10/16/2016: He reports that he is taking the blood pressure medication daily. Losartan 100 mg. Says that he has not been checking his blood pressure at home. Says that he has been having increased congestion for the past 3 or 4 weeks. Is using the [facility] and is also using Mucinex but symptoms not resolving. Coughing up increased amount of phlegm compared to his usual. Also feels congested in his chest and his head and nose. Says that he has pretty much quit smoking completely. Says that his friend died in 07/24/2022 and he smoked a few cigarettes when he was upset with that but otherwise has quit. No other complaints or concerns today.    No past medical history on file.   Home Meds: Outpatient Medications Prior to Visit  Medication Sig Dispense Refill  . fluticasone furoate-vilanterol (BREO ELLIPTA) 100-25 MCG/INH AEPB Inhale 1 puff into the lungs daily. 1 each 4   . losartan (COZAAR) 100 MG tablet Take 1 tablet (100 mg total) by mouth daily. 30 tablet 0  . VENTOLIN HFA 108 (90 Base) MCG/ACT inhaler INHALE 2 PUFFS INTO THE LUNGS EVERY 4 HOURS AS NEEDED. 18 g 1  . benzonatate (TESSALON) 200 MG capsule Take 1 capsule (200 mg total) by mouth 3 (three) times daily as needed for cough. (Patient not taking: Reported on 03/23/2016) 30 capsule 1  . doxycycline (VIBRA-TABS) 100 MG tablet Take 1 tablet (100 mg total) by mouth 2 (two) times daily. (Patient not taking: Reported on 03/23/2016) 20 tablet 0  . predniSONE (DELTASONE) 20 MG tablet Take 1 tablet (20 mg total) by mouth daily with  breakfast. (Patient not taking: Reported on 03/23/2016) 5 tablet 0   No facility-administered medications prior to visit.     Allergies:  Allergies  Allergen Reactions  . Penicillins         Social History   Social History  . Marital status: Married    Spouse name: N/A  . Number of children: N/A  . Years of education: N/A   Occupational History  . Not on file.   Social History Main Topics  . Smoking status: Current Some Day Smoker  . Smokeless tobacco: Never Used  . Alcohol use No  . Drug use: No  . Sexual activity: Not on file   Other Topics Concern  . Not on file   Social History Narrative  . No narrative on file    Family History  Problem Relation Age of Onset  . Hypertension Mother   . Arthritis Father   . Stroke Father   . Diabetes Maternal Grandmother      Review of Systems:  See HPI for pertinent ROS. All other ROS negative.    Physical Exam: Blood pressure (!) 200/110, pulse 85, temperature 97.8 F (36.6 C), temperature source Oral, resp. rate 16, weight 195 lb (88.5 kg), SpO2 98 %., There is no height or weight on file to calculate BMI. General:  WNWD WM. Appears in no acute distress. Neck: Supple. No thyromegaly. No lymphadenopathy. No carotid bruit. Lungs: Distant, decreased breath sounds throughout bilaterally. Slight wheezes at bases  bilaterally but very slight. Heart: RRR with S1 S2. No murmurs, rubs, or gallops. Musculoskeletal:  Strength and tone normal for age. Extremities/Skin: Warm and dry. No LE edema.  Neuro: Alert and oriented X 3. Moves all extremities spontaneously. Gait is normal. CNII-XII grossly in tact. Psych:  Responds to questions appropriately with a normal affect.     ASSESSMENT AND PLAN:  71 y.o. year old male with    1. Essential hypertension Blood pressure is significantly high. He says that he is compliant with taking that losartan 100 mg daily. He says that he thinks it is reading high today because of white coat hypertension--says he "always has this problem"-- but I rechecked blood pressure again at the end of the visit-- as he requests-- and it is still reading similar to above reading. I had a long discussion with him that hypertension is often called the silent killer because it does not cause symptoms until it is too late. Had discussion of why I highly recommended that he add these medicines and take as directed. He is to add Norvasc-- will take 5 mg daily for 5 days then increase to taking 2 daily for 10 mg.  Will return for follow-up visit in 2 weeks.  He has not been checking blood pressure at home but says that he will start checking it at home and will bring those readings in with him to follow-up visit.  Also cont the losartan 100 mg daily. Will check labs today to monitor this. - amLODipine (NORVASC) 5 MG tablet; Take 1 daily for 5 days then increase to 2 daily.  Dispense: 30 tablet; Refill: 0 - BASIC METABOLIC PANEL WITH GFR  2. Pulmonary emphysema, unspecified emphysema type (HCC) He is to continue the [place]. Add doxycycline and prednisone for exacerbation. - doxycycline (VIBRA-TABS) 100 MG tablet; Take 1 tablet (100 mg total) by mouth 2 (two) times daily.  Dispense: 20 tablet; Refill: 0 - predniSONE (DELTASONE) 20 MG tablet; Take 1 tablet (20 mg total) by mouth  daily with  breakfast.  Dispense: 5 tablet; Refill: 0  3. COPD exacerbation (HCC) He is to continue the [place]. Add doxycycline and prednisone for exacerbation. - doxycycline (VIBRA-TABS) 100 MG tablet; Take 1 tablet (100 mg total) by mouth 2 (two) times daily.  Dispense: 20 tablet; Refill: 0 - predniSONE (DELTASONE) 20 MG tablet; Take 1 tablet (20 mg total) by mouth daily with breakfast.  Dispense: 5 tablet; Refill: 0   Follow-up visit 2 weeks or sooner if needed.      Signed, [address] [place], [place], [facility] 10/16/2016 10:18 AM

## 2016-10-18 ENCOUNTER — Ambulatory Visit (INDEPENDENT_AMBULATORY_CARE_PROVIDER_SITE_OTHER): Payer: Medicare Other | Admitting: Orthopaedic Surgery

## 2016-10-19 DIAGNOSIS — M5442 Lumbago with sciatica, left side: Secondary | ICD-10-CM | POA: Diagnosis not present

## 2016-10-19 DIAGNOSIS — M9903 Segmental and somatic dysfunction of lumbar region: Secondary | ICD-10-CM | POA: Diagnosis not present

## 2016-10-24 ENCOUNTER — Telehealth: Payer: Self-pay

## 2016-10-24 NOTE — Telephone Encounter (Signed)
Patient called and states his symptoms started to get better when taking the PredniSone, but soon after completed it his symptoms started back up and is requesting another round of antibiotics

## 2016-10-25 ENCOUNTER — Ambulatory Visit (INDEPENDENT_AMBULATORY_CARE_PROVIDER_SITE_OTHER): Payer: Medicare Other

## 2016-10-25 ENCOUNTER — Ambulatory Visit (INDEPENDENT_AMBULATORY_CARE_PROVIDER_SITE_OTHER): Payer: Medicare Other | Admitting: Orthopaedic Surgery

## 2016-10-25 ENCOUNTER — Encounter (INDEPENDENT_AMBULATORY_CARE_PROVIDER_SITE_OTHER): Payer: Self-pay | Admitting: Orthopaedic Surgery

## 2016-10-25 VITALS — BP 136/89 | HR 96 | Ht 74.0 in | Wt 195.0 lb

## 2016-10-25 DIAGNOSIS — G8929 Other chronic pain: Secondary | ICD-10-CM

## 2016-10-25 DIAGNOSIS — M25512 Pain in left shoulder: Secondary | ICD-10-CM

## 2016-10-25 MED ORDER — BUPIVACAINE HCL 0.5 % IJ SOLN
2.0000 mL | INTRAMUSCULAR | Status: AC | PRN
  Administered 2016-10-25: 2 mL via INTRA_ARTICULAR

## 2016-10-25 MED ORDER — METHYLPREDNISOLONE ACETATE 40 MG/ML IJ SUSP
80.0000 mg | INTRAMUSCULAR | Status: AC | PRN
  Administered 2016-10-25: 80 mg

## 2016-10-25 MED ORDER — LIDOCAINE HCL 1 % IJ SOLN
2.0000 mL | INTRAMUSCULAR | Status: AC | PRN
Start: 2016-10-25 — End: 2016-10-25
  Administered 2016-10-25: 2 mL

## 2016-10-25 NOTE — Telephone Encounter (Signed)
Spoke with patient he states he has sore throat , head is stopped up and is having SOB. Patient left a message on 5/1. Patient states he still has about 4 pills of the doxycycline but is not feeling any better and has finished the prednisone   Pls see note below as well

## 2016-10-25 NOTE — Telephone Encounter (Signed)
Spoke with patient he has made an appointment for 5/3 at 10:15

## 2016-10-25 NOTE — Progress Notes (Signed)
Office Visit Note   Patient: Eric Spencer           Date of Birth: 06-21-1946           MRN: [id number] Visit Date: 10/25/2016              Requested by: Dorena Bodo, PA-C [address] [place] [address] PCP: Frazier Richards, PA-C   Assessment & Plan: Visit Diagnoses:  1. Chronic left shoulder pain   End-stage osteoarthritis left shoulder  Plan: Cortisone injection and glenohumeral joint left shoulder. Long discussion regarding treatment options. Eric Spencer still smokes and that will be an issue over time in terms of the shoulder. He is aware that. He is also aware that we need to limit the injections  Follow-Up Instructions: No Follow-up on file.   Orders:  Orders Placed This Encounter  Procedures  . XR Shoulder Left   No orders of the defined types were placed in this encounter.     Procedures: Large Joint Inj Date/Time: 10/25/2016 12:50 PM Performed by: Valeria Batman Authorized by: Valeria Batman   Consent Given by:  Patient Timeout: prior to procedure the correct patient, procedure, and site was verified   Indications:  Pain Location:  Shoulder Site:  L glenohumeral Prep: patient was prepped and draped in usual sterile fashion   Needle Size:  25 G Needle Length:  1.5 inches Approach:  Posterior Ultrasound Guidance: No   Fluoroscopic Guidance: No   Arthrogram: No   Medications:  2 mL lidocaine 1 %; 2 mL bupivacaine 0.5 %; 80 mg methylPREDNISolone acetate 40 MG/ML Aspiration Attempted: No   Patient tolerance:  Patient tolerated the procedure well with no immediate complications     Clinical Data: No additional findings.   Subjective: Chief Complaint  Patient presents with  . Right Shoulder - Pain    Eric Spencer is a 71 y o that presents with  bilateral shoulder pain x 3 years. Pt relates he received a cortisone injection in right shoulder in 10/2015 and it really helped. Wants one today. Went to PT at [facility].  . Left  Shoulder - Pain  Eric Spencer has evidence of significant degenerative changes i.e. osteoarthritis in both shoulders. He recently had an injection in his right shoulder "help. He will take as much as 800 mg of ibuprofen of the corset 24 hours it really does make a difference. On this occasion is having trouble on the left. Films demonstrate end-stage osteoarthritis  HPI  Review of Systems   Objective: Vital Signs: BP 136/89   Pulse 96   Ht  (1.88 m)   Wt 195 lb (88.5 kg)   BMI 25.04 kg/m   Physical Exam  Ortho Exam left shoulder exam with about 90 of passive abduction only about 60 of active abduction. Flexion to just over 90 actively and now much more passively. Lots of crepitation. Loss of internal and external rotation. Skin intact. Neurovascular exam intact.  Specialty Comments:  No specialty comments available.  Imaging: Xr Shoulder Left  Result Date: 10/25/2016 Films of the left shoulder came to projections. There is end-stage osteoarthritis of the glenohumeral joint. A large inferior humeral head spur. Flattening of the humeral head and loss of joint space. The space between the humeral head and the acromion. Some prominence of the distal chromium inferiorly and degenerative changes at the acromioclavicular joint    PMFS History: Patient Active Problem List   Diagnosis  Date Noted  . COPD (chronic obstructive pulmonary disease) (HCC) 01/13/2016  . Essential hypertension 01/13/2016  . Smoker 01/13/2016  . Peripheral neuropathy 01/13/2016   No past medical history on file.  Family History  Problem Relation Age of Onset  . Hypertension Mother   . Arthritis Father   . Stroke Father   . Diabetes Maternal Grandmother     Past Surgical History:  Procedure Laterality Date  . HERNIA REPAIR     Social History   Occupational History  . Not on file.   Social History Main Topics  . Smoking status: Current Some Day Smoker  . Smokeless tobacco: Never Used  .  Alcohol use No  . Drug use: No  . Sexual activity: Not on file     Valeria Batman, MD   Note - This record has been created using [employer].  Chart creation errors have been sought, but may not always  have been located. Such creation errors do not reflect on  the standard of medical care.

## 2016-10-25 NOTE — Telephone Encounter (Signed)
See if he can come in for a follow-up visit with me tomorrow so I can reexamine him and listen to his lungs.

## 2016-10-26 ENCOUNTER — Ambulatory Visit (INDEPENDENT_AMBULATORY_CARE_PROVIDER_SITE_OTHER): Payer: Medicare Other | Admitting: Physician Assistant

## 2016-10-26 ENCOUNTER — Encounter: Payer: Self-pay | Admitting: Physician Assistant

## 2016-10-26 VITALS — BP 150/90 | HR 88 | Temp 97.2°F | Resp 16 | Wt 189.4 lb

## 2016-10-26 DIAGNOSIS — J439 Emphysema, unspecified: Secondary | ICD-10-CM | POA: Diagnosis not present

## 2016-10-26 DIAGNOSIS — J441 Chronic obstructive pulmonary disease with (acute) exacerbation: Secondary | ICD-10-CM | POA: Diagnosis not present

## 2016-10-26 DIAGNOSIS — E875 Hyperkalemia: Secondary | ICD-10-CM | POA: Diagnosis not present

## 2016-10-26 DIAGNOSIS — I10 Essential (primary) hypertension: Secondary | ICD-10-CM

## 2016-10-26 LAB — BASIC METABOLIC PANEL WITH GFR
BUN: 25 mg/dL (ref 7–25)
CALCIUM: 9.1 mg/dL (ref 8.6–10.3)
CO2: 24 mmol/L (ref 20–31)
CREATININE: 1.2 mg/dL (ref 0.70–1.25)
Chloride: 102 mmol/L (ref 98–110)
GFR, EST AFRICAN AMERICAN: 71 mL/min (ref >=60)
GFR, Est Non African American: 61 mL/min (ref >=60)
Glucose, Bld: 125 mg/dL — ABNORMAL HIGH (ref 70–99)
Potassium: 4.7 mmol/L (ref 3.5–5.3)
SODIUM: 136 mmol/L (ref 135–146)

## 2016-10-26 MED ORDER — LEVOFLOXACIN 750 MG PO TABS: 750.0000 mg | ORAL_TABLET | Freq: Every day | ORAL | 0 refills | Status: DC

## 2016-10-26 MED ORDER — PREDNISONE 20 MG PO TABS: ORAL_TABLET | ORAL | 0 refills | Status: DC

## 2016-10-26 MED ORDER — AMLODIPINE BESYLATE 10 MG PO TABS: 10.0000 mg | ORAL_TABLET | Freq: Every day | ORAL | 3 refills | Status: DC

## 2016-10-26 NOTE — Progress Notes (Signed)
Patient ID: Eric Spencer MRN: [id number], DOB: March 16, 1946, 71 y.o. Date of Encounter: @DATE @  Chief Complaint:  Chief Complaint  Patient presents with  . breathing problems  . Cough  . Nasal Congestion    HPI: 71 y.o. year old white male    01/13/2016: presents as a new patient to establish care.   He states that he was originally from [name]. [place]. At that time moved back to [place] area. Grandchildren live there.   Says that he used to see Dr. Megan Mans in [place] until he retired. Says at times he would see him and would be given antibiotics and inhaler but patient says he was never told the diagnosis "COPD."  Says that this past March he went to the hospital and had positive test for flu. Was told that he had "COPD". Was given inhaler medication to use.  Says that after that he was going to establish with a new primary care provider. However he says that that doctor just said quit smoking and walked out. Gave him no prescription medicines.  He says that over the years it would be times that he would be --- mowing the grass or getting up leaves---- that would cause him to go to the doctor and get medications.  He says that over the past 2 weeks he has been decreasing his smoking and is trying to quit. Monday and Tuesday of this week he only smoked 1 cigarette in the morning and 1 in the evening. He smoked 3 cigarettes today.  Says that he started smoking around college. Has quit twice. ~ 3 years ago quit for about one year. The second time he quit he didn't stay quit for long. Says that most of these years he has smoked on average about 1 pack per day. Says that it is very difficult to quit and is interested in using a medicine to help him quit.  Also discussed his blood pressure is elevated today. Discussed going ahead and starting  medication today. However he would prefer to come back here for 1 more blood pressure check prior to starting meds for this. Says that if it is high on recheck that he is agreeable to take medication but wants to wait and make sure since this is his first visit.  Reports that he has noticed that he has decreased sensation on the bottoms of his toes on both feet.  He has noticed this since about March.  He drinks no alcohol.  No other complaints or concerns today.  AT THAT OV-- Refused to start blood pressure medication at that time. Instead, planned that he would return for follow-up visit in 1-2 weeks to recheck blood pressure. He came on 01/27/16 and had nurse blood pressure check. She got blood pressure readings elevated at 160/78 and 168/84. Then scheduled to see Dr. Tanya Nones for the following day. He had visit with Dr. Tanya Nones on 01/28/16.  At that time he started losartan 100 mg daily.  02/21/2016: Today patient reports that he did not start the losartan. Says that the pharmacist made a comment that that was a really high dose to start with. His friends also made the same remark--so then he was scared to take it. Afraid that it was "too much ". Says that he would be agreeable to take the 50 mg tablet but was scared  to take the 100 mg. As well he bought a blood pressure machine and has been checking his blood pressure and writing down his readings and he brings that in today. All of his readings he has documented are 130s over 70s to 140s over 80s. Such as 132/75 140/78 136/74. He also says that he is having coughing and symptoms just like he had in the past. Says it's just like before-- he mowed the grass on Thursday and then "it hit me Friday "coughing up thick dark green phlegm. Has some drainage from his nose. Throat feels scratchy. Not getting much sleep secondary to cough. Has used his albuterol inhaler multiple times yesterday. Feels like he has a lot of congestion in his chest and he can't get  out. No fevers or chills. No other complaints or concerns today.  AT THAT OV---Rxed:  Losartan 50mg  1 po QD Doxy 100mg  BID x 10d Pred 20mg  QD x 5 d  03/09/2016: He reports that he has been taking the losartan 50 mg daily. Having no adverse effects. Says he plans to go to [place] and buy him a new blood pressure monitor that is accurate. Reports that he did take the doxycycline and prednisone as directed. Reports that his chest congestion and increased cough have resolved and have returned to his baseline. No longer coughing up thick dark phlegm. AT THAT OV: Increased Losartan to 100mg  QD  03/23/2016: He reports he has been taking the losartan 100 mg daily. Having no adverse effects. States that he did buy a new blood pressure machine and has checked it against the manual blood pressure to check to make sure that is accurate. Brought in documentation of his recent readings. Says that early morning it runs a little bit higher and it was 142/77. Later in the day 128/78 130/70 and 126/76. No complaints or concerns today.   10/16/2016: He reports that he is taking the blood pressure medication daily. Losartan 100 mg. Says that he has not been checking his blood pressure at home. Says that he has been having increased congestion for the past 3 or 4 weeks. Is using the [facility] and is also using Mucinex but symptoms not resolving. Coughing up increased amount of phlegm compared to his usual. Also feels congested in his chest and his head and nose. Says that he has pretty much quit smoking completely. Says that his friend died in July 25, 2022 and he smoked a few cigarettes when he was upset with that but otherwise has quit. No other complaints or concerns today.  AT THAT OV: Added Norvasc-- will take 5 mg daily for 5 days then increase to taking 2 daily for 10 mg.  Also cont the losartan 100 mg daily.  Also checked BMET  to monitor this. Potassium, came back high at 5.6.  He was to continue  the [place]. Add doxycycline 100mg  BID x 10days and prednisone 20mg  1 daily for 5 days.    10/26/2016: Today he reports that he did add the Norvasc as directed at last visit. Has increased to taking 2 daily. Is having no adverse effects with this. He reports that he finished the prednisone around Friday and was feeling good. Says then on Saturday he and his wife went outside working and there was a lot of pollen. However they also were around their granddaughter who had been sick. All of the sudden both patient and his wife got sick. They state that the wife went and saw her doctor earlier this week  and was prescribed antibiotics and she says that she is feeling much better after getting on the antibiotics. Patient states that now he is feeling even worse than he did at last visit. Having runny nose and cough. Has had no known fever. No significant sore throat.    No past medical history on file.   Home Meds: Outpatient Medications Prior to Visit  Medication Sig Dispense Refill  . amLODipine (NORVASC) 5 MG tablet Take 1 daily for 5 days then increase to 2 daily. 30 tablet 0  . BREO ELLIPTA 200-25 MCG/INH AEPB   10  . losartan (COZAAR) 100 MG tablet Take 1 tablet (100 mg total) by mouth daily. 90 tablet 1  . VENTOLIN HFA 108 (90 Base) MCG/ACT inhaler INHALE 2 PUFFS INTO THE LUNGS EVERY 4 HOURS AS NEEDED. 18 g 3  . doxycycline (VIBRA-TABS) 100 MG tablet Take 1 tablet (100 mg total) by mouth 2 (two) times daily. (Patient not taking: Reported on 10/25/2016) 20 tablet 0  . predniSONE (DELTASONE) 20 MG tablet Take 1 tablet (20 mg total) by mouth daily with breakfast. (Patient not taking: Reported on 10/25/2016) 5 tablet 0   No facility-administered medications prior to visit.     Allergies:  Allergies  Allergen Reactions  . Penicillins         Social History   Social History  . Marital status: Married    Spouse name: N/A  . Number of children: N/A  . Years of education: N/A   Occupational  History  . Not on file.   Social History Main Topics  . Smoking status: Current Some Day Smoker  . Smokeless tobacco: Never Used  . Alcohol use No  . Drug use: No  . Sexual activity: Not on file   Other Topics Concern  . Not on file   Social History Narrative  . No narrative on file    Family History  Problem Relation Age of Onset  . Hypertension Mother   . Arthritis Father   . Stroke Father   . Diabetes Maternal Grandmother      Review of Systems:  See HPI for pertinent ROS. All other ROS negative.    Physical Exam: Blood pressure (!) 150/90, pulse 88, temperature 97.2 F (36.2 C), temperature source Oral, resp. rate 16, weight 189 lb 6.4 oz (85.9 kg), SpO2 95 %., Body mass index is 24.32 kg/m. General:  WNWD WM. Appears in no acute distress. Neck: Supple. No thyromegaly. No lymphadenopathy. No carotid bruit. Lungs: Distant, decreased breath sounds throughout bilaterally. Mild wheezes throughout bilaterally. Heart: RRR with S1 S2. No murmurs, rubs, or gallops. Musculoskeletal:  Strength and tone normal for age. Extremities/Skin: Warm and dry. No LE edema.  Neuro: Alert and oriented X 3. Moves all extremities spontaneously. Gait is normal. CNII-XII grossly in tact. Psych:  Responds to questions appropriately with a normal affect.     ASSESSMENT AND PLAN:  71 y.o. year old male with   1. Essential hypertension Will go ahead and change his prescription from taking 2 of the 5 mg Norvasc to taking Norvasc 10 mg 1 a day. He will also continue the losartan. Lab of 10/16/16 showed potassium 5.6. This may have been secondary to hemolysis. Will repeat lab now. - amLODipine (NORVASC) 10 MG tablet; Take 1 tablet (10 mg total) by mouth daily.  Dispense: 30 tablet; Refill: 3 - BASIC METABOLIC PANEL WITH GFR  2. Hyperkalemia Lab of 10/16/16 showed potassium 5.6. This may have been secondary to  hemolysis. Will repeat lab now. - BASIC METABOLIC PANEL WITH GFR  3. Pulmonary  emphysema, unspecified emphysema type (HCC)  4. COPD exacerbation (HCC) At this time will use Levaquin and prednisone in addition to his current COPD meds. Follow-up if breathing worsens or does not return to normal baseline upon completion of these. - levofloxacin (LEVAQUIN) 750 MG tablet; Take 1 tablet (750 mg total) by mouth daily.  Dispense: 7 tablet; Refill: 0 - predniSONE (DELTASONE) 20 MG tablet; Take 2 daily for 4 days then take 1 daily for 2 days  Dispense: 10 tablet; Refill: 0       Signed, [address], [place], [facility] 10/26/2016 10:20 AM

## 2016-10-30 ENCOUNTER — Ambulatory Visit (INDEPENDENT_AMBULATORY_CARE_PROVIDER_SITE_OTHER): Payer: Medicare Other | Admitting: Physician Assistant

## 2016-10-30 ENCOUNTER — Ambulatory Visit
Admission: RE | Admit: 2016-10-30 | Discharge: 2016-10-30 | Disposition: A | Discharge status: Home / Self Care | Payer: Medicare Other | Source: Ambulatory Visit | Attending: Physician Assistant | Admitting: Physician Assistant

## 2016-10-30 ENCOUNTER — Encounter: Payer: Self-pay | Admitting: Physician Assistant

## 2016-10-30 ENCOUNTER — Ambulatory Visit: Payer: Self-pay | Admitting: Physician Assistant

## 2016-10-30 ENCOUNTER — Telehealth: Payer: Self-pay

## 2016-10-30 ENCOUNTER — Ambulatory Visit: Payer: Medicare Other | Admitting: Physician Assistant

## 2016-10-30 VITALS — BP 190/92 | HR 105 | Temp 97.7°F | Resp 18 | Wt 188.6 lb

## 2016-10-30 DIAGNOSIS — J441 Chronic obstructive pulmonary disease with (acute) exacerbation: Secondary | ICD-10-CM

## 2016-10-30 DIAGNOSIS — R0602 Shortness of breath: Secondary | ICD-10-CM | POA: Diagnosis not present

## 2016-10-30 MED ORDER — METHYLPREDNISOLONE ACETATE 40 MG/ML IJ SUSP
60.0000 mg | Freq: Once | INTRAMUSCULAR | Status: AC
  Administered 2016-10-30: 60 mg via INTRAMUSCULAR

## 2016-10-30 MED ORDER — IPRATROPIUM-ALBUTEROL 0.5-2.5 (3) MG/3ML IN SOLN
3.0000 mL | Freq: Once | RESPIRATORY_TRACT | Status: AC
  Administered 2016-10-30: 3 mL via RESPIRATORY_TRACT

## 2016-10-30 NOTE — Telephone Encounter (Signed)
Patient was seen in office on 5/3 having SOB. Patient called and states he is not feeling any better he called EMS over the weekend patient states they hooked him up to the 02 tank and told him a nebulizer treatment would help him.  Patient states his inhalers are not working and is requesting an RX be sent in for a nebulizer machine. I Will have patient come in today to be seen since he is having a hard time breathing.Patient seems to think the Prednisone is causing him to not have a lot of energy

## 2016-10-30 NOTE — Progress Notes (Signed)
Patient ID: Eric Spencer MRN: [id number], DOB: [birth year], 71 y.o. Date of Encounter: @DATE @  Chief Complaint:  Chief Complaint  Patient presents with  . Shortness of Breath    HPI: 71 y.o. year old white male    01/13/2016: presents as a new patient to establish care.   He states that he was originally from [place]. [place]. At that time moved back to [place] area. Grandchildren live there.   Says that he used to see Dr. Megan Mans in [place] until he retired. Says at times he would see him and would be given antibiotics and inhaler but patient says he was never told the diagnosis "COPD."  Says that this past March he went to the hospital and had positive test for flu. Was told that he had "COPD". Was given inhaler medication to use.  Says that after that he was going to establish with a new primary care provider. However he says that that doctor just said quit smoking and walked out. Gave him no prescription medicines.  He says that over the years it would be times that he would be --- mowing the grass or getting up leaves---- that would cause him to go to the doctor and get medications.  He says that over the past 2 weeks he has been decreasing his smoking and is trying to quit. Monday and Tuesday of this week he only smoked 1 cigarette in the morning and 1 in the evening. He smoked 3 cigarettes today.  Says that he started smoking around college. Has quit twice. ~ 3 years ago quit for about one year. The second time he quit he didn't stay quit for long. Says that most of these years he has smoked on average about 1 pack per day. Says that it is very difficult to quit and is interested in using a medicine to help him quit.  Also discussed his blood pressure is elevated today. Discussed going ahead and starting medication today. However he would  prefer to come back here for 1 more blood pressure check prior to starting meds for this. Says that if it is high on recheck that he is agreeable to take medication but wants to wait and make sure since this is his first visit.  Reports that he has noticed that he has decreased sensation on the bottoms of his toes on both feet.  He has noticed this since about March.  He drinks no alcohol.  No other complaints or concerns today.  AT THAT OV-- Refused to start blood pressure medication at that time. Instead, planned that he would return for follow-up visit in 1-2 weeks to recheck blood pressure. He came on 01/27/16 and had nurse blood pressure check. She got blood pressure readings elevated at 160/78 and 168/84. Then scheduled to see Dr. Tanya Nones for the following day. He had visit with Dr. Tanya Nones on 01/28/16.  At that time he started losartan 100 mg daily.  02/21/2016: Today patient reports that he did not start the losartan. Says that the pharmacist made a comment that that was a really high dose to start with. His friends also made the same remark--so then he was scared to take it. Afraid that it was "too much ". Says that he would be agreeable to take the 50 mg tablet but was scared to take the 100 mg. As  well he bought a blood pressure machine and has been checking his blood pressure and writing down his readings and he brings that in today. All of his readings he has documented are 130s over 70s to 140s over 80s. Such as 132/75 140/78 136/74. He also says that he is having coughing and symptoms just like he had in the past. Says it's just like before-- he mowed the grass on Thursday and then "it hit me Friday "coughing up thick dark green phlegm. Has some drainage from his nose. Throat feels scratchy. Not getting much sleep secondary to cough. Has used his albuterol inhaler multiple times yesterday. Feels like he has a lot of congestion in his chest and he can't get out. No fevers or chills. No other  complaints or concerns today.  AT THAT OV---Rxed:  Losartan 50mg  1 po QD Doxy 100mg  BID x 10d Pred 20mg  QD x 5 d  03/09/2016: He reports that he has been taking the losartan 50 mg daily. Having no adverse effects. Says [place] to [facility] [place] [facility] and [facility] him a new blood pressure monitor that is accurate. Reports that he did take the doxycycline and prednisone as directed. Reports that his chest congestion and increased cough have resolved and have returned to his baseline. No longer coughing up thick dark phlegm. AT THAT OV: Increased Losartan to 100mg  QD  03/23/2016: He reports he has been taking the losartan 100 mg daily. Having no adverse effects. States that he did buy a new blood pressure machine and has checked it against the manual blood pressure to check to make sure that is accurate. Brought in documentation of his recent readings. Says that early morning it runs a little bit higher and it was 142/77. Later in the day 128/78 130/70 and 126/76. No complaints or concerns today.   10/16/2016: He reports that he is taking the blood pressure medication daily. Losartan 100 mg. Says that he has not been checking his blood pressure at home. Says that he has been having increased congestion for the past 3 or 4 weeks. [facility] and is also using Mucinex but symptoms not resolving. Coughing up increased amount of phlegm compared to his usual. Also feels congested in his chest and his head and nose. Says that he has pretty much quit smoking completely. Says that his friend died in January and he smoked a few cigarettes when he was upset with that but otherwise has quit. No other complaints or concerns today.  AT THAT OV: Added Norvasc-- will take 5 mg daily for 5 days then increase to taking 2 daily for 10 mg.  Also cont the losartan 100 mg daily.  Also checked BMET  to monitor this. Potassium, came back high at 5.6.  He was to continue the [place]. Add doxycycline 100mg  BID x  10days and prednisone 20mg  1 daily for 5 days.    10/26/2016: Today he reports that he did add the Norvasc as directed at last visit. Has increased to taking 2 daily. Is having no adverse effects with this. He reports that he finished the prednisone around Friday and was feeling good. Says then on Saturday he and his wife went outside working and there was a lot of pollen. However they also were around their granddaughter who had been sick. All of the sudden both patient and his wife got sick. They state that the wife went and saw her doctor earlier this week and was prescribed antibiotics and she  says that she is feeling much better after getting on the antibiotics. Patient states that now he is feeling even worse than he did at last visit. Having runny nose and cough. Has had no known fever. No significant sore throat.  AT THAT OV:  Changed Norvasc to 10mg  daily.  Added Levaquin 750mg  QD x 7 days and Prednisone 40mg  for 4 days then 20mg  for 2 days   10/30/2016: Today patient states that he is taking the Levaquin and the prednisone as directed. Says that there is a lot of pollen at his house. Says that yesterday when he tried to walk to the car his breathing got worse. Thinks that he then got into a panic. Called his friend who works with EMS and they checked him and did give him some nasal cannula oxygen. Wife notes that "he has not gone upstairs in 4 days-- does not have the breath to get up the steps" . Regarding the pollen at his house asked if he had been outside and he states that he has not gone outside the past of days but prior to that had been outside and there was a lot of dust -- and a lot of pollen--- had to go inside and avoid that.   No past medical history on file.   Home Meds: Outpatient Medications Prior to Visit  Medication Sig Dispense Refill  . amLODipine (NORVASC) 10 MG tablet Take 1 tablet (10 mg total) by mouth daily. 30 tablet 3  . BREO ELLIPTA 200-25 MCG/INH AEPB   10  .  levofloxacin (LEVAQUIN) 750 MG tablet Take 1 tablet (750 mg total) by mouth daily. 7 tablet 0  . losartan (COZAAR) 100 MG tablet Take 1 tablet (100 mg total) by mouth daily. 90 tablet 1  . predniSONE (DELTASONE) 20 MG tablet Take 2 daily for 4 days then take 1 daily for 2 days 10 tablet 0  . VENTOLIN HFA 108 (90 Base) MCG/ACT inhaler INHALE 2 PUFFS INTO THE LUNGS EVERY 4 HOURS AS NEEDED. 18 g 3   No facility-administered medications prior to visit.     Allergies:  Allergies  Allergen Reactions  . Penicillins         Social History   Social History  . Marital status: Married    Spouse name: N/A  . Number of children: N/A  . Years of education: N/A   Occupational History  . Not on file.   Social History Main Topics  . Smoking status: Current Some Day Smoker  . Smokeless tobacco: Never Used  . Alcohol use No  . Drug use: No  . Sexual activity: Not on file   Other Topics Concern  . Not on file   Social History Narrative  . No narrative on file    Family History  Problem Relation Age of Onset  . Hypertension Mother   . Arthritis Father   . Stroke Father   . Diabetes Maternal Grandmother      Review of Systems:  See HPI for pertinent ROS. All other ROS negative.    Physical Exam: Blood pressure (!) 190/92, pulse (!) 105, temperature 97.7 F (36.5 C), temperature source Oral, resp. rate 18, weight 188 lb 9.6 oz (85.5 kg), SpO2 93 %., Body mass index is 24.21 kg/m. General:  WNWD WM. Appears in no acute distress. Neck: Supple. No thyromegaly. No lymphadenopathy. No carotid bruit. Lungs: Distant, decreased breath sounds throughout bilaterally. I hear no wheezes on exam--- just distant, decreased breath sounds throughout.  Heart: RRR with S1 S2. No murmurs, rubs, or gallops. Musculoskeletal:  Strength and tone normal for age. Extremities/Skin: Warm and dry. No LE edema.  Neuro: Alert and oriented X 3. Moves all extremities spontaneously. Gait is normal. CNII-XII  grossly in tact. Psych:  Responds to questions appropriately with a normal affect.     ASSESSMENT AND PLAN:  71 y.o. year old male with     Pulmonary emphysema, unspecified emphysema type (HCC)  COPD exacerbation (HCC)  Complete the Levaquin prescribed at office visit 10/26/16.  DuoNeb given here in the office today.  Depo-Medrol 60 mg IM given here in the office.  As well -- am sending him for chest x-ray and will follow up with him when I get that result today.     [address] [place], [place], [facility] 10/30/2016 12:36 PM

## 2016-10-31 DIAGNOSIS — M546 Pain in thoracic spine: Secondary | ICD-10-CM | POA: Diagnosis not present

## 2016-10-31 DIAGNOSIS — M9902 Segmental and somatic dysfunction of thoracic region: Secondary | ICD-10-CM | POA: Diagnosis not present

## 2016-11-01 ENCOUNTER — Telehealth: Payer: Self-pay

## 2016-11-01 NOTE — Telephone Encounter (Signed)
Patient called and states he feels a lot better breathing better, walking more. Patient is asking for another round of antibiotics to clear up his symptoms completely. Patient is asking what he can take for allergies because when he goes out in the air he began to have breathing problems again with all the pollen in the air.  Patient is also asking if an Rx can be sent in for him to get a breathing machine with the supplies. Patient states he is not worried about the cost  Pls advise

## 2016-11-02 MED ORDER — MONTELUKAST SODIUM 10 MG PO TABS: 10.0000 mg | ORAL_TABLET | Freq: Every day | ORAL | 3 refills | Status: DC

## 2016-11-02 MED ORDER — PREDNISONE 20 MG PO TABS: 20.0000 mg | ORAL_TABLET | Freq: Every day | ORAL | 0 refills | Status: DC

## 2016-11-02 MED ORDER — CETIRIZINE HCL 10 MG PO TABS: 10.0000 mg | ORAL_TABLET | Freq: Every day | ORAL | 3 refills | Status: DC

## 2016-11-02 MED ORDER — FLUTICASONE-UMECLIDIN-VILANT 100-62.5-25 MCG/INH IN AEPB: 1.0000 | INHALATION_SPRAY | Freq: Every day | RESPIRATORY_TRACT | 3 refills | Status: DC

## 2016-11-02 NOTE — Telephone Encounter (Signed)
Tell pt that: -- I dont think he needs another round of antibiotics.  --May need another round of prednisone --Also, the prednisone would help with the allergy issues --I dont think a nebulizer is the answer.  --Instead, will change Breo to Trelegy. The Virgel BouquetBreo has 2 medications in it. The Trelegy has combination of 3 different medications-- each of these work in different ways to help treat COPD  Send Rx for: --Trelegy---1 inhalation once daily --Prednisone--20mg  1 po QD x 5 days # 5 + 0 --Singulair 10mg  1 po QD # 30 + 3 --- This will help with allergies and breathing related to allergies. --Zyrtec 10mg  1 po QD # 30 + 3-- This will help with allergies

## 2016-11-02 NOTE — Telephone Encounter (Signed)
Rx sent in patient aware 

## 2016-11-03 ENCOUNTER — Telehealth: Payer: Self-pay | Admitting: Physician Assistant

## 2016-11-03 NOTE — Telephone Encounter (Signed)
Spoke with Eric Spencer from [employer] in regards to the appeal for the non formulary Trelegy Ellipta. She wanted to confirm patient had not tried Anoro  Ellipta, or Spiriva. Patient has not tried theses two she will process the appeal and notify me with a decision.

## 2016-11-03 NOTE — Telephone Encounter (Signed)
Toniann FailWendy from [employer] calling you regarding this patient  Please call her back at [phone]

## 2016-11-03 NOTE — Telephone Encounter (Signed)
Pharmacy faxed  over a prior authorization for Trelegy Ellipta.  The PA was denied will send a letter to try and appeal it

## 2016-11-06 DIAGNOSIS — M9903 Segmental and somatic dysfunction of lumbar region: Secondary | ICD-10-CM | POA: Diagnosis not present

## 2016-11-06 DIAGNOSIS — M5442 Lumbago with sciatica, left side: Secondary | ICD-10-CM | POA: Diagnosis not present

## 2016-11-06 MED ORDER — TIOTROPIUM BROMIDE MONOHYDRATE 2.5 MCG/ACT IN AERS: 2.5000 ug | INHALATION_SPRAY | Freq: Every day | RESPIRATORY_TRACT | 11 refills | Status: DC

## 2016-11-06 MED ORDER — TIOTROPIUM BROMIDE MONOHYDRATE 2.5 MCG/ACT IN AERS: 2.5000 ug | INHALATION_SPRAY | Freq: Every day | RESPIRATORY_TRACT | 4 refills | Status: DC

## 2016-11-06 NOTE — Telephone Encounter (Signed)
Spoke with Toniann FailWendy with [employer] she stated the appeal was denied b/c patient had not tried alternatives and failed. The alternatives are Anoro Ellipta and Spiriva which would you like to switch to?

## 2016-11-06 NOTE — Telephone Encounter (Signed)
He is on Breo.  Remove Trelegy from med list.  Cont Breo. Add Spiriva to this. Spiriva -- one inhalation daily--- # 1 + 11

## 2016-11-06 NOTE — Telephone Encounter (Signed)
Rx sent to pharmacy. Patient aware of inhaler change

## 2016-11-23 DIAGNOSIS — M9903 Segmental and somatic dysfunction of lumbar region: Secondary | ICD-10-CM | POA: Diagnosis not present

## 2016-11-23 DIAGNOSIS — M5442 Lumbago with sciatica, left side: Secondary | ICD-10-CM | POA: Diagnosis not present

## 2016-11-27 ENCOUNTER — Telehealth: Payer: Self-pay

## 2016-11-27 NOTE — Telephone Encounter (Signed)
Pt called and states zyrtec makes him sleepy.I advised pt to either take the zyrtec at night or he can just take the singulair they both work the same

## 2016-11-30 DIAGNOSIS — M5442 Lumbago with sciatica, left side: Secondary | ICD-10-CM | POA: Diagnosis not present

## 2016-11-30 DIAGNOSIS — M9903 Segmental and somatic dysfunction of lumbar region: Secondary | ICD-10-CM | POA: Diagnosis not present

## 2016-12-01 ENCOUNTER — Ambulatory Visit: Payer: Self-pay | Admitting: Family Medicine

## 2016-12-04 ENCOUNTER — Ambulatory Visit (INDEPENDENT_AMBULATORY_CARE_PROVIDER_SITE_OTHER): Payer: Medicare Other | Admitting: Physician Assistant

## 2016-12-04 ENCOUNTER — Encounter: Payer: Self-pay | Admitting: Physician Assistant

## 2016-12-04 VITALS — BP 180/98 | HR 115 | Temp 98.0°F | Resp 20 | Wt 188.4 lb

## 2016-12-04 DIAGNOSIS — J441 Chronic obstructive pulmonary disease with (acute) exacerbation: Secondary | ICD-10-CM

## 2016-12-04 DIAGNOSIS — B370 Candidal stomatitis: Secondary | ICD-10-CM | POA: Diagnosis not present

## 2016-12-04 MED ORDER — PREDNISONE 20 MG PO TABS: ORAL_TABLET | ORAL | 0 refills | Status: DC

## 2016-12-04 MED ORDER — NYSTATIN 100000 UNIT/ML MT SUSP: 5.0000 mL | Freq: Four times a day (QID) | OROMUCOSAL | 0 refills | Status: DC

## 2016-12-04 NOTE — Progress Notes (Signed)
Patient ID: Eric Spencer MRN: [id number], DOB: 05-Sep-1945, 71 y.o. Date of Encounter: @DATE @  Chief Complaint:  Chief Complaint  Patient presents with  . Cough  . Shortness of Breath  . Sore Throat    HPI: 71 y.o. year old white male    01/13/2016: presents as a new patient to establish care.   He states that he was originally from [place]. [place]. At that time moved back to [place] area. Grandchildren live there.   Says that he used to see Dr. Megan Mans in [place] until he retired. Says at times he would see him and would be given antibiotics and inhaler but patient says he was never told the diagnosis "COPD."  Says that this past March he went to the hospital and had positive test for flu. Was told that he had "COPD". Was given inhaler medication to use.  Says that after that he was going to establish with a new primary care provider. However he says that that doctor just said quit smoking and walked out. Gave him no prescription medicines.  He says that over the years it would be times that he would be --- mowing the grass or getting up leaves---- that would cause him to go to the doctor and get medications.  He says that over the past 2 weeks he has been decreasing his smoking and is trying to quit. Monday and Tuesday of this week he only smoked 1 cigarette in the morning and 1 in the evening. He smoked 3 cigarettes today.  Says that he started smoking around college. Has quit twice. ~ 3 years ago quit for about one year. The second time he quit he didn't stay quit for long. Says that most of these years he has smoked on average about 1 pack per day. Says that it is very difficult to quit and is interested in using a medicine to help him quit.  Also discussed his blood pressure is elevated today. Discussed going ahead and starting medication  today. However he would prefer to come back here for 1 more blood pressure check prior to starting meds for this. Says that if it is high on recheck that he is agreeable to take medication but wants to wait and make sure since this is his first visit.  Reports that he has noticed that he has decreased sensation on the bottoms of his toes on both feet.  He has noticed this since about March.  He drinks no alcohol.  No other complaints or concerns today.  AT THAT OV-- Refused to start blood pressure medication at that time. Instead, planned that he would return for follow-up visit in 1-2 weeks to recheck blood pressure. He came on 01/27/16 and had nurse blood pressure check. She got blood pressure readings elevated at 160/78 and 168/84. Then scheduled to see Dr. Tanya Nones for the following day. He had visit with Dr. Tanya Nones on 01/28/16.  At that time he started losartan 100 mg daily.  02/21/2016: Today patient reports that he did not start the losartan. Says that the pharmacist made a comment that that was a really high dose to start with. His friends also made the same remark--so then he was scared to take it. Afraid that it was "too much ". Says that he would be agreeable to take the 50 mg tablet but was  scared to take the 100 mg. As well he bought a blood pressure machine and has been checking his blood pressure and writing down his readings and he brings that in today. All of his readings he has documented are 130s over 70s to 140s over 80s. Such as 132/75 140/78 136/74. He also says that he is having coughing and symptoms just like he had in the past. Says it's just like before-- he mowed the grass on Thursday and then "it hit me Friday "coughing up thick dark green phlegm. Has some drainage from his nose. Throat feels scratchy. Not getting much sleep secondary to cough. Has used his albuterol inhaler multiple times yesterday. Feels like he has a lot of congestion in his chest and he can't get out. No  fevers or chills. No other complaints or concerns today.  AT THAT OV---Rxed:  Losartan 50mg  1 po QD Doxy 100mg  BID x 10d Pred 20mg  QD x 5 d  03/09/2016: He reports that he has been taking the losartan 50 mg daily. Having no adverse effects. Says he plans to go to [facility] and buy him a new blood pressure monitor that is accurate. Reports that he did take the doxycycline and prednisone as directed. Reports that his chest congestion and increased cough have resolved and have returned to his baseline. No longer coughing up thick dark phlegm. AT THAT OV: Increased Losartan to 100mg  QD  03/23/2016: He reports he has been taking the losartan 100 mg daily. Having no adverse effects. States that he did buy a new blood pressure machine and has checked it against the manual blood pressure to check to make sure that is accurate. Brought in documentation of his recent readings. Says that early morning it runs a little bit higher and it was 142/77. Later in the day 128/78 130/70 and 126/76. No complaints or concerns today.   10/16/2016: He reports that he is taking the blood pressure medication daily. Losartan 100 mg. Says that he has not been checking his blood pressure at home. Says that he has been having increased congestion for the past 3 or 4 weeks. [facility] and is also using Mucinex but symptoms not resolving. Coughing up increased amount of phlegm compared to his usual. Also feels congested in his chest and his head and nose. Says that he has pretty much quit smoking completely. Says that his friend died in January and he smoked a few cigarettes when he was upset with that but otherwise has quit. No other complaints or concerns today.  AT THAT OV: Added Norvasc-- will take 5 mg daily for 5 days then increase to taking 2 daily for 10 mg.  Also cont the losartan 100 mg daily.  Also checked BMET  to monitor this. Potassium, came back high at 5.6.  He was to [facility] the [facility].  Add doxycycline 100mg  BID x 10days and prednisone 20mg  1 daily for 5 days.    10/26/2016: Today he reports that he did add the Norvasc as directed at last visit. Has increased to taking 2 daily. Is having no adverse effects with this. He reports that he finished the prednisone around Friday and was feeling good. Says then on Saturday he and his wife went outside working and there was a lot of pollen. However they also were around their granddaughter who had been sick. All of the sudden both patient and his wife got sick. They state that the wife went and saw her doctor earlier this  week and was prescribed antibiotics and she says that she is feeling much better after getting on the antibiotics. Patient states that now he is feeling even worse than he did at last visit. Having runny nose and cough. Has had no known fever. No significant sore throat.  AT THAT OV:  Changed Norvasc to 10mg  daily.  Added Levaquin 750mg  QD x 7 days and Prednisone 40mg  for 4 days then 20mg  for 2 days   10/30/2016: Today patient states that he is taking the Levaquin and the prednisone as directed. Says that there is a lot of pollen at his house. Says that yesterday when he tried to walk to the car his breathing got worse. Thinks that he then got into a panic. Called his friend who works with EMS and they checked him and did give him some nasal cannula oxygen. Wife notes that "he has not gone upstairs in 4 days-- does not have the breath to get up the steps" . Regarding the pollen at his house asked if he had been outside and he states that he has not gone outside the past of days but prior to that had been outside and there was a lot of dust -- and a lot of pollen--- had to go inside and avoid that.  AT THAT OV: Had him complete the Levaquin Rxed at OV 10/26/16 Gave DuoNeb in the office. Gave Depo-Medrol 60 mg IM in the office. Sent him for chest x-ray. Chest x-ray was performed 10/30/16 and showed findings consistent with COPD but  no acute disease.   12/04/2016: Patient states that the treatment given at last office visit "worked great ". Says "I was doing stuff I hadn't done in 6 months ". Says that he was able to plow the garden, planted a garden, and mowed the grass-- wore a mask when mowed the grass. Was able to sing for church again.  Says that he felt great for about 1 week but then after that --breathing gradually worsened again. Says that he is using the Breo, the Singulair and Spiriva. Says that he has noticed some increased phlegm just over the last couple of days but otherwise is just feeling increased shortness of breath and increased cough. Upper throat also feels irritated. Says that he knows that blood pressure is reading high here but says that he just checked it at home prior to coming here and was reading 145/80 and says that he knows that it is reading high because of white coat hypertension. Also discusses that he had asthma as a child but then went years with no problems but then recent years has been having problems with breathing again.  No past medical history on file.   Home Meds: Outpatient Medications Prior to Visit  Medication Sig Dispense Refill  . amLODipine (NORVASC) 10 MG tablet Take 1 tablet (10 mg total) by mouth daily. 30 tablet 3  . BREO ELLIPTA 200-25 MCG/INH AEPB   10  . cetirizine (ZYRTEC) 10 MG tablet Take 1 tablet (10 mg total) by mouth daily. 30 tablet 3  . losartan (COZAAR) 100 MG tablet Take 1 tablet (100 mg total) by mouth daily. 90 tablet 1  . montelukast (SINGULAIR) 10 MG tablet Take 1 tablet (10 mg total) by mouth daily. 30 tablet 3  . Tiotropium Bromide Monohydrate (SPIRIVA RESPIMAT) 2.5 MCG/ACT AERS Inhale 2.5 mcg into the lungs daily. 1 Inhaler 11  . VENTOLIN HFA 108 (90 Base) MCG/ACT inhaler INHALE 2 PUFFS INTO THE LUNGS EVERY  4 HOURS AS NEEDED. 18 g 3  . levofloxacin (LEVAQUIN) 750 MG tablet Take 1 tablet (750 mg total) by mouth daily. 7 tablet 0  . predniSONE  (DELTASONE) 20 MG tablet Take 1 tablet (20 mg total) by mouth daily with breakfast. 5 tablet 0   No facility-administered medications prior to visit.     Allergies:  Allergies  Allergen Reactions  . Penicillins         Social History   Social History  . Marital status: Married    Spouse name: N/A  . Number of children: N/A  . Years of education: N/A   Occupational History  . Not on file.   Social History Main Topics  . Smoking status: Former Smoker    Quit date: 10/27/2016  . Smokeless tobacco: Never Used  . Alcohol use No  . Drug use: No  . Sexual activity: Not on file   Other Topics Concern  . Not on file   Social History Narrative  . No narrative on file    Family History  Problem Relation Age of Onset  . Hypertension Mother   . Arthritis Father   . Stroke Father   . Diabetes Maternal Grandmother      Review of Systems:  See HPI for pertinent ROS. All other ROS negative.    Physical Exam: Blood pressure (!) 180/98, pulse (!) 115, temperature 98 F (36.7 C), temperature source Oral, resp. rate 20, weight 188 lb 6.4 oz (85.5 kg), SpO2 96 %., Body mass index is 24.19 kg/m. General:  WNWD WM. Appears in no acute distress. HEENT: Posterior Pharynx with dots of erythema--c/w yeast "satellite lesions" Neck: Supple. No thyromegaly. No lymphadenopathy. No carotid bruit. Lungs: Distant, decreased breath sounds throughout bilaterally. I hear no wheezes on exam--- just distant, decreased breath sounds throughout. Heart: RRR with S1 S2. No murmurs, rubs, or gallops. Musculoskeletal:  Strength and tone normal for age. Extremities/Skin: Warm and dry. No LE edema.  Neuro: Alert and oriented X 3. Moves all extremities spontaneously. Gait is normal. CNII-XII grossly in tact. Psych:  Responds to questions appropriately with a normal affect.     ASSESSMENT AND PLAN:  71 y.o. year old male with     1. COPD exacerbation (HCC) I told him that at this time -- I am  holding off on antibiotic. Am concerned of him developing antibiotic resistance. However, if he feels that he needs to add antibiotic---if amount of phlegm increases or develops fever -- then call me and will add Levaquin. At this time he is to add prednisone at dosing below. Continue the Breo, Singulair and Spiriva  ---If he continues with recurrent symptoms/exacerbations then will consider him going to asthma and allergy Center or pulmonologist to see if there may be an allergy component to this that warrants additional treatment/medication. - predniSONE (DELTASONE) 20 MG tablet; Take 2 daily for 2 days then take 1 daily for 4 days  Dispense: 8 tablet; Refill: 0  2. Thrush Told him to swish and swallow after meals and at bedtime. Also he is to rinse his mouth after using the Breo - nystatin (MYCOSTATIN) 100000 UNIT/ML suspension; Take 5 mLs (500,000 Units total) by mouth 4 (four) times daily.  Dispense: 60 mL; Refill: 0      Signed, [address] [place], [place], [facility] 12/04/2016 11:05 AM

## 2016-12-18 DIAGNOSIS — M5442 Lumbago with sciatica, left side: Secondary | ICD-10-CM | POA: Diagnosis not present

## 2016-12-18 DIAGNOSIS — M9903 Segmental and somatic dysfunction of lumbar region: Secondary | ICD-10-CM | POA: Diagnosis not present

## 2016-12-28 ENCOUNTER — Telehealth: Payer: Self-pay

## 2016-12-28 NOTE — Telephone Encounter (Signed)
Patient called and informed me that the pharmacy gave him trelegy in the place of Breo and that he stopped using spirivia.

## 2016-12-29 ENCOUNTER — Other Ambulatory Visit: Payer: Self-pay

## 2016-12-29 MED ORDER — FLUTICASONE-UMECLIDIN-VILANT 100-62.5-25 MCG/INH IN AEPB: 1.0000 | INHALATION_SPRAY | Freq: Every day | RESPIRATORY_TRACT | 0 refills | Status: DC

## 2016-12-29 NOTE — Telephone Encounter (Signed)
Patient states insurance will now cover trelegy now with a little higher co-pay. I have removed Breo as well as Spirivia off of pt med list and sent in refill for Trelegy

## 2017-01-03 DIAGNOSIS — M9903 Segmental and somatic dysfunction of lumbar region: Secondary | ICD-10-CM | POA: Diagnosis not present

## 2017-01-03 DIAGNOSIS — M5442 Lumbago with sciatica, left side: Secondary | ICD-10-CM | POA: Diagnosis not present

## 2017-01-09 DIAGNOSIS — M5442 Lumbago with sciatica, left side: Secondary | ICD-10-CM | POA: Diagnosis not present

## 2017-01-09 DIAGNOSIS — M9903 Segmental and somatic dysfunction of lumbar region: Secondary | ICD-10-CM | POA: Diagnosis not present

## 2017-01-15 DIAGNOSIS — M5442 Lumbago with sciatica, left side: Secondary | ICD-10-CM | POA: Diagnosis not present

## 2017-01-15 DIAGNOSIS — M9903 Segmental and somatic dysfunction of lumbar region: Secondary | ICD-10-CM | POA: Diagnosis not present

## 2017-01-25 ENCOUNTER — Ambulatory Visit: Payer: Medicare Other | Admitting: Physician Assistant

## 2017-02-12 DIAGNOSIS — M9903 Segmental and somatic dysfunction of lumbar region: Secondary | ICD-10-CM | POA: Diagnosis not present

## 2017-02-12 DIAGNOSIS — M545 Low back pain: Secondary | ICD-10-CM | POA: Diagnosis not present

## 2017-02-15 DIAGNOSIS — M9903 Segmental and somatic dysfunction of lumbar region: Secondary | ICD-10-CM | POA: Diagnosis not present

## 2017-02-15 DIAGNOSIS — M545 Low back pain: Secondary | ICD-10-CM | POA: Diagnosis not present

## 2017-02-19 ENCOUNTER — Ambulatory Visit (INDEPENDENT_AMBULATORY_CARE_PROVIDER_SITE_OTHER): Payer: Medicare Other | Admitting: Physician Assistant

## 2017-02-19 VITALS — BP 172/80 | HR 89 | Temp 98.0°F | Resp 16 | Ht 74.0 in | Wt 198.8 lb

## 2017-02-19 DIAGNOSIS — I10 Essential (primary) hypertension: Secondary | ICD-10-CM

## 2017-02-19 DIAGNOSIS — J441 Chronic obstructive pulmonary disease with (acute) exacerbation: Secondary | ICD-10-CM | POA: Diagnosis not present

## 2017-02-19 DIAGNOSIS — J439 Emphysema, unspecified: Secondary | ICD-10-CM | POA: Diagnosis not present

## 2017-02-19 DIAGNOSIS — M545 Low back pain: Secondary | ICD-10-CM | POA: Diagnosis not present

## 2017-02-19 DIAGNOSIS — M9903 Segmental and somatic dysfunction of lumbar region: Secondary | ICD-10-CM | POA: Diagnosis not present

## 2017-02-19 MED ORDER — PREDNISONE 20 MG PO TABS: ORAL_TABLET | ORAL | 0 refills | Status: DC

## 2017-02-19 NOTE — Progress Notes (Signed)
Patient ID: Eric Spencer MRN: [id number], DOB: 1946/01/10, 71 y.o. Date of Encounter: @DATE @  Chief Complaint:  Chief Complaint  Patient presents with  . routine follow up    HPI: 71 y.o. year old white male    01/13/2016: presents as a new patient to establish care.   He states that he was originally from [place]. [place] about [place]. At that time moved back to [place] area. Grandchildren live there.   Says that he used to see Dr. Megan Mans in [place] until he retired. Says at times he would see him and would be given antibiotics and inhaler but patient says he was never told the diagnosis "COPD."  Says that this past March he went to the hospital and had positive test for flu. Was told that he had "COPD". Was given inhaler medication to use.  Says that after that he was going to establish with a new primary care provider. However he says that that doctor just said quit smoking and walked out. Gave him no prescription medicines.  He says that over the years it would be times that he would be --- mowing the grass or getting up leaves---- that would cause him to go to the doctor and get medications.  He says that over the past 2 weeks he has been decreasing his smoking and is trying to quit. Monday and Tuesday of this week he only smoked 1 cigarette in the morning and 1 in the evening. He smoked 3 cigarettes today.  Says that he started smoking around college. Has quit twice. ~ 3 years ago quit for about one year. The second time he quit he didn't stay quit for long. Says that most of these years he has smoked on average about 1 pack per day. Says that it is very difficult to quit and is interested in using a medicine to help him quit.  Also discussed his blood pressure is elevated today. Discussed going ahead and starting medication today. However he would  prefer to come back here for 1 more blood pressure check prior to starting meds for this. Says that if it is high on recheck that he is agreeable to take medication but wants to wait and make sure since this is his first visit.  Reports that he has noticed that he has decreased sensation on the bottoms of his toes on both feet.  He has noticed this since about March.  He drinks no alcohol.  No other complaints or concerns today.  AT THAT OV-- Refused to start blood pressure medication at that time. Instead, planned that he would return for follow-up visit in 1-2 weeks to recheck blood pressure. He came on 01/27/16 and had nurse blood pressure check. She got blood pressure readings elevated at 160/78 and 168/84. Then scheduled to see Dr. Tanya Nones for the following day. He had visit with Dr. Tanya Nones on 01/28/16.  At that time he started losartan 100 mg daily.  02/21/2016: Today patient reports that he did not start the losartan. Says that the pharmacist made a comment that that was a really high dose to start with. His friends also made the same remark--so then he was scared to take it. Afraid that it was "too much ". Says that he would be agreeable to take the 50 mg tablet but was scared to take the 100 mg. As  well he bought a blood pressure machine and has been checking his blood pressure and writing down his readings and he brings that in today. All of his readings he has documented are 130s over 70s to 140s over 80s. Such as 132/75 140/78 136/74. He also says that he is having coughing and symptoms just like he had in the past. Says it's just like before-- he mowed the grass on Thursday and then "it hit me Friday "coughing up thick dark green phlegm. Has some drainage from his nose. Throat feels scratchy. Not getting much sleep secondary to cough. Has used his albuterol inhaler multiple times yesterday. Feels like he has a lot of congestion in his chest and he can't get out. No fevers or chills. No other  complaints or concerns today.  AT THAT OV---Rxed:  Losartan 50mg  1 po QD Doxy 100mg  BID x 10d Pred 20mg  QD x 5 d  03/09/2016: He reports that he has been taking the losartan 50 mg daily. Having no adverse effects. Says he plans to go to [facility] and buy him a new blood pressure monitor that is accurate. Reports that he did take the doxycycline and prednisone as directed. Reports that his chest congestion and increased cough have resolved and have returned to his baseline. No longer coughing up thick dark phlegm. AT THAT OV: Increased Losartan to 100mg  QD  03/23/2016: He reports he has been taking the losartan 100 mg daily. Having no adverse effects. States that he did buy a new blood pressure machine and has checked it against the manual blood pressure to check to make sure that is accurate. Brought in documentation of his recent readings. Says that early morning it runs a little bit higher and it was 142/77. Later in the day 128/78 130/70 and 126/76. No complaints or concerns today.   10/16/2016: He reports that he is taking the blood pressure medication daily. Losartan 100 mg. Says that he has not been checking his blood pressure at home. Says that he has been having increased congestion for the past 3 or 4 weeks. [facility] and is also using Mucinex but symptoms not resolving. Coughing up increased amount of phlegm compared to his usual. Also feels congested in his chest and his head and nose. Says that he has pretty much quit smoking completely. Says that his friend died in January and he smoked a few cigarettes when he was upset with that but otherwise has quit. No other complaints or concerns today.  AT THAT OV: Added Norvasc-- will take 5 mg daily for 5 days then increase to taking 2 daily for 10 mg.  Also cont the losartan 100 mg daily.  Also checked BMET  to monitor this. Potassium, came back high at 5.6.  He was to continue the [facility]. Add doxycycline 100mg  BID x  10days and prednisone 20mg  1 daily for 5 days.    10/26/2016: Today he reports that he did add the Norvasc as directed at last visit. Has increased to taking 2 daily. Is having no adverse effects with this. He reports that he finished the prednisone around Friday and was feeling good. Says then on Saturday he and his wife went outside working and there was a lot of pollen. However they also were around their granddaughter who had been sick. All of the sudden both patient and his wife got sick. They state that the wife went and saw her doctor earlier this week and was prescribed antibiotics and she  says that she is feeling much better after getting on the antibiotics. Patient states that now he is feeling even worse than he did at last visit. Having runny nose and cough. Has had no known fever. No significant sore throat.   02/19/2017: Today he states that he was doing great until 2 weeks ago. Says that prior to 2 weeks ago he was feeling the best that he ever has in years. Says that he "was able to go to [employer] and walk around back and forth" to his truck without any shortness of breath. Says he was able to go to the beach and not have to use his inhaler. Says at church he was able to walk around the church and do all of the lights and not have to stop with any shortness of breath. However says that 2 weeks ago he mowed grass without wearing a mask. Says that he was trying to hurry before it was going to rain and ended up mowing his yard and the yard next door but have rushed without using the mask. Says the next morning he noticed significant shortness of breath and has continued with wheezing/shortness of breath. Says that he is sometimes coughing up "gob" of phlegm.  Says that he has not smoked since April 2018.  Says that they went to the beach with his grandchildren. Says 2 of them are age 20. There is a 68-year-old and a 62-year-old. Says that his granddaughter that just started middle school today  also started her period today. Says that at the beach they wore them out !!  He also reports that he had been taking one blood pressure pill medicine in the morning and taking the other one at night. Says that he must admit that many nights he ends up falling asleep without taking that second blood pressure medicine. Today I have told him it is fine to go ahead and take them together in the morning.  He has no other concerns to address today.  No past medical history on file.   Home Meds: Outpatient Medications Prior to Visit  Medication Sig Dispense Refill  . amLODipine (NORVASC) 10 MG tablet Take 1 tablet (10 mg total) by mouth daily. 30 tablet 3  . cetirizine (ZYRTEC) 10 MG tablet Take 1 tablet (10 mg total) by mouth daily. 30 tablet 3  . Fluticasone-Umeclidin-Vilant (TRELEGY ELLIPTA) 100-62.5-25 MCG/INH AEPB Inhale 1 puff into the lungs daily. 1 each 0  . losartan (COZAAR) 100 MG tablet Take 1 tablet (100 mg total) by mouth daily. 90 tablet 1  . montelukast (SINGULAIR) 10 MG tablet Take 1 tablet (10 mg total) by mouth daily. 30 tablet 3  . predniSONE (DELTASONE) 20 MG tablet Take 2 daily for 2 days then take 1 daily for 4 days 8 tablet 0  . VENTOLIN HFA 108 (90 Base) MCG/ACT inhaler INHALE 2 PUFFS INTO THE LUNGS EVERY 4 HOURS AS NEEDED. 18 g 3  . nystatin (MYCOSTATIN) 100000 UNIT/ML suspension Take 5 mLs (500,000 Units total) by mouth 4 (four) times daily. 60 mL 0   No facility-administered medications prior to visit.     Allergies:  Allergies  Allergen Reactions  . Penicillins         Social History   Social History  . Marital status: Married    Spouse name: N/A  . Number of children: N/A  . Years of education: N/A   Occupational History  . Not on file.   Social History Main Topics  .  Smoking status: Former Smoker    Quit date: 10/27/2016  . Smokeless tobacco: Never Used  . Alcohol use No  . Drug use: No  . Sexual activity: Not on file   Other Topics Concern  .  Not on file   Social History Narrative  . No narrative on file    Family History  Problem Relation Age of Onset  . Hypertension Mother   . Arthritis Father   . Stroke Father   . Diabetes Maternal Grandmother      Review of Systems:  See HPI for pertinent ROS. All other ROS negative.    Physical Exam: Blood pressure (!) 172/80, pulse 89, temperature 98 F (36.7 C), temperature source Oral, resp. rate 16, height 6\' 2"  (1.88 m), weight 198 lb 12.8 oz (90.2 kg), SpO2 97 %., Body mass index is 25.52 kg/m. General:  WNWD WM. Appears in no acute distress. Neck: Supple. No thyromegaly. No lymphadenopathy. No carotid bruit. Lungs: Distant, decreased breath sounds throughout bilaterally. Mild wheezes throughout bilaterally. Heart: RRR with S1 S2. No murmurs, rubs, or gallops. Musculoskeletal:  Strength and tone normal for age. Extremities/Skin: Warm and dry. No LE edema.  Neuro: Alert and oriented X 3. Moves all extremities spontaneously. Gait is normal. CNII-XII grossly in tact. Psych:  Responds to questions appropriately with a normal affect.     ASSESSMENT AND PLAN:  71 y.o. year old male with   1. Essential hypertension 02/19/2017--- he frequently falls asleep without taking evening blood pressure medication. He is to start taking both blood pressure medications in the morning.   2. Pulmonary emphysema, unspecified emphysema type (HCC) Congratulations on smoking cessation----says that he has not smoked since April 2018 Continue the Zyrtec, Singulair, Trelogy, Albuterol  3. COPD exacerbation (HCC) At this time will use prednisone in addition to his current COPD meds. Follow-up if breathing worsens or does not return to normal baseline upon completion of these. I will not add antibiotic at this time. Do not want him to develop antibiotic resistance. Also exacerbation occurred after mowing grass so seems to be an allergic cause rather than infectious---hopefully does not develop a  secondary infection.  - predniSONE (DELTASONE) 20 MG tablet; Take 3 daily for 2 days, then 2 daily for 2 days, then 1 daily for 2 days.  Dispense: 12 tablet; Refill: 0   Today did discuss colonoscopy. He states he had a sigmoidoscopy many years ago. He refuses to have a colonoscopy. Low guard and he is agreeable. This information was given to him today.  Reviewed that he needs a hepatitis C screen. Will check this when his next set of labs is due in 6 months.  Also asked if he thinks his prior PCP had given him a pneumonia vaccine and he does not think so. Discussed giving pneumonia vaccines but will wait until his current COPD exacerbation is over.      [address] [place], [place], [facility] 02/19/2017 3:47 PM

## 2017-02-27 ENCOUNTER — Other Ambulatory Visit: Payer: Self-pay | Admitting: Physician Assistant

## 2017-03-01 DIAGNOSIS — M9903 Segmental and somatic dysfunction of lumbar region: Secondary | ICD-10-CM | POA: Diagnosis not present

## 2017-03-01 DIAGNOSIS — M545 Low back pain: Secondary | ICD-10-CM | POA: Diagnosis not present

## 2017-03-22 ENCOUNTER — Telehealth: Payer: Self-pay
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
## 2017-03-26 DIAGNOSIS — M545 Low back pain: Secondary | ICD-10-CM | POA: Diagnosis not present

## 2017-03-26 DIAGNOSIS — M9903 Segmental and somatic dysfunction of lumbar region: Secondary | ICD-10-CM | POA: Diagnosis not present

## 2017-04-10 DIAGNOSIS — M545 Low back pain: Secondary | ICD-10-CM | POA: Diagnosis not present

## 2017-04-10 DIAGNOSIS — M9903 Segmental and somatic dysfunction of lumbar region: Secondary | ICD-10-CM | POA: Diagnosis not present

## 2017-04-18 DIAGNOSIS — M545 Low back pain: Secondary | ICD-10-CM | POA: Diagnosis not present

## 2017-04-18 DIAGNOSIS — M9903 Segmental and somatic dysfunction of lumbar region: Secondary | ICD-10-CM | POA: Diagnosis not present

## 2017-04-21 ENCOUNTER — Other Ambulatory Visit: Payer: Self-pay | Admitting: Physician Assistant

## 2017-04-21 DIAGNOSIS — I10 Essential (primary) hypertension: Secondary | ICD-10-CM

## 2017-04-23 NOTE — Telephone Encounter (Signed)
Refill appropriate 

## 2017-04-27 ENCOUNTER — Ambulatory Visit: Payer: Medicare Other

## 2017-04-27 ENCOUNTER — Telehealth: Payer: Self-pay

## 2017-04-27 NOTE — Telephone Encounter (Signed)
Patient cologuard has been suspended for inactivity

## 2017-05-03 ENCOUNTER — Other Ambulatory Visit: Payer: Self-pay | Admitting: Physician Assistant

## 2017-05-03 DIAGNOSIS — I10 Essential (primary) hypertension: Secondary | ICD-10-CM

## 2017-05-08 ENCOUNTER — Ambulatory Visit (INDEPENDENT_AMBULATORY_CARE_PROVIDER_SITE_OTHER): Payer: Medicare Other

## 2017-05-08 DIAGNOSIS — Z23 Encounter for immunization: Secondary | ICD-10-CM | POA: Diagnosis not present

## 2017-05-08 NOTE — Progress Notes (Signed)
Patient was seen in office to receive PCV13 vaccine. Patient received vaccine in left deltoid. patient tolerated well

## 2017-05-09 DIAGNOSIS — M545 Low back pain: Secondary | ICD-10-CM | POA: Diagnosis not present

## 2017-05-09 DIAGNOSIS — M9903 Segmental and somatic dysfunction of lumbar region: Secondary | ICD-10-CM | POA: Diagnosis not present

## 2017-05-22 DIAGNOSIS — M9903 Segmental and somatic dysfunction of lumbar region: Secondary | ICD-10-CM | POA: Diagnosis not present

## 2017-05-22 DIAGNOSIS — M545 Low back pain: Secondary | ICD-10-CM | POA: Diagnosis not present

## 2017-05-23 ENCOUNTER — Other Ambulatory Visit: Payer: Self-pay

## 2017-05-23 ENCOUNTER — Telehealth: Payer: Self-pay

## 2017-05-23 ENCOUNTER — Encounter: Payer: Self-pay | Admitting: Physician Assistant

## 2017-05-23 ENCOUNTER — Ambulatory Visit (INDEPENDENT_AMBULATORY_CARE_PROVIDER_SITE_OTHER): Payer: Medicare Other | Admitting: Physician Assistant

## 2017-05-23 VITALS — BP 164/82 | HR 81 | Temp 97.6°F | Resp 16 | Ht 74.0 in | Wt 208.8 lb

## 2017-05-23 DIAGNOSIS — J439 Emphysema, unspecified: Secondary | ICD-10-CM

## 2017-05-23 DIAGNOSIS — J988 Other specified respiratory disorders: Secondary | ICD-10-CM | POA: Diagnosis not present

## 2017-05-23 DIAGNOSIS — I10 Essential (primary) hypertension: Secondary | ICD-10-CM | POA: Diagnosis not present

## 2017-05-23 DIAGNOSIS — B9689 Other specified bacterial agents as the cause of diseases classified elsewhere: Secondary | ICD-10-CM

## 2017-05-23 DIAGNOSIS — Z1159 Encounter for screening for other viral diseases: Secondary | ICD-10-CM | POA: Diagnosis not present

## 2017-05-23 MED ORDER — AMLODIPINE BESYLATE 10 MG PO TABS: 10.0000 mg | ORAL_TABLET | Freq: Every day | ORAL | 2 refills | Status: DC

## 2017-05-23 MED ORDER — FLUTICASONE-UMECLIDIN-VILANT 100-62.5-25 MCG/INH IN AEPB: 1.0000 | INHALATION_SPRAY | Freq: Every day | RESPIRATORY_TRACT | 5 refills | Status: DC

## 2017-05-23 MED ORDER — PREDNISONE 20 MG PO TABS: ORAL_TABLET | ORAL | 0 refills | Status: DC

## 2017-05-23 MED ORDER — CLONIDINE HCL 0.1 MG PO TABS: 0.1000 mg | ORAL_TABLET | Freq: Two times a day (BID) | ORAL | 11 refills | Status: DC

## 2017-05-23 MED ORDER — AZITHROMYCIN 250 MG PO TABS: ORAL_TABLET | ORAL | 0 refills | Status: DC

## 2017-05-23 MED ORDER — VENTOLIN HFA 108 (90 BASE) MCG/ACT IN AERS: INHALATION_SPRAY | RESPIRATORY_TRACT | 3 refills | Status: DC

## 2017-05-23 MED ORDER — NEBIVOLOL HCL 5 MG PO TABS: 5.0000 mg | ORAL_TABLET | Freq: Every day | ORAL | 11 refills | Status: DC

## 2017-05-23 NOTE — Progress Notes (Addendum)
Patient ID: Eric Spencer MRN: [id number], DOB: 12-22-1945, 71 y.o. Date of Encounter: @DATE @  Chief Complaint:  Chief Complaint  Patient presents with  . 3 month follow up  . head congestion    HPI: 71 y.o. year old white male    01/13/2016: presents as a new patient to establish care.   He states that he was originally from [place]. [place]. [place] beach until about 10 years ago. At that time moved back to [place] area. Grandchildren live there.   Says that he used to see Dr. Megan Mans in [place] until he retired. Says at times he would see him and would be given antibiotics and inhaler but patient says he was never told the diagnosis "COPD."  Says that this past March he went to the hospital and had positive test for flu. Was told that he had "COPD". Was given inhaler medication to use.  Says that after that he was going to establish with a new primary care provider. However he says that that doctor just said quit smoking and walked out. Gave him no prescription medicines.  He says that over the years it would be times that he would be --- mowing the grass or getting up leaves---- that would cause him to go to the doctor and get medications.  He says that over the past 2 weeks he has been decreasing his smoking and is trying to quit. Monday and Tuesday of this week he only smoked 1 cigarette in the morning and 1 in the evening. He smoked 3 cigarettes today.  Says that he started smoking around college. Has quit twice. ~ 3 years ago quit for about one year. The second time he quit he didn't stay quit for long. Says that most of these years he has smoked on average about 1 pack per day. Says that it is very difficult to quit and is interested in using a medicine to help him quit.  Also discussed his blood pressure is elevated today. Discussed going ahead and starting medication today.  However he would prefer to come back here for 1 more blood pressure check prior to starting meds for this. Says that if it is high on recheck that he is agreeable to take medication but wants to wait and make sure since this is his first visit.  Reports that he has noticed that he has decreased sensation on the bottoms of his toes on both feet.  He has noticed this since about March.  He drinks no alcohol.  No other complaints or concerns today.  AT THAT OV-- Refused to start blood pressure medication at that time. Instead, planned that he would return for follow-up visit in 1-2 weeks to recheck blood pressure. He came on 01/27/16 and had nurse blood pressure check. She got blood pressure readings elevated at 160/78 and 168/84. Then scheduled to see Dr. Tanya Nones for the following day. He had visit with Dr. Tanya Nones on 01/28/16.  At that time he started losartan 100 mg daily.  02/21/2016: Today patient reports that he did not start the losartan. Says that the pharmacist made a comment that that was a really high dose to start with. His friends also made the same remark--so then he was scared to take it. Afraid that it was "too much ". Says that he would be agreeable to take the 50 mg tablet but was scared to  take the 100 mg. As well he bought a blood pressure machine and has been checking his blood pressure and writing down his readings and he brings that in today. All of his readings he has documented are 130s over 70s to 140s over 80s. Such as 132/75 140/78 136/74. He also says that he is having coughing and symptoms just like he had in the past. Says it's just like before-- he mowed the grass on Thursday and then "it hit me Friday "coughing up thick dark green phlegm. Has some drainage from his nose. Throat feels scratchy. Not getting much sleep secondary to cough. Has used his albuterol inhaler multiple times yesterday. Feels like he has a lot of congestion in his chest and he can't get out. No fevers or  chills. No other complaints or concerns today.  AT THAT OV---Rxed:  Losartan 50mg  1 po QD Doxy 100mg  BID x 10d Pred 20mg  QD x 5 d  03/09/2016: He reports that he has been taking the losartan 50 mg daily. Having no adverse effects. Says he plans to go to [facility] and buy him a new blood pressure monitor that is accurate. Reports that he did take the doxycycline and prednisone as directed. Reports that his chest congestion and increased cough have resolved and have returned to his baseline. No longer coughing up thick dark phlegm. AT THAT OV: Increased Losartan to 100mg  QD  03/23/2016: He reports he has been taking the losartan 100 mg daily. Having no adverse effects. States that he did buy a new blood pressure machine and has checked it against the manual blood pressure to check to make sure that is accurate. Brought in documentation of his recent readings. Says that early morning it runs a little bit higher and it was 142/77. Later in the day 128/78 130/70 and 126/76. No complaints or concerns today.   10/16/2016: He reports that he is taking the blood pressure medication daily. Losartan 100 mg. Says that he has not been checking his blood pressure at home. Says that he has been having increased congestion for the past 3 or 4 weeks. [facility] and is also using Mucinex but symptoms not resolving. Coughing up increased amount of phlegm compared to his usual. Also feels congested in his chest and his head and nose. Says that he has pretty much quit smoking completely. Says that his friend died in January and he smoked a few cigarettes when he was upset with that but otherwise has quit. No other complaints or concerns today.  AT THAT OV: Added Norvasc-- will take 5 mg daily for 5 days then increase to taking 2 daily for 10 mg.  Also cont the losartan 100 mg daily.  Also checked BMET  to monitor this. Potassium, came back high at 5.6.  He was to continue the [facility]. Add  doxycycline 100mg  BID x 10days and prednisone 20mg  1 daily for 5 days.    10/26/2016: Today he reports that he did add the Norvasc as directed at last visit. Has increased to taking 2 daily. Is having no adverse effects with this. He reports that he finished the prednisone around Friday and was feeling good. Says then on Saturday he and his wife went outside working and there was a lot of pollen. However they also were around their granddaughter who had been sick. All of the sudden both patient and his wife got sick. They state that the wife went and saw her doctor earlier this week and  was prescribed antibiotics and she says that she is feeling much better after getting on the antibiotics. Patient states that now he is feeling even worse than he did at last visit. Having runny nose and cough. Has had no known fever. No significant sore throat.   02/19/2017: Today he states that he was doing great until 2 weeks ago. Says that prior to 2 weeks ago he was feeling the best that he ever has in years. Says that he "was able to go to [employer] and walk around back and forth" to his truck without any shortness of breath. Says he was able to go to the beach and not have to use his inhaler. Says at church he was able to walk around the church and do all of the lights and not have to stop with any shortness of breath. However says that 2 weeks ago he mowed grass without wearing a mask. Says that he was trying to hurry before it was going to rain and ended up mowing his yard and the yard next door but have rushed without using the mask. Says the next morning he noticed significant shortness of breath and has continued with wheezing/shortness of breath. Says that he is sometimes coughing up "gob" of phlegm.  Says that he has not smoked since April 2018.  Says that they went to the beach with his grandchildren. Says 2 of them are age 80. There is a 51-year-old and a 54-year-old. Says that his granddaughter that just  started middle school today also started her period today. Says that at the beach they wore them out !!  He also reports that he had been taking one blood pressure pill medicine in the morning and taking the other one at night. Says that he must admit that many nights he ends up falling asleep without taking that second blood pressure medicine. Today I have told him it is fine to go ahead and take them together in the morning.  He has no other concerns to address today.   05/23/2017: Today he reports that he is having congestion in his head and his chest. Says "It's the same thing that he gets every year at this time of year".  Says that "even his teeth have hurt ".  Been feeling congested in his sinuses and also having increased chest congestion and phlegm.  No known fevers or chills.  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  That he had an injection in his  left shoulder in April.  Says that that orthopedist told him he would need joint replacement surgery.  Says that he does not want to have surgery and that after Christmas may follow-up with Ortho to see if he can get another injection.  He has no other specific concerns today.  Reports that he is taking blood pressure medications as directed.  No lightheadedness or other adverse effects.   No past medical history on file.   Home Meds: Outpatient Medications Prior to Visit  Medication Sig Dispense Refill  . losartan (COZAAR) 100 MG tablet TAKE ONE TABLET BY MOUTH ONCE DAILY. 90 tablet 0  . montelukast (SINGULAIR) 10 MG tablet TAKE 1 TABLET BY MOUTH ONCE DAILY. 30 tablet 3  . amLODipine (NORVASC) 10 MG tablet TAKE 1 TABLET BY MOUTH ONCE DAILY. 30 tablet 0  . cetirizine (ZYRTEC) 10 MG tablet Take 1 tablet (10 mg total) by mouth daily. 30 tablet 3  . TRELEGY ELLIPTA 100-62.5-25 MCG/INH AEPB INHALE  ONE PUFF DAILY. 60 each 0  . VENTOLIN HFA 108 (90 Base) MCG/ACT inhaler INHALE 2 PUFFS INTO THE LUNGS EVERY 4 HOURS AS NEEDED.  18 g 3  . predniSONE (DELTASONE) 20 MG tablet Take 2 daily for 2 days then take 1 daily for 4 days 8 tablet 0  . predniSONE (DELTASONE) 20 MG tablet Take 3 daily for 2 days, then 2 daily for 2 days, then 1 daily for 2 days. 12 tablet 0   No facility-administered medications prior to visit.     Allergies:  Allergies  Allergen Reactions  . Penicillins         Social History   Socioeconomic History  . Marital status: Married    Spouse name: Not on file  . Number of children: Not on file  . Years of education: Not on file  . Highest education level: Not on file  Social Needs  . Financial resource strain: Not on file  . Food insecurity - worry: Not on file  . Food insecurity - inability: Not on file  . Transportation needs - medical: Not on file  . Transportation needs - non-medical: Not on file  Occupational History  . Not on file  Tobacco Use  . Smoking status: Former Smoker    Last attempt to quit: 10/27/2016    Years since quitting: 0.5  . Smokeless tobacco: Never Used  Substance and Sexual Activity  . Alcohol use: No  . Drug use: No  . Sexual activity: Not on file  Other Topics Concern  . Not on file  Social History Narrative  . Not on file    Family History  Problem Relation Age of Onset  . Hypertension Mother   . Arthritis Father   . Stroke Father   . Diabetes Maternal Grandmother      Review of Systems:  See HPI for pertinent ROS. All other ROS negative.    Physical Exam: Blood pressure (!) 164/82, pulse 81, temperature 97.6 F (36.4 C), temperature source Oral, resp. rate 16, height 6\' 2"  (1.88 m), weight 94.7 kg (208 lb 12.8 oz), SpO2 98 %., Body mass index is 26.81 kg/m. General:  WNWD WM. Appears in no acute distress. HEENT: Ear canals are patent and normal bilaterally.  Tympanic membranes appear normal bilaterally.  Posterior pharynx shows no significant erythema and no exudate.  He does have mild tenderness with percussion to maxillary sinuses  bilaterally. Neck: Supple. No thyromegaly. No lymphadenopathy. No carotid bruit. Lungs: Distant, decreased breath sounds throughout bilaterally. Mild wheezes throughout bilaterally. Heart: RRR with S1 S2. No murmurs, rubs, or gallops. Musculoskeletal:  Strength and tone normal for age. Extremities/Skin: Warm and dry. No LE edema.  Neuro: Alert and oriented X 3. Moves all extremities spontaneously. Gait is normal. CNII-XII grossly in tact. Psych:  Responds to questions appropriately with a normal affect.     ASSESSMENT AND PLAN:  71 y.o. year old male with   Essential hypertension 05/23/17:  blood pressure was elevated today and has been elevated at last couple of visits.  At this time will continue the Cozaar 100 mg daily and the Norvasc 10 mg daily.   Will add Bystolic 5 mg daily for him to take in addition.  To return for follow-up visit in 2 weeks to recheck BP. Check bmet to monitor. Addendum Added later on 05/23/2017 at 4: 39 pm---pt called us and reported the Bystolic is very expensive.  --------Will D/C Bystolic --------Will add clonidine 0.1mg  1 po BID  (  It is either this or HCTZ ) Neither are good options. He is already on max dose of Cozaar and Norvasc. Cannot use other beta blockers with his COPD.   Bacterial respiratory infection 05/23/17: To take antibiotic and prednisone as directed.  He is going to have follow-up visit in 2 weeks will make sure this is resolved at that visit.  Follow-up sooner if needed. - azithromycin (ZITHROMAX) 250 MG tablet; Day 1: Take 2 daily.  Days 2-5: Take 1 daily.  Dispense: 6 tablet; Refill: 0 - predniSONE (DELTASONE) 20 MG tablet; Take 3 daily for 2 days, then 2 daily for 2 days, then 1 daily for 2 days.  Dispense: 12 tablet; Refill: 0   Pulmonary emphysema, unspecified emphysema type (HCC) Congratulations on smoking cessation----says that he has not smoked since April 2018 Continue the Zyrtec, Singulair, Trelogy, Albuterol  Need for  hepatitis C screening test 05/23/17:  - Hepatitis C antibody   01/2017: Today did discuss colonoscopy. He states he had a sigmoidoscopy many years ago. He refuses to have a colonoscopy. Low guard and he is agreeable. This information was given to him today.   01/2017: Also asked if he thinks his prior PCP had given him a pneumonia vaccine and he does not think so. Discussed giving pneumonia vaccines but will wait until his current COPD exacerbation is over.  --- Did return and had Prevnar 13 on 05/08/17. -----------------------------------WILL GIVE PNEUMOVAX 23 IN 6 - 12 MONTHS-------------------------------------------------    Signed, Frazier Richards, PA, [facility] 05/23/2017 12:36 PM

## 2017-05-23 NOTE — Addendum Note (Signed)
Addended by: Allayne ButcherIXON, MARY B on: 05/23/2017 04:42 PM   Modules accepted: Orders

## 2017-05-23 NOTE — Telephone Encounter (Signed)
Patient called and stated his Bystolic is going to be $187.00 and is requesting a cheaper medication. I will call insurance company to see if there is a cheaper alternative

## 2017-05-24 LAB — BASIC METABOLIC PANEL WITH GFR
BUN: 21 mg/dL (ref 7–25)
CALCIUM: 9.2 mg/dL (ref 8.6–10.3)
CO2: 27 mmol/L (ref 20–32)
CREATININE: 1.1 mg/dL (ref 0.70–1.25)
Chloride: 104 mmol/L (ref 98–110)
GFR, EST NON AFRICAN AMERICAN: 68 mL/min/{1.73_m2} (ref >=60)
GFR, Est African American: 79 mL/min/{1.73_m2} (ref >=60)
Glucose, Bld: 90 mg/dL (ref 65–99)
POTASSIUM: 4.9 mmol/L (ref 3.5–5.3)
Sodium: 140 mmol/L (ref 135–146)

## 2017-05-24 LAB — HEPATITIS C ANTIBODY
Hepatitis C Ab: NONREACTIVE
SIGNAL TO CUT-OFF: 0.1 (ref <1.00)

## 2017-05-30 ENCOUNTER — Other Ambulatory Visit: Payer: Self-pay | Admitting: Physician Assistant

## 2017-05-30 MED ORDER — LEVOFLOXACIN 500 MG PO TABS: 500.0000 mg | ORAL_TABLET | Freq: Every day | ORAL | 0 refills | Status: DC

## 2017-05-30 MED ORDER — PREDNISONE 20 MG PO TABS: ORAL_TABLET | ORAL | 0 refills | Status: DC

## 2017-05-30 NOTE — Telephone Encounter (Signed)
Levaquin 500mg  1 po QD x 7 days # 7+0 Prednisone 20mg ---Take 2 daily for 5 days  # 10 +0 If Symptoms do not clear up after this, then he needs to come in for f/u visit.

## 2017-05-30 NOTE — Telephone Encounter (Signed)
I spoke with insurance company alternatives for bystolic are as follow:  Bisoprolol fumarate Metoprolol tartrate Atenolol no cost was provided for any of the alternatives

## 2017-05-30 NOTE — Telephone Encounter (Signed)
Patient called and complains of not feeling any better since last OV, Patient states he has nasal drainage, breathing is not better and antibiotic that was prescribed is not working. Pl advise

## 2017-05-30 NOTE — Telephone Encounter (Signed)
This was already addressed on 05/23/17.   I added an addendum to my office note on that date and changed bysystolic to clonidine at that date.

## 2017-05-30 NOTE — Telephone Encounter (Signed)
Patient aware of rx being sent to pharmacy

## 2017-05-31 DIAGNOSIS — M9903 Segmental and somatic dysfunction of lumbar region: Secondary | ICD-10-CM | POA: Diagnosis not present

## 2017-05-31 DIAGNOSIS — M545 Low back pain: Secondary | ICD-10-CM | POA: Diagnosis not present

## 2017-06-06 ENCOUNTER — Ambulatory Visit: Payer: Medicare Other | Admitting: Physician Assistant

## 2017-06-12 DIAGNOSIS — M9903 Segmental and somatic dysfunction of lumbar region: Secondary | ICD-10-CM | POA: Diagnosis not present

## 2017-06-12 DIAGNOSIS — M545 Low back pain: Secondary | ICD-10-CM | POA: Diagnosis not present

## 2017-06-26 DIAGNOSIS — J441 Chronic obstructive pulmonary disease with (acute) exacerbation: Secondary | ICD-10-CM | POA: Diagnosis not present

## 2017-06-27 DIAGNOSIS — M545 Low back pain: Secondary | ICD-10-CM | POA: Diagnosis not present

## 2017-06-27 DIAGNOSIS — M9903 Segmental and somatic dysfunction of lumbar region: Secondary | ICD-10-CM | POA: Diagnosis not present

## 2017-07-04 DIAGNOSIS — M9903 Segmental and somatic dysfunction of lumbar region: Secondary | ICD-10-CM | POA: Diagnosis not present

## 2017-07-04 DIAGNOSIS — M545 Low back pain: Secondary | ICD-10-CM | POA: Diagnosis not present

## 2017-07-09 ENCOUNTER — Ambulatory Visit: Payer: Self-pay | Admitting: Physician Assistant

## 2017-07-09 DIAGNOSIS — M545 Low back pain: Secondary | ICD-10-CM | POA: Diagnosis not present

## 2017-07-09 DIAGNOSIS — M9903 Segmental and somatic dysfunction of lumbar region: Secondary | ICD-10-CM | POA: Diagnosis not present

## 2017-07-11 ENCOUNTER — Ambulatory Visit (INDEPENDENT_AMBULATORY_CARE_PROVIDER_SITE_OTHER): Payer: Medicare Other | Admitting: Physician Assistant

## 2017-07-11 ENCOUNTER — Encounter: Payer: Self-pay | Admitting: Physician Assistant

## 2017-07-11 VITALS — BP 160/92 | HR 118 | Temp 97.6°F | Resp 18 | Wt 206.0 lb

## 2017-07-11 DIAGNOSIS — J988 Other specified respiratory disorders: Secondary | ICD-10-CM

## 2017-07-11 DIAGNOSIS — J0100 Acute maxillary sinusitis, unspecified: Secondary | ICD-10-CM | POA: Diagnosis not present

## 2017-07-11 DIAGNOSIS — I10 Essential (primary) hypertension: Secondary | ICD-10-CM

## 2017-07-11 DIAGNOSIS — B9689 Other specified bacterial agents as the cause of diseases classified elsewhere: Principal | ICD-10-CM

## 2017-07-11 DIAGNOSIS — J439 Emphysema, unspecified: Secondary | ICD-10-CM | POA: Diagnosis not present

## 2017-07-11 MED ORDER — PREDNISONE 20 MG PO TABS: ORAL_TABLET | ORAL | 0 refills | Status: DC

## 2017-07-11 MED ORDER — LEVOFLOXACIN 750 MG PO TABS: 750.0000 mg | ORAL_TABLET | Freq: Every day | ORAL | 0 refills | Status: AC

## 2017-07-11 NOTE — Progress Notes (Signed)
Patient ID: Eric Spencer MRN: [id number], DOB: Feb 22, 1946, 72 y.o. Date of Encounter: 07/11/2017, 2:34 PM    Chief Complaint:  Chief Complaint  Patient presents with  . Nasal Congestion  . Shortness of Breath     HPI: 72 y.o. year old male presents with above.   Today during visit he reviews with me that at his last visit with me I prescribed azithromycin and prednisone and that did not quite get rid of all of his symptoms.   Had to call back and get Levaquin and prednisone and "that knocked it out ".  Says after that, things were great.  He was able to sing in the [place] at church and do his Christmas shopping etc.  However after that, he developed symptoms again.  He ended up having to go to an urgent care on New Year's.  They prescribed azithromycin and prednisone.  States that the symptoms got better but never resolved.  Reports that he does hear/feels some wheezing occasionally even since the treatment at the urgent care. Reports that he does not have any teeth, but when he gets these infections- that even his gums hurt- and states that he is having no symptoms right now.  Had a discussion today about whether these episodes are being caused by infections or allergies.  [place] these types of symptoms/episodes at all.  Have reviewed that a lot of times these episodes occur after he has been mowing grass or  raking leaves.  Also reports that 1 of his recent episodes this past fall happened after helping his son with some hay.  I discussed referring him to an allergist for allergy testing today but he wants to hold off on this.  Says that he will add some Zyrtec and see if that helps.  Is already on Singulair as well as the trilogy.  He reports that the trilogy cost him $300. Told him to talk to the pharmacy or talk to his insurance company to find out which inhaled pulmonary medication they will cover at a lower cost and I  will be happy to adjsut medication.     Home Meds:   Outpatient Medications Prior to Visit  Medication Sig Dispense Refill  . amLODipine (NORVASC) 10 MG tablet Take 1 tablet (10 mg total) by mouth daily. 90 tablet 2  . cloNIDine (CATAPRES) 0.1 MG tablet Take 1 tablet (0.1 mg total) by mouth 2 (two) times daily. 60 tablet 11  . Fluticasone-Umeclidin-Vilant (TRELEGY ELLIPTA) 100-62.5-25 MCG/INH AEPB Inhale 1 Inhaler into the lungs daily. 60 each 5  . losartan (COZAAR) 100 MG tablet TAKE ONE TABLET BY MOUTH ONCE DAILY. 90 tablet 0  . montelukast (SINGULAIR) 10 MG tablet TAKE 1 TABLET BY MOUTH ONCE DAILY. 30 tablet 0  . VENTOLIN HFA 108 (90 Base) MCG/ACT inhaler INHALE 2 PUFFS INTO THE LUNGS EVERY 4 HOURS AS NEEDED. 18 g 3  . levofloxacin (LEVAQUIN) 500 MG tablet Take 1 tablet (500 mg total) by mouth daily. 7 tablet 0  . predniSONE (DELTASONE) 20 MG tablet Take 2 tablets daily for 5 days 10 tablet 0  . azithromycin (ZITHROMAX) 250 MG tablet Day 1: Take 2 daily.  Days 2-5: Take 1 daily. 6 tablet 0   No facility-administered medications prior to visit.     Allergies:  Allergies  Allergen Reactions  . Penicillins       Review of Systems: See HPI  for pertinent ROS. All other ROS negative.    Physical Exam: Blood pressure (!) 160/92, pulse (!) 118, temperature 97.6 F (36.4 C), temperature source Oral, resp. rate 18, weight 93.4 kg (206 lb), SpO2 95 %., Body mass index is 26.45 kg/m. General:  WNWD WM. Appears in no acute distress. HEENT: Normocephalic, atraumatic, eyes without discharge, sclera non-icteric, nares are without discharge. Bilateral auditory canals clear, TM's are without perforation, pearly grey and translucent with reflective cone of light bilaterally. Oral cavity moist, posterior pharynx without exudate, erythema, peritonsillar abscess.  Positive tenderness with percussion to maxillary sinuses bilaterally.  Neck: Supple. No thyromegaly. No lymphadenopathy. Lungs: Clear  bilaterally to auscultation without wheezes, rales, or rhonchi. Breathing is unlabored. Heart: Regular rhythm. No murmurs, rubs, or gallops. Msk:  Strength and tone normal for age. Extremities/Skin: Warm and dry.  Neuro: Alert and oriented X 3. Moves all extremities spontaneously. Gait is normal. CNII-XII grossly in tact. Psych:  Responds to questions appropriately with a normal affect.     ASSESSMENT AND PLAN:  72 y.o. year old male with  1. Bacterial respiratory infection - levofloxacin (LEVAQUIN) 750 MG tablet; Take 1 tablet (750 mg total) by mouth daily for 7 days.  Dispense: 7 tablet; Refill: 0  2. Acute maxillary sinusitis, recurrence not specified He is to take the Levaquin and prednisone as directed.  Follow-up if symptoms do not resolve after completion of these. - levofloxacin (LEVAQUIN) 750 MG tablet; Take 1 tablet (750 mg total) by mouth daily for 7 days.  Dispense: 7 tablet; Refill: 0 - predniSONE (DELTASONE) 20 MG tablet; Take 3 daily for 2 days, then 2 daily for 2 days, then 1 daily for 2 days.  Dispense: 12 tablet; Refill: 0  3. Pulmonary emphysema, unspecified emphysema type (HCC) - levofloxacin (LEVAQUIN) 750 MG tablet; Take 1 tablet (750 mg total) by mouth daily for 7 days.  Dispense: 7 tablet; Refill: 0 - predniSONE (DELTASONE) 20 MG tablet; Take 3 daily for 2 days, then 2 daily for 2 days, then 1 daily for 2 days.  Dispense: 12 tablet; Refill: 0  4. Essential hypertension Blood pressure is elevated.  He states that he just recently saw clonidine in the back of the drawer and realized that he had been off of this and not taking this but states that he will restart this.   Murray HodgkinsSigned, Analys Ryden Beth OtisvilleDixon, [place]PA, [facility] 07/11/2017 2:34 PM

## 2017-07-23 ENCOUNTER — Other Ambulatory Visit: Payer: Self-pay | Admitting: Physician Assistant

## 2017-07-23 DIAGNOSIS — M9903 Segmental and somatic dysfunction of lumbar region: Secondary | ICD-10-CM | POA: Diagnosis not present

## 2017-07-23 DIAGNOSIS — M545 Low back pain: Secondary | ICD-10-CM | POA: Diagnosis not present

## 2017-07-23 DIAGNOSIS — I10 Essential (primary) hypertension: Secondary | ICD-10-CM

## 2017-08-08 DIAGNOSIS — M9903 Segmental and somatic dysfunction of lumbar region: Secondary | ICD-10-CM | POA: Diagnosis not present

## 2017-08-08 DIAGNOSIS — M545 Low back pain: Secondary | ICD-10-CM | POA: Diagnosis not present

## 2017-08-11 DIAGNOSIS — Z6828 Body mass index (BMI) 28.0-28.9, adult: Secondary | ICD-10-CM | POA: Diagnosis not present

## 2017-08-11 DIAGNOSIS — J019 Acute sinusitis, unspecified: Secondary | ICD-10-CM | POA: Diagnosis not present

## 2017-08-11 DIAGNOSIS — J449 Chronic obstructive pulmonary disease, unspecified: Secondary | ICD-10-CM | POA: Diagnosis not present

## 2017-08-15 DIAGNOSIS — M9903 Segmental and somatic dysfunction of lumbar region: Secondary | ICD-10-CM | POA: Diagnosis not present

## 2017-08-15 DIAGNOSIS — M545 Low back pain: Secondary | ICD-10-CM | POA: Diagnosis not present

## 2017-08-28 ENCOUNTER — Other Ambulatory Visit: Payer: Self-pay | Admitting: Physician Assistant

## 2017-08-28 NOTE — Telephone Encounter (Signed)
Refill appropriate 

## 2017-08-29 DIAGNOSIS — M545 Low back pain: Secondary | ICD-10-CM | POA: Diagnosis not present

## 2017-08-29 DIAGNOSIS — M9903 Segmental and somatic dysfunction of lumbar region: Secondary | ICD-10-CM | POA: Diagnosis not present

## 2017-09-07 ENCOUNTER — Ambulatory Visit (INDEPENDENT_AMBULATORY_CARE_PROVIDER_SITE_OTHER): Payer: Medicare Other

## 2017-09-07 DIAGNOSIS — Z23 Encounter for immunization: Secondary | ICD-10-CM

## 2017-09-07 NOTE — Progress Notes (Signed)
Patient was in office for flu vaccine. Patient received high dose flu vaccine in his left deltoid. Patient tolerated well

## 2017-09-11 DIAGNOSIS — M9903 Segmental and somatic dysfunction of lumbar region: Secondary | ICD-10-CM | POA: Diagnosis not present

## 2017-09-11 DIAGNOSIS — M545 Low back pain: Secondary | ICD-10-CM | POA: Diagnosis not present

## 2017-09-24 DIAGNOSIS — M545 Low back pain: Secondary | ICD-10-CM | POA: Diagnosis not present

## 2017-09-24 DIAGNOSIS — M9903 Segmental and somatic dysfunction of lumbar region: Secondary | ICD-10-CM | POA: Diagnosis not present

## 2017-09-27 ENCOUNTER — Other Ambulatory Visit: Payer: Self-pay | Admitting: Physician Assistant

## 2017-10-03 ENCOUNTER — Other Ambulatory Visit: Payer: Self-pay | Admitting: Physician Assistant

## 2017-10-03 DIAGNOSIS — J439 Emphysema, unspecified: Secondary | ICD-10-CM

## 2017-10-05 DIAGNOSIS — M9903 Segmental and somatic dysfunction of lumbar region: Secondary | ICD-10-CM | POA: Diagnosis not present

## 2017-10-05 DIAGNOSIS — M545 Low back pain: Secondary | ICD-10-CM | POA: Diagnosis not present

## 2017-10-09 ENCOUNTER — Ambulatory Visit (INDEPENDENT_AMBULATORY_CARE_PROVIDER_SITE_OTHER): Payer: Medicare Other | Admitting: Family Medicine

## 2017-10-09 ENCOUNTER — Encounter: Payer: Self-pay | Admitting: Family Medicine

## 2017-10-09 VITALS — BP 160/70 | HR 84 | Temp 97.7°F | Resp 16 | Ht 74.0 in | Wt 216.0 lb

## 2017-10-09 DIAGNOSIS — J441 Chronic obstructive pulmonary disease with (acute) exacerbation: Secondary | ICD-10-CM | POA: Diagnosis not present

## 2017-10-09 DIAGNOSIS — B370 Candidal stomatitis: Secondary | ICD-10-CM | POA: Diagnosis not present

## 2017-10-09 DIAGNOSIS — R0602 Shortness of breath: Secondary | ICD-10-CM | POA: Diagnosis not present

## 2017-10-09 DIAGNOSIS — J45901 Unspecified asthma with (acute) exacerbation: Secondary | ICD-10-CM

## 2017-10-09 DIAGNOSIS — J449 Chronic obstructive pulmonary disease, unspecified: Secondary | ICD-10-CM | POA: Diagnosis not present

## 2017-10-09 MED ORDER — FLUTICASONE PROPIONATE 50 MCG/ACT NA SUSP: 2.0000 | Freq: Every day | NASAL | 6 refills | Status: DC

## 2017-10-09 MED ORDER — ALBUTEROL SULFATE (2.5 MG/3ML) 0.083% IN NEBU: 2.5000 mg | INHALATION_SOLUTION | Freq: Four times a day (QID) | RESPIRATORY_TRACT | 1 refills | Status: DC | PRN

## 2017-10-09 MED ORDER — IPRATROPIUM-ALBUTEROL 0.5-2.5 (3) MG/3ML IN SOLN
3.0000 mL | Freq: Once | RESPIRATORY_TRACT | Status: AC
  Administered 2017-10-09: 3 mL via RESPIRATORY_TRACT

## 2017-10-09 MED ORDER — LEVOFLOXACIN 500 MG PO TABS: 500.0000 mg | ORAL_TABLET | Freq: Every day | ORAL | 0 refills | Status: DC

## 2017-10-09 MED ORDER — NYSTATIN 100000 UNIT/ML MT SUSP: 5.0000 mL | Freq: Four times a day (QID) | OROMUCOSAL | 0 refills | Status: DC

## 2017-10-09 MED ORDER — CETIRIZINE HCL 10 MG PO TABS
10.0000 mg | ORAL_TABLET | Freq: Every day | ORAL | 11 refills | Status: DC
Start: 2017-10-09 — End: 2017-10-25

## 2017-10-09 MED ORDER — PREDNISONE 20 MG PO TABS: ORAL_TABLET | ORAL | 0 refills | Status: DC

## 2017-10-09 NOTE — Progress Notes (Signed)
Walked patient around the office 3 times watching oxygen level. Patient oxygen level dropped to 90 HR 113

## 2017-10-09 NOTE — Progress Notes (Signed)
Patient ID: Eric Spencer, male    DOB: 10/15/1945, 72 y.o.   MRN: [id number]  PCP: Dorena Bodo, PA-C  Chief Complaint  Patient presents with  . Shortness of Breath  . Wheezing  . referral to pulmonary    [facility]     Subjective:   Eric Spencer is a 72 y.o. male, presents to clinic with CC of worsening shortness of breath cough and wheeze, worse from his baseline.  Patient does have a diagnosis of COPD, former smoker with 30-pack-year history, also had asthma as a child which worsened 8 years ago when moving back to this area.  Currently his COPD is managed on trelegy and albuterol rescue inhaler.  He states that over the past 2 months his symptoms have worsened with exertional shortness of breath with walking up and down his driveway.  Over the past 1-2 weeks with warmer weather he has had worsening seasonal allergies and is using his rescue inhaler 3-4 times a day and waking up in the middle the night very short of breath.  He has increased nasal drainage and productive sputum.  He typically has a COPD exacerbation at this time of year that usually resolved with Levaquin (prescribed by his former PCP).    Pt currently on Trelegy, states it does not work as well as what he was on previously, Spiriva and Breo He also believes he has thrush with sores and patches in his mouth that hurt. He denies chest pain, sweats, fever, chills, syncope, palpitations, lower extremity edema, weight loss, chest pressure, orthopnea.   Patient Active Problem List   Diagnosis Date Noted  . COPD (chronic obstructive pulmonary disease) (HCC) 01/13/2016  . Essential hypertension 01/13/2016  . Smoker 01/13/2016  . Peripheral neuropathy 01/13/2016     Prior to Admission medications   Medication Sig Start Date End Date Taking? Authorizing Provider  amLODipine (NORVASC) 10 MG tablet Take 1 tablet (10 mg total) by mouth daily. 05/23/17  Yes Allayne Butcher B, PA-C  cloNIDine (CATAPRES) 0.1 MG  tablet Take 1 tablet (0.1 mg total) by mouth 2 (two) times daily. 05/23/17 05/23/18 Yes Dixon, Patriciaann Clan, PA-C  Fluticasone-Umeclidin-Vilant (TRELEGY ELLIPTA) 100-62.5-25 MCG/INH AEPB Inhale 1 Inhaler into the lungs daily. 05/23/17  Yes Dixon, Mary B, PA-C  losartan (COZAAR) 100 MG tablet TAKE ONE TABLET BY MOUTH ONCE DAILY. 07/23/17  Yes Dixon, Mary B, PA-C  montelukast (SINGULAIR) 10 MG tablet TAKE 1 TABLET BY MOUTH ONCE DAILY. 09/27/17  Yes Dixon, Mary B, PA-C  VENTOLIN HFA 108 (90 Base) MCG/ACT inhaler INHALE 2 PUFFS INTO THE LUNGS EVERY 4 HOURS AS NEEDED. 10/03/17  Yes Allayne Butcher B, PA-C  albuterol (PROVENTIL) (2.5 MG/3ML) 0.083% nebulizer solution Take 3 mLs (2.5 mg total) by nebulization every 6 (six) hours as needed for wheezing or shortness of breath. 10/09/17   Danelle Berry, PA-C  cetirizine (ZYRTEC) 10 MG tablet Take 1 tablet (10 mg total) by mouth daily. 10/09/17   Danelle Berry, PA-C  fluticasone (FLONASE) 50 MCG/ACT nasal spray Place 2 sprays into both nostrils daily. 10/09/17   Danelle Berry, PA-C  levofloxacin (LEVAQUIN) 500 MG tablet Take 1 tablet (500 mg total) by mouth daily. 10/09/17   Danelle Berry, PA-C  nystatin (MYCOSTATIN) 100000 UNIT/ML suspension Take 5 mLs (500,000 Units total) by mouth 4 (four) times daily. Swish in mouth and then swallow 10/09/17   Danelle Berry, PA-C  predniSONE (DELTASONE) 20 MG tablet Take 3 daily for 2 days,  then 2 daily for 3 days, then 1 daily for 3 days. 10/09/17   Danelle Berryapia, Britanee Vanblarcom, PA-C     Allergies  Allergen Reactions  . Penicillins      Family History  Problem Relation Age of Onset  . Hypertension Mother   . Arthritis Father   . Stroke Father   . Diabetes Maternal Grandmother      Social History   Socioeconomic History  . Marital status: Married    Spouse name: Not on file  . Number of children: Not on file  . Years of education: Not on file  . Highest education level: Not on file  Occupational History  . Not on file  Social Needs  .  Financial resource strain: Not on file  . Food insecurity:    Worry: Not on file    Inability: Not on file  . Transportation needs:    Medical: Not on file    Non-medical: Not on file  Tobacco Use  . Smoking status: Former Smoker    Last attempt to quit: 10/27/2016    Years since quitting: 0.9  . Smokeless tobacco: Never Used  Substance and Sexual Activity  . Alcohol use: No  . Drug use: No  . Sexual activity: Not on file  Lifestyle  . Physical activity:    Days per week: Not on file    Minutes per session: Not on file  . Stress: Not on file  Relationships  . Social connections:    Talks on phone: Not on file    Gets together: Not on file    Attends religious service: Not on file    Active member of club or organization: Not on file    Attends meetings of clubs or organizations: Not on file    Relationship status: Not on file  . Intimate partner violence:    Fear of current or ex partner: Not on file    Emotionally abused: Not on file    Physically abused: Not on file    Forced sexual activity: Not on file  Other Topics Concern  . Not on file  Social History Narrative  . Not on file     Review of Systems  Constitutional: Positive for activity change and fatigue. Negative for appetite change, chills, diaphoresis, fever and unexpected weight change.  HENT: Positive for congestion, postnasal drip and rhinorrhea.   Eyes: Negative.   Respiratory: Positive for cough, chest tightness, shortness of breath and wheezing. Negative for apnea, choking and stridor.   Cardiovascular: Negative for chest pain, palpitations and leg swelling.  Gastrointestinal: Negative.   Endocrine: Negative.   Genitourinary: Negative.   Musculoskeletal: Negative.   Skin: Negative.  Negative for color change, pallor and rash.  Allergic/Immunologic: Positive for environmental allergies. Negative for food allergies and immunocompromised state.  Neurological: Negative.   Hematological: Negative.     Psychiatric/Behavioral: Negative.        Objective:    Vitals:   10/09/17 1148  BP: (!) 160/70  Pulse: 84  Resp: 16  Temp: 97.7 F (36.5 C)  TempSrc: Oral  SpO2: 97%  Weight: 216 lb (98 kg)  Height: 6\' 2"  (1.88 m)        Physical Exam  Constitutional: He is oriented to person, place, and time. He appears well-developed and well-nourished.  Non-toxic appearance. No distress.  Frequent cough, mildly ill-appearing, nontoxic, no acute distress  HENT:  Head: Normocephalic and atraumatic.  Right Ear: Tympanic membrane, external ear and ear canal normal.  Left Ear: Tympanic membrane, external ear and ear canal normal.  Nose: No mucosal edema or rhinorrhea. Right sinus exhibits no maxillary sinus tenderness and no frontal sinus tenderness. Left sinus exhibits no maxillary sinus tenderness and no frontal sinus tenderness.  Mouth/Throat: Uvula is midline. No trismus in the jaw. No uvula swelling. No oropharyngeal exudate, posterior oropharyngeal edema or posterior oropharyngeal erythema.  Posterior oropharynx erythematous, no exudate Soft and hard palate with white patches surrounded by erythema  Eyes: Pupils are equal, round, and reactive to light. Conjunctivae, EOM and lids are normal.  Neck: Trachea normal, normal range of motion and phonation normal. Neck supple. No JVD present. No tracheal deviation present.  Cardiovascular: Normal rate, regular rhythm, normal heart sounds, intact distal pulses and normal pulses. Exam reveals no gallop, no friction rub and no decreased pulses.  No murmur heard. Pulses:      Radial pulses are 2+ on the right side, and 2+ on the left side.       Posterior tibial pulses are 2+ on the right side, and 2+ on the left side.  No lower extremity edema  Pulmonary/Chest: Effort normal. He has wheezes. He has no rhonchi. He exhibits no tenderness.  Increased AP diameter Diminished breath sounds throughout Frequent cough with wheeze and scattered  rhonchi No retractions No tachypnea  Abdominal: Soft. Normal appearance and bowel sounds are normal. He exhibits no distension. There is no tenderness. There is no rebound and no guarding.  Musculoskeletal: Normal range of motion. He exhibits no edema.  Lymphadenopathy:    He has cervical adenopathy.  Neurological: He is alert and oriented to person, place, and time. He exhibits normal muscle tone. Coordination and gait normal.  Skin: Skin is warm, dry and intact. Capillary refill takes less than 2 seconds. No rash noted. He is not diaphoretic. No erythema.  Psychiatric: He has a normal mood and affect. His speech is normal and behavior is normal.  Nursing note and vitals reviewed.         Assessment & Plan:   1. Shortness of breath - ipratropium-albuterol (DUONEB) 0.5-2.5 (3) MG/3ML nebulizer solution 3 mL After breathing treatment breath sounds improved with increased wheeze and persistent scattered rhonchi Is ambulated around clinic with pulse oximetry, lowest 90%   - DME Nebulizer machine Patient requires nebulizer treatments for acute exacerbation of COPD with increased shortness of breath, no relief at home with multiple inhalers, did improve in clinic after nebulizer treatment  2. Chronic obstructive pulmonary disease, unspecified COPD type (HCC) No documentation of current COPD stage, per his sx today, with several months of increasing baseline shortness of breath with short distances of ambulation, and increasing frequency of exacerbations suspect patient is stage II-III He is already on triple therapy, needs PFT's when not acutely exacerbated. 30 + pack year hx, pt stopped smoking one year ago, despite this he states his hx have worsened over the past year and noticably in the past 2 months, with illness/allergies causing further daily, frequent SOB over the past 1-2 weeks - Ambulatory referral to Pulmonology - DME Nebulizer machine - pt requires nebulizer albuterol tx for  COPD, acute exacerbation of COPD, hx of asthma, currently SOB Rx prednisone, albuterol nebulizer, levaquin Allergy controlled meds maxed out - antihistamine, nasal spray steroid, singulair  3. Acute exacerbation of COPD with asthma (HCC) Rx prednisone, albuterol nebulizer, levaquin  4. Oral thrush nystain 4x a day, swish and swallow Educated re ICS to swish and spit to prevent thrush.  Ambulatory oximetry - 90%, short of breath after three laps around clinic   Pt given samples of Breo and Spiriva, administration of inhalers reviewed. Pulm referral done - Pt requested Dr. Delton Coombes.   Danelle Berry, PA-C 10/09/17 3:24 PM

## 2017-10-09 NOTE — Patient Instructions (Signed)
After using inhaler with steroid swish and spit to get steroids out of your mouth.      How to Use a Dry Powder Inhaler, Adult A dry powder inhaler (DPI) is a handheld device for taking medicine that must be breathed into the lungs (inhaled). The device is also called a disk inhaler. You get one dose each time you inhale the medicine. Most adults use an inhaler two times each day. Your health care provider will tell you how often to use your DPI. You may need this inhaler as part of your long-term treatment for asthma or chronic obstructive lung (pulmonary) disease (COPD). The inhaler contains medicine to open your breathing tubes or decrease swelling in those tubes. What are the risks? If you do not use your inhaler correctly, medicine might not reach your lungs to help you breathe. Inhaler medicine can cause side effects, such as:  Mouth infection.  Throat infection.  Cough.  Hoarseness.  Headache.  Nausea and vomiting.  Lung infection (pneumonia) in people with COPD.  Supplies needed: The medicine that you will need comes in the inhaler. Each device contains the amount of medicine needed for a set number of uses (inhalations). The inhaler is the only supply that you need to inhale your medicine. How to use a dry powder inhaler 1. Make sure you remove any gum or candy from your mouth. 2. Stand or sit up straight. 3. Hold the inhaler in one hand. 4. Place the thumb of your other hand in the thumb grip of the inhaler, and push your thumb away from you as far as it will go. This opens the inhaler for use. 5. Hold the inhaler flat, like a hamburger, with the mouthpiece facing you. 6. Use your other hand to slide the lever that is near the mouthpiece away from you until you hear a click. This loads the medicine. 7. Turn your head and breathe out. Do not breathe out into the mouthpiece. 8. Turn your head back to the mouthpiece and seal your lips around it. 9. Take a deep breath  through your mouth. Do not breathe through your nose. 10. Hold your breath for 10 seconds. 11. Remove the inhaler from your mouth, turn your head, and breathe out slowly. 12. Close the inhaler by sliding the thumb grip back to the closed position. 13. Check the dose indicator number on the inhaler. It should go down by one dose each time you use it. 14. Rinse your mouth with water. Follow these instructions at home:  Take your inhaled medicine only as told by your health care provider.  Keep all follow-up visits as told by your health care provider. This is important. General recommendations  Do not drop or shake your inhaler.  Do not wash your inhaler. If the mouthpiece gets dirty, use a dry cloth to wipe it clean. The medicine in your inhaler is in powder form and needs to be kept dry.  Do not breathe into the inhaler.  Store your inhaler in a dry place at room temperature. Do not store it in your bathroom.  Keep track of your doses. When the dose number turns red, you have five doses left. That means it is time to pick up a new inhaler at your pharmacy. When the dose number shows zero (0), throw the inhaler away. Contact a health care provider if:  You have a sore mouth.  You have a sore throat.  You have a persistent cough.  Your voice changes  or gets hoarse.  You have a fever.  You have frequent headaches, nausea, or vomiting after starting to use your inhaler.  Your asthma or COPD symptoms have not improved after you have been using your inhaler for a few weeks. Get help right away if:  You have severe shortness of breath.  You have difficulty breathing. Summary  A dry powder inhaler (DPI) is a handheld device that contains medicine that should help you breathe better.  Your health care provider will prescribe the strength of the medicine and how often you inhale it. This varies from person to person.  Carefully follow instructions from your health care provider  about using your inhaler.  Keep your inhaler dry. The medicine in the device is a powder and must be kept dry.  If you have any questions about how to use your inhaler, talk with your health care provider or pharmacist. This information is not intended to replace advice given to you by your health care provider. Make sure you discuss any questions you have with your health care provider. Document Released: 06/09/2000 Document Revised: 03/01/2016 Document Reviewed: 01/05/2016 Elsevier Interactive Patient Education  2018 [employer].   Chronic Obstructive Pulmonary Disease Chronic obstructive pulmonary disease (COPD) is a long-term (chronic) condition that affects the lungs. COPD is a general term that can be used to describe many different lung problems that cause lung swelling (inflammation) and limit airflow, including chronic bronchitis and emphysema. If you have COPD, your lung function will probably never return to normal. In most cases, it gets worse over time. However, there are steps you can take to slow the progression of the disease and improve your quality of life. What are the causes? This condition may be caused by:  Smoking. This is the most common cause.  Certain genes passed down through families.  What increases the risk? The following factors may make you more likely to develop this condition:  Secondhand smoke from cigarettes, pipes, or cigars.  Exposure to chemicals and other irritants such as fumes and dust in the work environment.  Chronic lung conditions or infections.  What are the signs or symptoms? Symptoms of this condition include:  Shortness of breath, especially during physical activity.  Chronic cough with a large amount of thick mucus. Sometimes the cough may not have any mucus (dry cough).  Wheezing.  Rapid breaths.  Gray or bluish discoloration (cyanosis) of the skin, especially in your fingers, toes, or lips.  Feeling tired  (fatigue).  Weight loss.  Chest tightness.  Frequent infections.  Episodes when breathing symptoms become much worse (exacerbations).  Swelling in the ankles, feet, or legs. This may occur in later stages of the disease.  How is this diagnosed? This condition is diagnosed based on:  Your medical history.  A physical exam.  You may also have tests, including:  Lung (pulmonary) function tests. This may include a spirometry test, which measures your ability to exhale properly.  Chest X-ray.  CT scan.  Blood tests.  How is this treated? This condition may be treated with:  Medicines. These may include inhaled rescue medicines to treat acute exacerbations as well as long-term, or maintenance, medicines to prevent flare-ups of COPD. ? Bronchodilators help treat COPD by dilating the airways to allow increased airflow and make your breathing more comfortable. ? Steroids can reduce airway inflammation and help prevent exacerbations.  Smoking cessation. If you smoke, your health care provider may ask you to quit, and may also recommend therapy  or replacement products to help you quit.  Pulmonary rehabilitation. This may involve working with a team of health care providers and specialists, such as respiratory, occupational, and physical therapists.  Exercise and physical activity. These are beneficial for nearly all people with COPD.  Nutrition therapy to gain weight, if you are underweight.  Oxygen. Supplemental oxygen therapy is only helpful if you have a low oxygen level in your blood (hypoxemia).  Lung surgery or transplant.  Palliative care. This is to help people with COPD feel comfortable when treatment is no longer working.  Follow these instructions at home: Medicines  Take over-the-counter and prescription medicines (inhaled or pills) only as told by your health care provider.  Talk to your health care provider before taking any cough or allergy medicines. You  may need to avoid certain medicines that dry out your airways. Lifestyle  If you are a smoker, the most important thing that you can do is to stop smoking. Do not use any products that contain nicotine or tobacco, such as cigarettes and e-cigarettes. If you need help quitting, ask your health care provider. Continuing to smoke will cause the disease to progress faster.  Avoid exposure to things that irritate your lungs, such as smoke, chemicals, and fumes.  Stay active, but balance activity with periods of rest. Exercise and physical activity will help you maintain your ability to do things you want to do.  Learn and use relaxation techniques to manage stress and to control your breathing.  Get the right amount of sleep and get quality sleep. Most adults need 7 or more hours per night.  Eat healthy foods. Eating smaller, more frequent meals and resting before meals may help you maintain your strength. Controlled breathing Learn and use controlled breathing techniques as directed by your health care provider. Controlled breathing techniques include:  Pursed lip breathing. Start by breathing in (inhaling) through your nose for 1 second. Then, purse your lips as if you were going to whistle and breathe out (exhale) through the pursed lips for 2 seconds.  Diaphragmatic breathing. Start by putting one hand on your abdomen just above your waist. Inhale slowly through your nose. The hand on your abdomen should move out. Then purse your lips and exhale slowly. You should be able to feel the hand on your abdomen moving in as you exhale.  Controlled coughing Learn and use controlled coughing to clear mucus from your lungs. Controlled coughing is a series of short, progressive coughs. The steps of controlled coughing are: 1. Lean your head slightly forward. 2. Breathe in deeply using diaphragmatic breathing. 3. Try to hold your breath for 3 seconds. 4. Keep your mouth slightly open while coughing  twice. 5. Spit any mucus out into a tissue. 6. Rest and repeat the steps once or twice as needed.  General instructions  Make sure you receive all the vaccines that your health care provider recommends, especially the pneumococcal and influenza vaccines. Preventing infection and hospitalization is very important when you have COPD.  Use oxygen therapy and pulmonary rehabilitation if directed to by your health care provider. If you require home oxygen therapy, ask your health care provider whether you should purchase a pulse oximeter to measure your oxygen level at home.  Work with your health care provider to develop a COPD action plan. This will help you know what steps to take if your condition gets worse.  Keep other chronic health conditions under control as told by your health care provider.  Avoid extreme temperature and humidity changes.  Avoid contact with people who have an illness that spreads from person to person (is contagious), such as viral infections or pneumonia.  Keep all follow-up visits as told by your health care provider. This is important. Contact a health care provider if:  You are coughing up more mucus than usual.  There is a change in the color or thickness of your mucus.  Your breathing is more labored than usual.  Your breathing is faster than usual.  You have difficulty sleeping.  You need to use your rescue medicines or inhalers more often than expected.  You have trouble doing routine activities such as getting dressed or walking around the house. Get help right away if:  You have shortness of breath while you are resting.  You have shortness of breath that prevents you from: ? Being able to talk. ? Performing your usual physical activities.  You have chest pain lasting longer than 5 minutes.  Your skin color is more blue (cyanotic) than usual.  You measure low oxygen saturations for longer than 5 minutes with a pulse oximeter.  You have  a fever.  You feel too tired to breathe normally. Summary  Chronic obstructive pulmonary disease (COPD) is a long-term (chronic) condition that affects the lungs.  Your lung function will probably never return to normal. In most cases, it gets worse over time. However, there are steps you can take to slow the progression of the disease and improve your quality of life.  Treatment for COPD may include taking medicines, quitting smoking, pulmonary rehabilitation, and changes to diet and exercise. As the disease progresses, you may need oxygen therapy, a lung transplant, or palliative care.  To help manage your condition, do not smoke, avoid exposure to things that irritate your lungs, stay up to date on all vaccines, and follow your health care provider's instructions for taking medicines. This information is not intended to replace advice given to you by your health care provider. Make sure you discuss any questions you have with your health care provider. Document Released: 03/22/2005 Document Revised: 07/17/2016 Document Reviewed: 07/17/2016 Elsevier Interactive Patient Education  2018 [employer].    Oral Thrush, Adult Oral thrush, also called oral candidiasis, is a fungal infection that develops in the mouth and throat and on the tongue. It causes white patches to form on the mouth and tongue. Eric Spencer is most common in older adults, but it can occur at any age. Many cases of thrush are mild, but this infection can also be serious. Eric Spencer can be a repeated (recurrent) problem for certain people who have a weak body defense system (immune system). The weakness can be caused by chronic illnesses, or by taking medicines that limit the body's ability to fight infection. If a person has difficulty fighting infection, the fungus that causes thrush can spread through the body. This can cause life-threatening blood or organ infections. What are the causes? This condition is caused by a fungus  (yeast) called Candida albicans.  This fungus is normally present in small amounts in the mouth and on other mucous membranes. It usually causes no harm.  If conditions are present that allow the fungus to grow without control, it invades surrounding tissues and becomes an infection.  Other Candida species can also lead to thrush (rare).  What increases the risk? This condition is more likely to develop in:  People with a weakened immune system.  Older adults.  People with HIV (human  immunodeficiency virus).  People with diabetes.  People with dry mouth (xerostomia).  Pregnant women.  People with poor dental care, especially people who have false teeth.  People who use antibiotic medicines.  What are the signs or symptoms? Symptoms of this condition can vary from mild and moderate to severe and persistent. Symptoms may include:  A burning feeling in the mouth and throat. This can occur at the start of a thrush infection.  White patches that stick to the mouth and tongue. The tissue around the patches may be red, raw, and painful. If rubbed (during tooth brushing, for example), the patches and the tissue of the mouth may bleed easily.  A bad taste in the mouth or difficulty tasting foods.  A cottony feeling in the mouth.  Pain during eating and swallowing.  Poor appetite.  Cracking at the corners of the mouth.  How is this diagnosed? This condition is diagnosed based on:  Physical exam. Your health care provider will look in your mouth.  Health history. Your health care provider will ask you questions about your health.  How is this treated? This condition is treated with medicines called antifungals, which prevent the growth of fungi. These medicines are either applied directly to the affected area (topical) or swallowed (oral). The treatment will depend on the severity of the condition. Mild thrush Mild cases of thrush may clear up with the use of an antifungal  mouth rinse or lozenges. Treatment usually lasts about 14 days. Moderate to severe thrush  More severe thrush infections that have spread to the esophagus are treated with an oral antifungal medicine. A topical antifungal medicine may also be used.  For some severe infections, treatment may need to continue for more than 14 days.  Oral antifungal medicines are rarely used during pregnancy because they may be harmful to the unborn child. If you are pregnant, talk with your health care provider about options for treatment. Persistent or recurrent thrush For cases of thrush that do not go away or keep coming back:  Treatment may be needed twice as long as the symptoms last.  Treatment will include both oral and topical antifungal medicines.  People with a weakened immune system can take an antifungal medicine on a continuous basis to prevent thrush infections.  It is important to treat conditions that make a person more likely to get thrush, such as diabetes or HIV. Follow these instructions at home: Medicines  Take over-the-counter and prescription medicines only as told by your health care provider.  Talk with your health care provider about an over-the-counter medicine called gentian violet, which kills bacteria and fungi. Relieving soreness and discomfort To help reduce the discomfort of thrush:  Drink cold liquids such as water or iced tea.  Try flavored ice treats or frozen juices.  Eat foods that are easy to swallow, such as gelatin, ice cream, or custard.  Try drinking from a straw if the patches in your mouth are painful.  General instructions  Eat plain, unflavored yogurt as directed by your health care provider. Check the label to make sure the yogurt contains live cultures. This yogurt can help healthy bacteria to grow in the mouth and can stop the growth of the fungus that causes thrush.  If you wear dentures, remove the dentures before going to bed, brush them  vigorously, and soak them in a cleaning solution as directed by your health care provider.  Rinse your mouth with a warm salt-water mixture several times a  day. To make a salt-water mixture, completely dissolve 1/2-1 tsp of salt in 1 cup of warm water. Contact a health care provider if:  Your symptoms are getting worse or are not improving within 7 days of starting treatment.  You have symptoms of a spreading infection, such as white patches on the skin outside of the mouth. This information is not intended to replace advice given to you by your health care provider. Make sure you discuss any questions you have with your health care provider. Document Released: 03/07/2004 Document Revised: 03/06/2016 Document Reviewed: 03/06/2016 Elsevier Interactive Patient Education  2017 [employer].

## 2017-10-15 ENCOUNTER — Telehealth: Payer: Self-pay

## 2017-10-15 NOTE — Telephone Encounter (Signed)
Patient called and left a message stating his symptoms were starting to improve with the antibiotic but now they are starting back up he is having difficulty with his breathing and was wondering if another round of antibiotics would help. Patient states his inhaler is not really helping Pls advise

## 2017-10-16 DIAGNOSIS — M9903 Segmental and somatic dysfunction of lumbar region: Secondary | ICD-10-CM | POA: Diagnosis not present

## 2017-10-16 DIAGNOSIS — M545 Low back pain: Secondary | ICD-10-CM | POA: Diagnosis not present

## 2017-10-17 NOTE — Telephone Encounter (Signed)
Call placed to patient no answer.

## 2017-10-17 NOTE — Telephone Encounter (Signed)
Patient returned call and states when he go outside that is when he will start to experience SOB patient has an appointment 5/1 with pulmonary and will continue to take allergy medication and if his symptoms does not improve by the end of the week he will make an appointment to be seen

## 2017-10-17 NOTE — Telephone Encounter (Signed)
He would need to be seen again for me to safely advise him.  If he has shortness of breath that is worse, if he has distress, meaning he cannot walk because of it, or cannot speak in short sentences, and breathing treatments don't help, he needs to go to the ER.  If he is not in distress he should come in for a follow up with [facility] or myself.  Or we can call pulmonology and see if we can get him in there faster.

## 2017-10-18 ENCOUNTER — Emergency Department (HOSPITAL_COMMUNITY): Payer: Medicare Other

## 2017-10-18 ENCOUNTER — Other Ambulatory Visit: Payer: Self-pay

## 2017-10-18 ENCOUNTER — Ambulatory Visit (INDEPENDENT_AMBULATORY_CARE_PROVIDER_SITE_OTHER): Payer: Medicare Other | Admitting: Family Medicine

## 2017-10-18 ENCOUNTER — Encounter: Payer: Self-pay | Admitting: Family Medicine

## 2017-10-18 ENCOUNTER — Encounter (HOSPITAL_COMMUNITY): Payer: Self-pay | Admitting: Emergency Medicine

## 2017-10-18 ENCOUNTER — Emergency Department (HOSPITAL_COMMUNITY)
Admission: EM | Admit: 2017-10-18 | Discharge: 2017-10-18 | Disposition: A | Discharge status: Home / Self Care | Payer: Medicare Other | Attending: Emergency Medicine | Admitting: Emergency Medicine

## 2017-10-18 VITALS — BP 138/78 | HR 124 | Temp 99.8°F | Resp 18 | Ht 74.0 in | Wt 208.0 lb

## 2017-10-18 DIAGNOSIS — R0602 Shortness of breath: Secondary | ICD-10-CM

## 2017-10-18 DIAGNOSIS — I10 Essential (primary) hypertension: Secondary | ICD-10-CM | POA: Insufficient documentation

## 2017-10-18 DIAGNOSIS — J441 Chronic obstructive pulmonary disease with (acute) exacerbation: Secondary | ICD-10-CM

## 2017-10-18 DIAGNOSIS — B370 Candidal stomatitis: Secondary | ICD-10-CM | POA: Diagnosis not present

## 2017-10-18 DIAGNOSIS — Z79899 Other long term (current) drug therapy: Secondary | ICD-10-CM | POA: Insufficient documentation

## 2017-10-18 DIAGNOSIS — R6889 Other general symptoms and signs: Secondary | ICD-10-CM | POA: Diagnosis not present

## 2017-10-18 DIAGNOSIS — Z87891 Personal history of nicotine dependence: Secondary | ICD-10-CM | POA: Diagnosis not present

## 2017-10-18 DIAGNOSIS — R05 Cough: Secondary | ICD-10-CM | POA: Diagnosis not present

## 2017-10-18 HISTORY — DX: Unspecified osteoarthritis, unspecified site: M19.90

## 2017-10-18 HISTORY — DX: Essential (primary) hypertension: I10

## 2017-10-18 HISTORY — DX: Chronic obstructive pulmonary disease, unspecified: J44.9

## 2017-10-18 LAB — BASIC METABOLIC PANEL
Anion gap: 10 (ref 5–15)
BUN: 28 mg/dL — ABNORMAL HIGH (ref 6–20)
CO2: 28 mmol/L (ref 22–32)
Calcium: 8.8 mg/dL — ABNORMAL LOW (ref 8.9–10.3)
Chloride: 98 mmol/L — ABNORMAL LOW (ref 101–111)
Creatinine, Ser: 1.46 mg/dL — ABNORMAL HIGH (ref 0.61–1.24)
GFR calc Af Amer: 54 mL/min — ABNORMAL LOW (ref >60)
GFR, EST NON AFRICAN AMERICAN: 47 mL/min — AB (ref >60)
GLUCOSE: 106 mg/dL — AB (ref 65–99)
POTASSIUM: 4.6 mmol/L (ref 3.5–5.1)
Sodium: 136 mmol/L (ref 135–145)

## 2017-10-18 LAB — CBC
HEMATOCRIT: 38.8 % — AB (ref 39.0–52.0)
Hemoglobin: 12.4 g/dL — ABNORMAL LOW (ref 13.0–17.0)
MCH: 30.4 pg (ref 26.0–34.0)
MCHC: 32 g/dL (ref 30.0–36.0)
MCV: 95.1 fL (ref 78.0–100.0)
Platelets: 216 10*3/uL (ref 150–400)
RBC: 4.08 MIL/uL — ABNORMAL LOW (ref 4.22–5.81)
RDW: 15.6 % — AB (ref 11.5–15.5)
WBC: 14.4 10*3/uL — ABNORMAL HIGH (ref 4.0–10.5)

## 2017-10-18 LAB — INFLUENZA A AND B AG, IMMUNOASSAY
INFLUENZA A ANTIGEN: NOT DETECTED
INFLUENZA B ANTIGEN: NOT DETECTED

## 2017-10-18 MED ORDER — IPRATROPIUM-ALBUTEROL 0.5-2.5 (3) MG/3ML IN SOLN
3.0000 mL | Freq: Once | RESPIRATORY_TRACT | Status: AC
  Administered 2017-10-18: 3 mL via RESPIRATORY_TRACT

## 2017-10-18 MED ORDER — ALBUTEROL SULFATE (2.5 MG/3ML) 0.083% IN NEBU: 5.0000 mg | INHALATION_SOLUTION | Freq: Once | RESPIRATORY_TRACT | Status: DC

## 2017-10-18 MED ORDER — LEVOFLOXACIN 500 MG PO TABS: 500.0000 mg | ORAL_TABLET | Freq: Every day | ORAL | 0 refills | Status: DC

## 2017-10-18 MED ORDER — METHYLPREDNISOLONE ACETATE 80 MG/ML IJ SUSP
80.0000 mg | Freq: Once | INTRAMUSCULAR | Status: AC
  Administered 2017-10-18: 80 mg via INTRAMUSCULAR

## 2017-10-18 MED ORDER — BENZONATATE 100 MG PO CAPS: 100.0000 mg | ORAL_CAPSULE | Freq: Three times a day (TID) | ORAL | 0 refills | Status: DC | PRN

## 2017-10-18 MED ORDER — FLUCONAZOLE 150 MG PO TABS: ORAL_TABLET | ORAL | 1 refills | Status: DC

## 2017-10-18 MED ORDER — IPRATROPIUM-ALBUTEROL 0.5-2.5 (3) MG/3ML IN SOLN
3.0000 mL | Freq: Once | RESPIRATORY_TRACT | Status: AC
  Administered 2017-10-18: 3 mL via RESPIRATORY_TRACT
  Filled 2017-10-18: qty 3

## 2017-10-18 MED ORDER — ALBUTEROL SULFATE (2.5 MG/3ML) 0.083% IN NEBU
2.5000 mg | INHALATION_SOLUTION | Freq: Once | RESPIRATORY_TRACT | Status: AC
Start: 2017-10-18 — End: 2017-10-18
  Administered 2017-10-18: 2.5 mg via RESPIRATORY_TRACT

## 2017-10-18 MED ORDER — PREDNISONE 10 MG PO TABS: 10.0000 mg | ORAL_TABLET | Freq: Every day | ORAL | 0 refills | Status: DC

## 2017-10-18 NOTE — Patient Instructions (Signed)
Pt going to ER  Oral thrush now needs oral medication, failed topical  Follow-up with pulmonology next week however you may see them in the hospital  Flu negative

## 2017-10-18 NOTE — Discharge Instructions (Addendum)
Chest x-ray showed no obvious pneumonia.  Continue your nebulizer treatments.  Prescription for antibiotic and prednisone.  Mucinex for congestion.

## 2017-10-18 NOTE — ED Notes (Signed)
Have placed pt on 2l o2 du to sats dropping to 90%

## 2017-10-18 NOTE — Progress Notes (Signed)
Patient ID: Eric Spencer, male    DOB: 02/28/1946, 72 y.o.   MRN: [id number]  PCP: Dorena Bodo, PA-C  Chief Complaint  Patient presents with  . SOB    Subjective:   Eric Spencer is a 72 y.o. male, former smoker, history of hypertension and COPD, presents to clinic with CC of SOB, cough with sputum production, subjective fever, "feeling flushed" x 2 days, seems to worsen after allergies exacerbated over the weekend.  He is feeling short of breath, worse with exertion, no improvement with his home nebulizer treatment, last done about a hour and a half ago. Since getting nebs at home he has been doing them regularly every 4-6 hours.  He took Tylenol and her breathing treatment about 90 minutes before coming to the appointment today.  Like he has a flu. Patient has been too short of breath to go upstairs to sleep at night because he gets too short of breath walking to the restroom so he has been sleeping downstairs on the couch.  Him and his wife state that he has been sleeping propped up on pillows for several years and he cannot sleep flat.  He also complains of worsening back pain comes from an old injury in his spine, and this has been bothering him for the past several days.  He denies any chest pain, chest pressure, near-syncope, palpitations, PND, lower extremity edema.  His wife denies any confusion states he is weak and short of breath  Inhalers were adjusted at his last appointment to [facility] and spiriva, which pt stated worked better for him than the trelegy inhaler.  At home nebulizer with albuterol nebs were ordered and obtained for him.  Pulmonology referral was ordered and he does have an appointment coming up next week.  He was treated with Levaquin and a steroid taper and states that he felt much better and was doing very well until last weekend, was up to work in the yard and get ready for Easter outside, when his allergies flared up 5 days ago with URI symptoms,  congestion, cough, acutely worsening 2 days ago with SOB,    Visit HPI 10/09/17 Eric Spencer is a 72 y.o. male, presents to clinic with CC of worsening shortness of breath cough and wheeze, worse from his baseline.  Patient does have a diagnosis of COPD, former smoker with 30-pack-year history, also had asthma as a child which worsened 8 years ago when moving back to this area.  Currently his COPD is managed on trelegy and albuterol rescue inhaler.  He states that over the past 2 months his symptoms have worsened with exertional shortness of breath with walking up and down his driveway.  Over the past 1-2 weeks with warmer weather he has had worsening seasonal allergies and is using his rescue inhaler 3-4 times a day and waking up in the middle the night very short of breath.  He has increased nasal drainage and productive sputum.  He typically has a COPD exacerbation at this time of year that usually resolved with Levaquin (prescribed by his former PCP).    Pt currently on Trelegy, states it does not work as well as what he was on previously, Spiriva and Breo.  He also believes he has thrush with sores and patches in his mouth that hurt. He denies chest pain, sweats, fever, chills, syncope, palpitations, lower extremity edema, weight loss, chest pressure, orthopnea.    Patient Active Problem List  Diagnosis Date Noted  . COPD (chronic obstructive pulmonary disease) (HCC) 01/13/2016  . Essential hypertension 01/13/2016  . Smoker 01/13/2016  . Peripheral neuropathy 01/13/2016     Prior to Admission medications   Medication Sig Start Date End Date Taking? Authorizing Provider  albuterol (PROVENTIL) (2.5 MG/3ML) 0.083% nebulizer solution Take 3 mLs (2.5 mg total) by nebulization every 6 (six) hours as needed for wheezing or shortness of breath. 10/09/17  Yes Danelle Berry, PA-C  amLODipine (NORVASC) 10 MG tablet Take 1 tablet (10 mg total) by mouth daily. 05/23/17  Yes Dorena Bodo, PA-C    cetirizine (ZYRTEC) 10 MG tablet Take 1 tablet (10 mg total) by mouth daily. 10/09/17  Yes Danelle Berry, PA-C  cloNIDine (CATAPRES) 0.1 MG tablet Take 1 tablet (0.1 mg total) by mouth 2 (two) times daily. 05/23/17 05/23/18 Yes Allayne Butcher B, PA-C  fluticasone (FLONASE) 50 MCG/ACT nasal spray Place 2 sprays into both nostrils daily. 10/09/17  Yes Danelle Berry, PA-C  Fluticasone-Umeclidin-Vilant (TRELEGY ELLIPTA) 100-62.5-25 MCG/INH AEPB Inhale 1 Inhaler into the lungs daily. 05/23/17  Yes Dixon, Mary B, PA-C  losartan (COZAAR) 100 MG tablet TAKE ONE TABLET BY MOUTH ONCE DAILY. 07/23/17  Yes Dixon, Mary B, PA-C  montelukast (SINGULAIR) 10 MG tablet TAKE 1 TABLET BY MOUTH ONCE DAILY. 09/27/17  Yes Allayne Butcher B, PA-C  nystatin (MYCOSTATIN) 100000 UNIT/ML suspension Take 5 mLs (500,000 Units total) by mouth 4 (four) times daily. Swish in mouth and then swallow 10/09/17  Yes Danelle Berry, PA-C  VENTOLIN HFA 108 (90 Base) MCG/ACT inhaler INHALE 2 PUFFS INTO THE LUNGS EVERY 4 HOURS AS NEEDED. 10/03/17  Yes Allayne Butcher B, PA-C  levofloxacin (LEVAQUIN) 500 MG tablet Take 1 tablet (500 mg total) by mouth daily. Patient not taking: Reported on 10/18/2017 10/09/17   Danelle Berry, PA-C  predniSONE (DELTASONE) 20 MG tablet Take 3 daily for 2 days, then 2 daily for 3 days, then 1 daily for 3 days. Patient not taking: Reported on 10/18/2017 10/09/17   Danelle Berry, PA-C     Allergies  Allergen Reactions  . Penicillins      Family History  Problem Relation Age of Onset  . Hypertension Mother   . Arthritis Father   . Stroke Father   . Diabetes Maternal Grandmother      Social History   Socioeconomic History  . Marital status: Married    Spouse name: Not on file  . Number of children: Not on file  . Years of education: Not on file  . Highest education level: Not on file  Occupational History  . Not on file  Social Needs  . Financial resource strain: Not on file  . Food insecurity:    Worry: Not on  file    Inability: Not on file  . Transportation needs:    Medical: Not on file    Non-medical: Not on file  Tobacco Use  . Smoking status: Former Smoker    Last attempt to quit: 10/27/2016    Years since quitting: 0.9  . Smokeless tobacco: Never Used  Substance and Sexual Activity  . Alcohol use: No  . Drug use: No  . Sexual activity: Not on file  Lifestyle  . Physical activity:    Days per week: Not on file    Minutes per session: Not on file  . Stress: Not on file  Relationships  . Social connections:    Talks on phone: Not on file    Gets together: Not  on file    Attends religious service: Not on file    Active member of club or organization: Not on file    Attends meetings of clubs or organizations: Not on file    Relationship status: Not on file  . Intimate partner violence:    Fear of current or ex partner: Not on file    Emotionally abused: Not on file    Physically abused: Not on file    Forced sexual activity: Not on file  Other Topics Concern  . Not on file  Social History Narrative  . Not on file     Review of Systems  Constitutional: Positive for activity change, chills, fatigue and fever. Negative for appetite change, diaphoresis and unexpected weight change.  HENT: Positive for congestion, mouth sores, postnasal drip, rhinorrhea, sneezing, sore throat and voice change. Negative for sinus pressure, sinus pain and trouble swallowing.   Eyes: Positive for itching. Negative for photophobia, pain, discharge, redness and visual disturbance.  Respiratory: Positive for cough, shortness of breath and wheezing. Negative for apnea, choking, chest tightness and stridor.   Cardiovascular: Negative.  Negative for chest pain and leg swelling.  Gastrointestinal: Negative.  Negative for abdominal distention, abdominal pain, blood in stool, constipation, diarrhea, nausea and vomiting.  Endocrine: Negative.   Genitourinary: Negative.   Musculoskeletal: Positive for back  pain. Negative for arthralgias, gait problem, joint swelling and myalgias.  Skin: Negative.  Negative for color change, pallor and rash.  Neurological: Negative for dizziness, tremors, syncope, light-headedness, numbness and headaches.  Hematological: Negative.   Psychiatric/Behavioral: Negative.  Negative for confusion.  All other systems reviewed and are negative.      Objective:    Vitals:   10/18/17 1021 10/18/17 1026  BP: 138/78   Pulse: (!) 116   Resp: (!) 22 20  Temp: 99.8 F (37.7 C)   TempSrc: Oral   SpO2: (!) 86% 92%  Weight: 208 lb (94.3 kg)   Height: 6\' 2"  (1.88 m)       Physical Exam  Constitutional: He is oriented to person, place, and time. He appears well-developed and well-nourished.  Non-toxic appearance. He does not appear ill. He appears distressed.  Mildly ill appearing, tachypneic, elderly male, alert, able to speak in short sentences   HENT:  Head: Normocephalic and atraumatic.  Right Ear: Tympanic membrane, external ear and ear canal normal.  Left Ear: Tympanic membrane, external ear and ear canal normal.  Nose: No mucosal edema or rhinorrhea. Right sinus exhibits no maxillary sinus tenderness and no frontal sinus tenderness. Left sinus exhibits no maxillary sinus tenderness and no frontal sinus tenderness.  Mouth/Throat: Uvula is midline. No trismus in the jaw. No uvula swelling. No oropharyngeal exudate, posterior oropharyngeal edema or posterior oropharyngeal erythema.  Nasal discharge with mild erythema White patches in throat worse than prior, erythematous No cyanosis to hard palate  Eyes: Pupils are equal, round, and reactive to light. Conjunctivae, EOM and lids are normal. Right eye exhibits no discharge. Left eye exhibits no discharge.  Neck: Trachea normal, normal range of motion and phonation normal. Neck supple. No JVD present. No tracheal deviation present.  Cardiovascular: Normal heart sounds, intact distal pulses and normal pulses. An  irregular rhythm present. Tachycardia present. Exam reveals no gallop and no friction rub.  No murmur heard. Pulses:      Radial pulses are 2+ on the right side, and 2+ on the left side.       Posterior tibial pulses are 2+ on  the right side, and 2+ on the left side.  No LE edema  Pulmonary/Chest: No stridor. He has no wheezes. He has no rhonchi. He has no rales. He exhibits no tenderness.  Tachypneic, frequent cough, no accessory muscle use, able to speak in short sentences, breath sounds throughout decreased, increased AP diameter, no wheeze rales or rhonchi is auscultated  Abdominal: Soft. Normal appearance and bowel sounds are normal. He exhibits no distension. There is no tenderness. There is no rebound and no guarding.  Musculoskeletal: Normal range of motion. He exhibits no edema.  Lymphadenopathy:    He has no cervical adenopathy.  Neurological: He is alert and oriented to person, place, and time. He exhibits normal muscle tone. Coordination and gait normal.  Skin: Skin is warm, dry and intact. Capillary refill takes less than 2 seconds. No rash noted. He is not diaphoretic.  Warm to the touch, no diaphoresis  Psychiatric: He has a normal mood and affect. His speech is normal and behavior is normal.  Nursing note and vitals reviewed.    EKG:  Sinus Tachycardia with bigeminal PACs, non-specific t wave abnormality, no ST elevation or depression, reviewed with Dr. Tanya Nones      Assessment & Plan:      ICD-10-CM   1. Shortness of breath R06.02 albuterol (PROVENTIL) (2.5 MG/3ML) 0.083% nebulizer solution 2.5 mg    ipratropium-albuterol (DUONEB) 0.5-2.5 (3) MG/3ML nebulizer solution 3 mL    methylPREDNISolone acetate (DEPO-MEDROL) injection 80 mg    DG Chest 2 View    Brain natriuretic peptide    EKG 12-Lead    EKG 12-Lead  2. Flu-like symptoms R68.89 Influenza A and B Ag, Immunoassay  3. Oral thrush B37.0 fluconazole (DIFLUCAN) 150 MG tablet  4. Acute exacerbation of chronic  obstructive pulmonary disease (COPD) (HCC) J44.1 CBC with Differential    benzonatate (TESSALON) 100 MG capsule    CANCELED: Basic Metabolic Panel     Pt at presentation was initially tachypneic, desat to 86%, tachycardic after walking in to exam room on RA Quickly evaluated his lungs, he had moderate work of breathing but was able to speak in short sentences, frequent cough, some scattered rhonchi in his lungs he was quickly put on 2L O2 via , given DuoNeb and additional albuterol nebulizer treatments and steroid injection, on oxygen his pulse ox increased to 95% when at rest after the treatments, and then on room air he was able to maintain a 91% when at rest, was ambulated and dropped to 89% with ambulation.  He seemed to be doing better briefly after treatments Some serial vital signs documented below but refer to nursing notes as well for more detailed information   Vitals:   10/18/17 1057 10/18/17 1113 10/18/17 1116 10/18/17 1117  BP:      Pulse: 96 96 (!) 102 98  Resp: 18 16 (!) 22 18  Temp:      TempSrc:      SpO2: 95% 91% (!) 89% 91%  Weight:      Height:       Patient appears to have severe allergy triggers he is already maxed out on allergy controlling medications.  He states that pollen is an instant irritant to his URI symptoms and his breathing, he had similar exacerbation last year the same time year.  I advised speaking to pulmonology about this, may need short-term injections, for now continue all of his allergy medications and he needs to wear a mask if he goes outside or he  needs to avoid his triggers.  He endorsed subjective fever and feeling flulike for the past 2 days he did take Tylenol before he presented here temp was so flu was obtained.  Suspect another acute exacerbation of COPD, allergy mediated.  Will obtain CXR.    I have advised the patient and his wife at length to go to the ER for evaluation if his breathing is labored if his heart rate is fast and is not  improved with a nebulizer treatment at home.  Also expressed that he becomes slightly confused at all or has any near syncopal episodes he needs to call 911 and also go to the ER for evaluation I did explain the nature of hypercapnia to them, and acute on chronic lung disease.  Patient did just get a home nebulizer.  Do not see any other pulmonary test done in the past, he has not had home oxygen in the past.  With acute exacerbation he is wanting oxygen at home however I did explain to him how acute illness it is not safe to have oxygen at home and that is usually only done for baseline oxygen demand when desaturating on a good day with a specific walking test dropping below 88% in office.    11:46 AM I reevaluated the patient again and did pulse oximetry with ambulation his heart rate was 124 and oxygen dropped to 82% with short amount of walking down the hall he was too dyspneic to continue, he also stated that while sitting at rest he was becoming somnolent walking his pulse ox was at 90% on room air.  He has heart rate is a irregular rate order EKG to decide between calling ambulance or sending to the ER with his wife.  EKG obtained appears to have frequent PAC's, no ischemic changes, tachy, copy given to pt to take to ER On room air currently leaving the clinic he is 95% with heart rate 110-115, alert, no CP.  Going with wife. Patient and wife state they will go straight to [facility] ER for evaluation for COPD exacerbation, respiratory distress With any confusion or somnolence, chest pain or shortness of breath that worsens acutely they understand the need to call 911  Flu was negative  Meds ordered this encounter  Medications  . albuterol (PROVENTIL) (2.5 MG/3ML) 0.083% nebulizer solution 2.5 mg  . ipratropium-albuterol (DUONEB) 0.5-2.5 (3) MG/3ML nebulizer solution 3 mL  . methylPREDNISolone acetate (DEPO-MEDROL) injection 80 mg  . benzonatate (TESSALON) 100 MG capsule    Sig: Take 1  capsule (100 mg total) by mouth 3 (three) times daily as needed for cough.    Dispense:  30 capsule    Refill:  0    Order Specific Question:   Supervising Provider    Answer:   Salley Scarlet [[id number]]  . fluconazole (DIFLUCAN) 150 MG tablet    Sig: Take one tablet (150 mg) by mouth today, repeat in one week if not improved    Dispense:  2 tablet    Refill:  1    Order Specific Question:   Supervising Provider    Answer:   Salley Scarlet [[id number]]   Report given to [facility] RN @ [facility] 12:14 PM   Danelle Berry, PA-C 10/18/17 10:31 AM

## 2017-10-18 NOTE — Progress Notes (Signed)
Patient seen in office for SOB.   Noted O2 sat at 86% on RA, HR 116 upon arrival. Placed O2 at 2L/min via [place]. O2 rose to 92%, HR 98. Neulizer treatments given. O2 rose to 95%, HR 96.   O2 removed and sats dropped to 91%, HR 96. Ambulated patient and sats dropped to 89%, HR 102. Repeated check at rest and noted O2 91%, HR 98.

## 2017-10-18 NOTE — ED Notes (Signed)
Audible expiratory wheezes as well

## 2017-10-18 NOTE — ED Notes (Signed)
Patient transported to X-ray 

## 2017-10-18 NOTE — ED Triage Notes (Signed)
[facility], was given a shot and albuterol at office x 1 week

## 2017-10-18 NOTE — ED Notes (Signed)
Respiratory notified of pt going to xray and neb tx

## 2017-10-19 ENCOUNTER — Telehealth: Payer: Self-pay | Admitting: Family Medicine

## 2017-10-19 ENCOUNTER — Other Ambulatory Visit: Payer: Self-pay | Admitting: Physician Assistant

## 2017-10-19 DIAGNOSIS — I10 Essential (primary) hypertension: Secondary | ICD-10-CM

## 2017-10-19 NOTE — Telephone Encounter (Signed)
Tried to call no answer. No voicemail set up 

## 2017-10-19 NOTE — Telephone Encounter (Signed)
Thank you!  Glad he is breathing better!

## 2017-10-19 NOTE — Telephone Encounter (Signed)
Spoke with patient and patient stated that he is feeling much better while taking medications prescribed at the ER. He is able to breathe better and he has been able to sleep. He stated that he will still come in and follow up next Tuesday because he want to see you instead of any other providers.

## 2017-10-19 NOTE — ED Provider Notes (Signed)
[facility] EMERGENCY DEPARTMENT Provider Note   CSN: [id number] Arrival date & time: 10/18/17  1219     History   Chief Complaint Chief Complaint  Patient presents with  . Shortness of Breath    HPI Eric Spencer is a 72 y.o. male.  Patient with a known history of COPD presents with persistent coughing and wheezing for 1 week.  Was seen in the primary care office within the past 24 hours and given intramuscular steroids and breathing treatment.  He continues to use his nebulizer treatment at home.  No fever, sweats, chills, rusty sputum.  This is a long-standing problem.  Severity is moderate.     Past Medical History:  Diagnosis Date  . Arthritis   . COPD (chronic obstructive pulmonary disease) (HCC)   . Hypertension     Patient Active Problem List   Diagnosis Date Noted  . COPD (chronic obstructive pulmonary disease) (HCC) 01/13/2016  . Essential hypertension 01/13/2016  . Smoker 01/13/2016  . Peripheral neuropathy 01/13/2016    Past Surgical History:  Procedure Laterality Date  . HERNIA REPAIR          Home Medications    Prior to Admission medications   Medication Sig Start Date End Date Taking? Authorizing Provider  albuterol (PROVENTIL) (2.5 MG/3ML) 0.083% nebulizer solution Take 3 mLs (2.5 mg total) by nebulization every 6 (six) hours as needed for wheezing or shortness of breath. 10/09/17  Yes Danelle Berry, PA-C  amLODipine (NORVASC) 10 MG tablet Take 1 tablet (10 mg total) by mouth daily. 05/23/17  Yes Allayne Butcher B, PA-C  fluticasone (FLONASE) 50 MCG/ACT nasal spray Place 2 sprays into both nostrils daily. 10/09/17  Yes Danelle Berry, PA-C  fluticasone (FLOVENT DISKUS) 50 MCG/BLIST diskus inhaler Inhale 1 puff into the lungs 2 (two) times daily.   Yes [provider]  Fluticasone-Umeclidin-Vilant (TRELEGY ELLIPTA) 100-62.5-25 MCG/INH AEPB Inhale into the lungs.   Yes [provider]  losartan (COZAAR) 100 MG tablet TAKE ONE TABLET  BY MOUTH ONCE DAILY. 07/23/17  Yes Dixon, Mary B, PA-C  montelukast (SINGULAIR) 10 MG tablet TAKE 1 TABLET BY MOUTH ONCE DAILY. 09/27/17  Yes Allayne Butcher B, PA-C  nystatin (MYCOSTATIN) 100000 UNIT/ML suspension Take 5 mLs (500,000 Units total) by mouth 4 (four) times daily. Swish in mouth and then swallow 10/09/17  Yes Danelle Berry, PA-C  VENTOLIN HFA 108 (90 Base) MCG/ACT inhaler INHALE 2 PUFFS INTO THE LUNGS EVERY 4 HOURS AS NEEDED. 10/03/17  Yes Dixon, Mary B, PA-C  benzonatate (TESSALON) 100 MG capsule Take 1 capsule (100 mg total) by mouth 3 (three) times daily as needed for cough. Patient not taking: Reported on 10/18/2017 10/18/17   Danelle Berry, PA-C  cetirizine (ZYRTEC) 10 MG tablet Take 1 tablet (10 mg total) by mouth daily. Patient not taking: Reported on 10/18/2017 10/09/17   Danelle Berry, PA-C  cloNIDine (CATAPRES) 0.1 MG tablet Take 1 tablet (0.1 mg total) by mouth 2 (two) times daily. Patient not taking: Reported on 10/18/2017 05/23/17 05/23/18  Dorena Bodo, PA-C  fluconazole (DIFLUCAN) 150 MG tablet Take one tablet (150 mg) by mouth today, repeat in one week if not improved Patient not taking: Reported on 10/18/2017 10/18/17   Danelle Berry, PA-C  Fluticasone-Umeclidin-Vilant (TRELEGY ELLIPTA) 100-62.5-25 MCG/INH AEPB Inhale 1 Inhaler into the lungs daily. 05/23/17   Dorena Bodo, PA-C  levofloxacin (LEVAQUIN) 500 MG tablet Take 1 tablet (500 mg total) by mouth daily. 10/18/17   Donnetta Hutching, MD  predniSONE (DELTASONE) 10 MG tablet Take 1 tablet (10 mg total) by mouth daily with breakfast. 3 tablets for 4 days, 2 tablets for 4 days, 1 tablet for 4 days. 10/18/17   Donnetta Hutching, MD    Family History Family History  Problem Relation Age of Onset  . Hypertension Mother   . Arthritis Father   . Stroke Father   . Diabetes Maternal Grandmother     Social History Social History   Tobacco Use  . Smoking status: Former Smoker    Last attempt to quit: 10/27/2016    Years since quitting: 0.9  .  Smokeless tobacco: Never Used  Substance Use Topics  . Alcohol use: No  . Drug use: No     Allergies   Penicillins   Review of Systems Review of Systems  All other systems reviewed and are negative.    Physical Exam Updated Vital Signs BP 101/73   Pulse 91   Temp 98.3 F (36.8 C) (Oral)   Resp (!) 25   Wt 94.3 kg (208 lb)   SpO2 96%   BMI 26.71 kg/m   Physical Exam  Constitutional: He is oriented to person, place, and time. He appears well-developed and well-nourished.  HENT:  Head: Normocephalic and atraumatic.  Eyes: Conjunctivae are normal.  Neck: Neck supple.  Cardiovascular: Normal rate and regular rhythm.  Pulmonary/Chest: Effort normal.  Bilateral expiratory wheezes  Abdominal: Soft. Bowel sounds are normal.  Musculoskeletal: Normal range of motion.  Neurological: He is alert and oriented to person, place, and time.  Skin: Skin is warm and dry.  Psychiatric: He has a normal mood and affect. His behavior is normal.  Nursing note and vitals reviewed.    ED Treatments / Results  Labs (all labs ordered are listed, but only abnormal results are displayed) Labs Reviewed  BASIC METABOLIC PANEL - Abnormal; Notable for the following components:      Result Value   Chloride 98 (*)    Glucose, Bld 106 (*)    BUN 28 (*)    Creatinine, Ser 1.46 (*)    Calcium 8.8 (*)    GFR calc non Af Amer 47 (*)    GFR calc Af Amer 54 (*)    All other components within normal limits  CBC - Abnormal; Notable for the following components:   WBC 14.4 (*)    RBC 4.08 (*)    Hemoglobin 12.4 (*)    HCT 38.8 (*)    RDW 15.6 (*)    All other components within normal limits    EKG EKG Interpretation  Date/Time:  Thursday October 18 2017 12:43:04 EDT Ventricular Rate:  105 PR Interval:    QRS Duration: 103 QT Interval:  317 QTC Calculation: 419 R Axis:   62 Text Interpretation:  Sinus tachycardia with irregular rate Abnormal R-wave progression, early transition  Nonspecific T abnormalities, lateral leads Confirmed by Donnetta Hutching ([id number]) on 10/18/2017 1:50:26 PM   Radiology Dg Chest 2 View  Result Date: 10/18/2017 CLINICAL DATA:  Shortness of breath and congestion. EXAM: CHEST - 2 VIEW COMPARISON:  Chest x-ray dated Oct 30, 2016. FINDINGS: The heart size and mediastinal contours are within normal limits. Normal pulmonary vascularity. Atherosclerotic calcification of the aortic arch. The lungs remain hyperinflated. No focal consolidation, pleural effusion, or pneumothorax. No acute osseous abnormality. IMPRESSION: COPD.  No active cardiopulmonary disease. Electronically Signed   By: Obie Dredge M.D.   On: 10/18/2017 13:35    Procedures Procedures (including critical  care time)  Medications Ordered in ED Medications  ipratropium-albuterol (DUONEB) 0.5-2.5 (3) MG/3ML nebulizer solution 3 mL (3 mLs Nebulization Given 10/18/17 1331)     Initial Impression / Assessment and Plan / ED Course  I have reviewed the triage vital signs and the nursing notes.  Pertinent labs & imaging results that were available during my care of the patient were reviewed by me and considered in my medical decision making (see chart for details).     Patient with known COPD presents with persistent coughing and wheezing.  Chest x-ray negative for pneumonia.  He was given nebulizer treatments in the emergency department.  His primary care doctor typically orders antibiotics when he gets a flareup.  Discharge medications prednisone and Levaquin 500 mg  Final Clinical Impressions(s) / ED Diagnoses   Final diagnoses:  COPD exacerbation [facility](HCC)    ED Discharge Orders        Ordered    levofloxacin (LEVAQUIN) 500 MG tablet  Daily     10/18/17 1609    predniSONE (DELTASONE) 10 MG tablet  Daily with breakfast     10/18/17 1609       Donnetta Hutchingook, Vida Nicol, MD 10/19/17 1423

## 2017-10-19 NOTE — Telephone Encounter (Signed)
ER follow up call-  Need to check in on Mr. Eric Spencer today to see how he is feeling and breathing.  Also need to schedule a close follow up with him today if he wants, or Monday with whoever is available.  I have talked to MB about him.  COPD exacerbation x 2 over the past 2-3 weeks Noted with respirations leaving ER yesterday ~25  He was dropping SpO2 down to 82% in the office, which was worse than the first time I saw him.  Hopefully big steroid dose has started working and his breathing is better, but just need to follow up cause his labwork and vital signs are slightly worrisome to me and would like to keep in close contact.    Review with pt or with his wife concerning things to go back to the ER for  SOB, cannot speak in short sentences, near syncope, new excessive sleepiness or decreased alertness, new confusion.  Can take mucinex for the cough.  Do his inhalers and albuterol.  Do steroids and antibiotics from the ER.  Need to do the diflucan that I prescribed him yesterday.   Tylenol for fever.    F/up appt on Monday.  Or call us after hours or over the weekend to talk to provider if unsure.   Can do his nebulizer every 4-6 hours as needed for SOB

## 2017-10-23 ENCOUNTER — Ambulatory Visit: Payer: Medicare Other | Admitting: Family Medicine

## 2017-10-24 DIAGNOSIS — M545 Low back pain: Secondary | ICD-10-CM | POA: Diagnosis not present

## 2017-10-24 DIAGNOSIS — M9903 Segmental and somatic dysfunction of lumbar region: Secondary | ICD-10-CM | POA: Diagnosis not present

## 2017-10-25 ENCOUNTER — Encounter: Payer: Self-pay | Admitting: Internal Medicine

## 2017-10-25 ENCOUNTER — Ambulatory Visit (INDEPENDENT_AMBULATORY_CARE_PROVIDER_SITE_OTHER): Payer: Medicare Other | Admitting: Internal Medicine

## 2017-10-25 VITALS — BP 126/84 | HR 124 | Ht 74.0 in | Wt 204.0 lb

## 2017-10-25 DIAGNOSIS — J441 Chronic obstructive pulmonary disease with (acute) exacerbation: Secondary | ICD-10-CM | POA: Diagnosis not present

## 2017-10-25 DIAGNOSIS — I10 Essential (primary) hypertension: Secondary | ICD-10-CM

## 2017-10-25 MED ORDER — FAMOTIDINE 20 MG PO TABS: ORAL_TABLET | ORAL | 11 refills | Status: DC

## 2017-10-25 MED ORDER — BUDESONIDE-FORMOTEROL FUMARATE 160-4.5 MCG/ACT IN AERO: 2.0000 | INHALATION_SPRAY | Freq: Two times a day (BID) | RESPIRATORY_TRACT | 11 refills | Status: DC

## 2017-10-25 MED ORDER — PANTOPRAZOLE SODIUM 40 MG PO TBEC: 40.0000 mg | DELAYED_RELEASE_TABLET | Freq: Every day | ORAL | 2 refills | Status: DC

## 2017-10-25 MED ORDER — NEBIVOLOL HCL 10 MG PO TABS: 10.0000 mg | ORAL_TABLET | Freq: Every day | ORAL | 0 refills | Status: DC

## 2017-10-25 MED ORDER — FLUTTER DEVI: 1.0000 | 0 refills | Status: AC | PRN

## 2017-10-25 MED ORDER — IPRATROPIUM BROMIDE 0.02 % IN SOLN: 0.5000 mg | RESPIRATORY_TRACT | 12 refills | Status: DC | PRN

## 2017-10-25 NOTE — Patient Instructions (Addendum)
Plan A = Automatic =  symbicort 160 Take 2 puffs first thing in am and then another 2 puffs about 12 hours later.   Work on inhaler technique:  relax and gently blow all the way out then take a nice smooth deep breath back in, triggering the inhaler at same time you start breathing in.  Hold for up to 5 seconds if you can. Blow out thru nose. Rinse and gargle with water when done      Plan B = Backup Only use your albuterol as a rescue medication to be used if you can't catch your breath by resting or doing a relaxed purse lip breathing pattern.  - The less you use it, the better it will work when you need it. - Ok to use the inhaler up to 2 puffs  every 4 hours if you must but call for appointment if use goes up over your usual need - Don't leave home without it !!  (think of it like the spare tire for your car)   Plan C = Crisis - only use your albuterol/ipatropium  nebulizer if you first try Plan B and it fails to help > ok to use the nebulizer up to every 4 hours but if start needing it regularly call for immediate appointment   For cough >  mucinex dm 1200 mg every 12 hours and use the flutter valve as needed    Stop losartan and start bystolic 10 mg daily until return    Pantoprazole (protonix) 40 mg   Take  30-60 min before first meal of the day and Pepcid (famotidine)  20 mg one @  bedtime until return to office - this is the best way to tell whether stomach acid is contributing to your problem.     GERD (REFLUX)  is an extremely common cause of respiratory symptoms just like yours , many times with no obvious heartburn at all.    It can be treated with medication, but also with lifestyle changes including elevation of the head of your bed (ideally with 6 inch  bed blocks),  Smoking cessation, avoidance of late meals, excessive alcohol, and avoid fatty foods, chocolate, peppermint, colas, red wine, and acidic juices such as orange juice.  NO MINT OR MENTHOL PRODUCTS SO NO COUGH  DROPS   USE SUGARLESS CANDY INSTEAD (Jolley ranchers or Stover's or Life Savers) or even ice chips will also do - the key is to swallow to prevent all throat clearing. NO OIL BASED VITAMINS - use powdered substitutes.    Please schedule a follow up office visit in 2 weeks, sooner if needed  with all medications /inhalers/ solutions in hand so we can verify exactly what you are taking. This includes all medications from all doctors and over the counters

## 2017-10-25 NOTE — Assessment & Plan Note (Signed)
10/25/2017  After extensive coaching inhaler device  effectiveness =    75% try symb 160 2bid and stop trelegy    In the setting of respiratory symptoms of unknown etiology,  It would be preferable to use bystolic, the most beta -1  selective Beta blocker available in sample form, with bisoprolol the most selective generic choice  on the market, at least on a trial basis, to make sure the spillover Beta 2 effects of the less specific Beta blockers are not contributing to this patient's symptoms.   Try bystolic 10 mg po now and q am

## 2017-10-25 NOTE — Assessment & Plan Note (Addendum)
10/25/2017  After extensive coaching inhaler device  effectiveness =    75% try symb 160 2bid and stop trelegy   DDX of  difficult airways management almost all start with A and  include Adherence, Ace Inhibitors, Acid Reflux, Active Sinus Disease, Alpha 1 Antitripsin deficiency, Anxiety masquerading as Airways dz,  ABPA,  Allergy(esp in young), Aspiration (esp in elderly), Adverse effects of meds,  Active smokers, A bunch of PE's (a small clot burden can't cause this syndrome unless there is already severe underlying pulm or vascular dz with poor reserve) plus two Bs  = Bronchiectasis and Beta blocker use..and one C= CHF  Adherence is always the initial "prime suspect" and is a multilayered concern that requires a "trust but verify" approach in every patient - starting with knowing how to use medications, especially inhalers, correctly, keeping up with refills and understanding the fundamental difference between maintenance and prns vs those medications only taken for a very short course and then stopped and not refilled.  - see hfa teaching - return with all meds in hand using a trust but verify approach to confirm accurate Medication  Reconciliation The principal here is that until we are certain that the  patients are doing what we've asked, it makes no sense to ask them to do more.   ? Acid (or non-acid) GERD > always difficult to exclude as up to 75% of pts in some series report no assoc GI/ Heartburn symptoms> rec max (24h)  acid suppression and diet restrictions/ reviewed and instructions given in writing.   ? Adverse effects of dpi > try off trelegy   ? Active sinus dz > finish levaquin  ? Allergy/asthma component > finish prednisone, continue singulair and change to ics hfa   ? Alpha one AT > screen at next ov with pfts   ? ACEi/ arb effects For reasons that may related to vascular permability and nitric oxide pathways but not elevated  bradykinin levels (as seen with  ACEi use) losartan  in the generic form has been reported now from mulitple sources  to cause a similar pattern of non-specific  upper airway symptoms as seen with acei.   This has not been reported with exposure to the other ARB's to date, so it seems reasonable for now to try either generic diovan or avapro if ARB needed or use an alternative class altogether.  See:  Dewayne Hatch Allergy Asthma Immunol  2008: 101: p [phone]  > since tachycardic try bystolic (see separate a/p)   ? Anxiety > usually at the bottom of this list of usual suspects but should  on this pt's based on H and P and note    may interfere with adherence and also interpretation of response or lack thereof to symptom management which can be quite subjective.   ? Active smoking > denies since 10/2016 NB:  When respiratory symptoms begin or become refractory well after a patient reports complete smoking cessation,  Especially when this wasn't the case while they were smoking, a red flag is raised based on the work of Dr Primitivo Gauze which states:  if you quit smoking when your best day FEV1 is still well preserved it is highly unlikely you will progress to severe disease.  That is to say, once the smoking stops,  the symptoms should not suddenly erupt or markedly worsen.  If so, the differential diagnosis should include  obesity/deconditioning,  LPR/Reflux/Aspiration syndromes,  occult CHF, or  especially side effect of medications commonly used  in this population.     ? Bronchiectasis or tracheomalacia related cough > try flutter valve   ? chf > no clinical evidence to suggest    Will return with pfts in 2 weeks  Discussed in detail all the  indications, usual  risks and alternatives  relative to the benefits with patient who agrees to proceed with w/u as outlined.     Total time devoted to counseling  > 50 % of initial 60 min office visit:  review case with pt/ discussion of options/alternatives/ personally creating written customized instructions  in  presence of pt  then going over those specific  Instructions directly with the pt including how to use all of the meds but in particular covering each new medication in detail and the difference between the maintenance= "automatic" meds and the prns using an action plan format for the latter (If this problem/symptom => do that organization reading Left to right).  Please see AVS from this visit for a full list of these instructions which I personally wrote for this pt and  are unique to this visit.

## 2017-10-25 NOTE — Progress Notes (Signed)
Subjective:     Patient ID: Eric Spencer, male   DOB: 26-Dec-1945    MRN: [id number]  HPI  53  yowm  Quit smoking 10/2016  asthma as child up until age 72  Played baseball/ wrestling in HS with sinus problems fall and spring in his 20's while in [place] better with cortisone sprays  starting age 72's and better after teeth pulled around 2009  Then 2010 moved back to [place] then next year developed seasonal cough/ sob better p abx/pred and prn saba and completely better in between upper resp symptoms  s any chronic rx and then around 2017 noted chronic doe/ worse with bending over > then more chronic cough/sob  since May 2018 despite stopping smoking and starting on trelegy and singulair and then much worse x early April 2019 while on maint rx with trelegy / singulair  so self referred to pulmonary clinic.    10/25/2017 1st [facility] office visit/ Tekia [place]   Chief Complaint  Patient presents with  . Pulmonary Consult    Referred by [facility].  Pt c/o SOB and cough off and on for the past several years- worse over the past 2-3 wks. He c/o SOB with walking short distances. His cough is non prod. He is using his albuterol inhaler 4 x daily and neb with albuterol 3 x daily on average.   since acute symptoms coughing /sob more so than usual  Whereas 3 weeks prior to OV  Had been maintained on trelegy and singulair and rarely needing saba in any form then acutely worse and no better p pred and levaquin this time   Having severe coughing fits with min Mucus is still yellow esp in am none from nose  /has two more days levaquin  Despite severe cough sob better and Now can go up to steps better than a week .prior to OV   but still way over using saba including in waiting room today and misunderstands how/ when to use saba   Already seen in ER with tachycardia 10/18/17 with MI ruled out   Does better at hs now on 2 pillows in terms of sob but severe hacking cough disrupts  sleep   No obvious day to day or daytime variability or assoc   mucus plugs or hemoptysis or cp or chest tightness, subjective wheeze or overt sinus or hb symptoms. No unusual exposure hx or h/o childhood pna/ asthma or knowledge of premature birth.   Also denies any obvious fluctuation of symptoms with weather or environmental changes or other aggravating or alleviating factors except as outlined above   Current Allergies, Complete Past Medical History, Past Surgical History, Family History, and Social History were reviewed in [employer] record.  ROS  The following are not active complaints unless bolded Hoarseness, sore throat, dysphagia, dental problems, itching, sneezing,  nasal congestion or discharge of excess mucus or purulent secretions, ear ache,   fever, chills, sweats, unintended wt loss or wt gain, classically pleuritic or exertional cp,  orthopnea pnd or arm/hand swelling  or leg swelling, presyncope, palpitations, abdominal pain, anorexia, nausea, vomiting, diarrhea  or change in bowel habits or change in bladder habits, change in stools or change in urine, dysuria, hematuria,  rash, arthralgias, visual complaints, headache, numbness, weakness or ataxia or problems with walking or coordination,  change in mood or  memory.        Current Meds  Medication Sig  . albuterol (PROVENTIL) (  2.5 MG/3ML) 0.083% nebulizer solution Take 3 mLs (2.5 mg total) by nebulization every 6 (six) hours as needed for wheezing or shortness of breath.  Marland Kitchen amLODipine (NORVASC) 10 MG tablet Take 1 tablet (10 mg total) by mouth daily.  Marland Kitchen dextromethorphan-guaiFENesin (MUCINEX DM) 30-600 MG 12hr tablet Take 1 tablet by mouth 2 (two) times daily.  . fluticasone (FLONASE) 50 MCG/ACT nasal spray Place 2 sprays into both nostrils daily.  Marland Kitchen levofloxacin (LEVAQUIN) 500 MG tablet Take 1 tablet (500 mg total) by mouth daily.  . montelukast (SINGULAIR) 10 MG tablet TAKE 1 TABLET BY MOUTH ONCE  DAILY.  Marland Kitchen predniSONE (DELTASONE) 10 MG tablet Take 1 tablet (10 mg total) by mouth daily with breakfast. 3 tablets for 4 days, 2 tablets for 4 days, 1 tablet for 4 days.  . VENTOLIN HFA 108 (90 Base) MCG/ACT inhaler INHALE 2 PUFFS INTO THE LUNGS EVERY 4 HOURS AS NEEDED.  Marland Kitchen   Fluticasone-Umeclidin-Vilant (TRELEGY ELLIPTA) 100-62.5-25 MCG/INH AEPB Inhale 1 Inhaler into the lungs daily.  .  losartan (COZAAR) 100 MG tablet TAKE ONE TABLET BY MOUTH ONCE DAILY.           Review of Systems     Objective:   Physical Exam  Anxious wm nad  Wt Readings from Last 3 Encounters:  10/25/17 204 lb (92.5 kg)  10/18/17 208 lb (94.3 kg)  10/18/17 208 lb (94.3 kg)     Vital signs reviewed - Note on arrival 02 sats  97% on RA and pulse 124 regular           HEENT: nl dentition / oropharynx. Nl external ear canals without cough reflex - moderate bilateral non-specific turbinate edema     NECK :  without JVD/Nodes/TM/ nl carotid upstrokes bilaterally   LUNGS: no acc muscle use,  Mod barrel  contour chest wall with bilateral  Distant bs s audible wheeze and  without cough on insp or exp maneuver and mod   Hyperresonant  to  percussion bilaterally     CV:  RRR  no s3 or murmur or increase in P2, and no edema   ABD:  soft and nontender with pos mid insp Hoover's  in the supine position. No bruits or organomegaly appreciated, bowel sounds nl  MS:   Nl gait/  ext warm without deformities, calf tenderness, cyanosis or clubbing No obvious joint restrictions   SKIN: warm and dry without lesions    NEURO:  alert, approp, nl sensorium with  no motor or cerebellar deficits apparent.       I personally reviewed images and agree with radiology impression as follows:  CXR:   10/25/17   COPD.  No active cardiopulmonary disease.   Labs ordered/ reviewed:      Chemistry      Component Value Date/Time   NA 136 10/18/2017 1347   K 4.6 10/18/2017 1347   CL 98 (L) 10/18/2017 1347   CO2 28  10/18/2017 1347   BUN 28 (H) 10/18/2017 1347   CREATININE 1.46 (H) 10/18/2017 1347   CREATININE 1.10 05/23/2017 1240      Component Value Date/Time   CALCIUM 8.8 (L) 10/18/2017 1347   ALKPHOS 58 01/13/2016 1544   AST 12 01/13/2016 1544   ALT 7 (L) 01/13/2016 1544   BILITOT 0.5 01/13/2016 1544        Lab Results  Component Value Date   WBC 14.4 (H) 10/18/2017   HGB 12.4 (L) 10/18/2017   HCT 38.8 (L) 10/18/2017  MCV 95.1 10/18/2017   PLT 216 10/18/2017         Lab Results  Component Value Date   TSH 1.70 01/13/2016        ekg 10/25/2017  ST at 108  No def ischemic change/ poor RWP        Assessment:

## 2017-10-26 ENCOUNTER — Other Ambulatory Visit: Payer: Self-pay | Admitting: Physician Assistant

## 2017-10-31 DIAGNOSIS — M9903 Segmental and somatic dysfunction of lumbar region: Secondary | ICD-10-CM | POA: Diagnosis not present

## 2017-10-31 DIAGNOSIS — M545 Low back pain: Secondary | ICD-10-CM | POA: Diagnosis not present

## 2017-11-05 ENCOUNTER — Telehealth: Payer: Self-pay | Admitting: Internal Medicine

## 2017-11-05 MED ORDER — NEBIVOLOL HCL 10 MG PO TABS: 10.0000 mg | ORAL_TABLET | Freq: Every day | ORAL | 2 refills | Status: DC

## 2017-11-05 NOTE — Telephone Encounter (Signed)
Pt requesting refill on bystolic.  This has been sent to preferred pharmacy.  Nothing further needed.

## 2017-11-07 DIAGNOSIS — M545 Low back pain: Secondary | ICD-10-CM | POA: Diagnosis not present

## 2017-11-07 DIAGNOSIS — M9903 Segmental and somatic dysfunction of lumbar region: Secondary | ICD-10-CM | POA: Diagnosis not present

## 2017-11-09 ENCOUNTER — Ambulatory Visit (INDEPENDENT_AMBULATORY_CARE_PROVIDER_SITE_OTHER): Payer: Medicare Other | Admitting: Internal Medicine

## 2017-11-09 ENCOUNTER — Encounter: Payer: Self-pay | Admitting: Internal Medicine

## 2017-11-09 VITALS — BP 140/80 | HR 87 | Ht 74.0 in | Wt 203.8 lb

## 2017-11-09 DIAGNOSIS — J441 Chronic obstructive pulmonary disease with (acute) exacerbation: Secondary | ICD-10-CM

## 2017-11-09 DIAGNOSIS — R059 Cough, unspecified: Secondary | ICD-10-CM

## 2017-11-09 DIAGNOSIS — R05 Cough: Secondary | ICD-10-CM | POA: Diagnosis not present

## 2017-11-09 DIAGNOSIS — I10 Essential (primary) hypertension: Secondary | ICD-10-CM

## 2017-11-09 MED ORDER — DOXYCYCLINE HYCLATE 100 MG PO TABS: 100.0000 mg | ORAL_TABLET | Freq: Two times a day (BID) | ORAL | 0 refills | Status: DC

## 2017-11-09 MED ORDER — BISOPROLOL FUMARATE 5 MG PO TABS: 5.0000 mg | ORAL_TABLET | Freq: Every day | ORAL | 11 refills | Status: AC

## 2017-11-09 MED ORDER — PREDNISONE 10 MG PO TABS: ORAL_TABLET | ORAL | 0 refills | Status: DC

## 2017-11-09 NOTE — Patient Instructions (Addendum)
When you finish the bystolic switch over to bisprolol 5 mg one tablet daily   Work on maintaining perferct  inhaler technique:  relax and gently blow all the way out then take a nice smooth deep breath back in, triggering the inhaler at same time you start breathing in.  Hold for up to 5 seconds if you can. Blow out thru nose. Rinse and gargle with water when done     Prednisone 10 mg take  4 each am x 2 days,   2 each am x 2 days,  1 each am x 2 days and stop   Doxycline 100 mg twice daily x 10 days   Please see patient coordinator before you leave today  to schedule  Sinus  CT    Please schedule a follow up office visit in 6 weeks, call sooner if needed - PFT's return  Add: did not go to lab/ can wait until next ov

## 2017-11-09 NOTE — Progress Notes (Signed)
Subjective:     Patient ID: Eric Spencer, male   DOB: 03-28-1946    MRN: [id number]    Brief patient profile:  [age]  yowm  Quit smoking 10/2016  asthma as child up until age 89  Played baseball/ wrestling in HS with sinus problems fall and spring in his 20's while in [place] better with cortisone sprays  starting age 72's and better after teeth pulled around 2009  Then 2010 moved back to [place] then next year developed seasonal cough/ sob better p abx/pred and prn saba and completely better in between upper resp symptoms  s any chronic rx and then around 2017 noted chronic doe/ worse with bending over > then more chronic cough/sob  since May 2018 despite stopping smoking and starting on trelegy and singulair and then much worse x early April 2019 while on maint rx with trelegy / singulair  so self referred to pulmonary clinic.      Brief patient profile:  10/25/2017 1st [name] office visit/ [name]   Chief Complaint  Patient presents with  . Pulmonary Consult    [facility]  Pt c/o SOB and cough off and on for the past several years- worse over the past 2-3 wks. He c/o SOB with walking short distances. His cough is non prod. He is using his albuterol inhaler 4 x daily and neb with albuterol 3 x daily on average.   since acute symptoms coughing /sob more so than usual  Whereas 3 weeks prior to OV  Had been maintained on trelegy and singulair and rarely needing saba in any form then acutely worse and no better p pred and levaquin this time  Having severe coughing fits with min Mucus is still yellow esp in am none from nose  /has two more days levaquin  Despite severe cough sob better and Now can go up to steps better than a week .prior to OV   but still way over using saba including in waiting room today and misunderstands how/ when to use saba  Already seen in ER with tachycardia 10/18/17 with MI ruled out  Does better at hs now on 2 pillows in terms  of sob but severe hacking cough disrupts sleep rec Plan A = Automatic =  symbicort 160 Take 2 puffs first thing in am and then another 2 puffs about 12 hours later.  Work on inhaler technique:   Plan B = Backup Only use your albuterol as a rescue medication Plan C = Crisis - only use your albuterol/ipatropium  nebulizer if you first try Plan B For cough >  mucinex dm 1200 mg every 12 hours and use the flutter valve as needed  Stop losartan and start bystolic 10 mg daily until return  Pantoprazole (protonix) 40 mg   Take  30-60 min before first meal of the day and Pepcid (famotidine)  20 mg one @  bedtime until return to office - this is the best way to tell whether stomach acid is contributing to your problem.   GERD  Diet .    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 Follow-up    Cough has SOB improved and then worsened again approx 1 wk after the last visit. He has not had to use nebs.   Dyspnea:  Was able to do  [employer] not now Cough: worst first thing in am /light green in am / says using flutter but still feeling urge to force a cough daytime  Sleep: no with breathing problems or noct disturbance due to cough  SABA use:  Has not used it much   No obvious day to day or daytime variability or assoc  mucus plugs or hemoptysis or cp or chest tightness, subjective wheeze or overt sinus or hb symptoms. No unusual exposure hx or h/o childhood pna  or knowledge of premature birth.  Sleeping  Ok   without nocturnal  or early am exacerbation  of respiratory  c/o's or need for noct saba. Also denies any obvious fluctuation of symptoms with weather or environmental changes or other aggravating or alleviating factors except as outlined above   Current Allergies, Complete Past Medical History, Past Surgical History, Family History, and Social History were reviewed in [employer] record.  ROS  The following are not active complaints unless bolded Hoarseness, sore throat, dysphagia, dental problems, itching, sneezing,  nasal congestion or discharge of excess mucus or purulent secretions, ear ache,   fever, chills, sweats, unintended wt loss or wt gain, classically pleuritic or exertional cp,  orthopnea pnd or arm/hand swelling  or leg swelling, presyncope, palpitations, abdominal pain, anorexia, nausea, vomiting, diarrhea  or change in bowel habits or change in bladder habits, change in stools or change in urine, dysuria, hematuria,  rash, arthralgias, visual complaints, headache, numbness, weakness or ataxia or problems with walking or coordination,  change in mood or  memory.        Current Meds  Medication Sig  . amLODipine (NORVASC) 10 MG tablet Take 1 tablet (10 mg total) by mouth daily.  Marland Kitchen aspirin EC 81 MG tablet Take 81 mg by mouth daily.  Marland Kitchen dextromethorphan-guaiFENesin (MUCINEX DM) 30-600 MG 12hr tablet Take 1 tablet by mouth 2 (two) times daily.  . famotidine (PEPCID) 20 MG tablet One at bedtime  . fluticasone (FLONASE) 50 MCG/ACT nasal spray Place 2 sprays into both nostrils daily.  Marland Kitchen ibuprofen (ADVIL,MOTRIN) 200 MG tablet Take 200 mg by mouth every 6 (six) hours as needed.  . montelukast (SINGULAIR) 10 MG tablet TAKE 1 TABLET BY MOUTH ONCE DAILY.  . nebivolol (BYSTOLIC) 10 MG tablet Take 1 tablet (10 mg total) by mouth daily.  . pantoprazole (PROTONIX) 40 MG tablet Take 1 tablet (40 mg total) by mouth daily. Take 30-60 min before first meal of the day  . Respiratory Therapy Supplies (FLUTTER) DEVI 1 Device by Does not apply route as needed.  . VENTOLIN HFA 108 (90 Base) MCG/ACT inhaler INHALE 2 PUFFS INTO THE LUNGS EVERY 4 HOURS AS NEEDED.              Objective:   Physical Exam  amb wm with forced coughing /throat clearing   11/09/2017       203   10/25/17 204 lb (92.5 kg)  10/18/17 208 lb (94.3 kg)  10/18/17 208 lb (94.3 kg)      Vital signs reviewed - Note on arrival 02 sats  93% on RA   And bp 140/80 P 87      HEENT: nl dentition / oropharynx. Nl external ear canals without cough reflex - moderate bilateral non-specific turbinate edema     NECK :  without JVD/Nodes/TM/ nl carotid upstrokes bilaterally   LUNGS: no acc muscle use,  Mod barrel  contour chest wall with bilateral  Coarse pan exp rhonchi  and  without cough on insp or exp maneuver and mod   Hyperresonant  to  percussion bilaterally     CV:  RRR  no s3 or murmur or increase in P2, and no edema   ABD:  soft and nontender with pos mid insp Hoover's  in the supine position. No bruits or organomegaly appreciated, bowel sounds nl  MS:   Nl gait/  ext warm without deformities, calf tenderness, cyanosis or clubbing No obvious joint restrictions   SKIN: warm and dry without lesions    NEURO:  alert, approp, nl sensorium with  no motor or cerebellar deficits apparent.              Labs  Reviewed 11/09/2017      Chemistry      Component Value Date/Time   NA 136 10/18/2017 1347   K 4.6 10/18/2017 1347   CL 98 (L) 10/18/2017 1347   CO2 28 10/18/2017 1347   BUN 28 (H) 10/18/2017 1347   CREATININE 1.46 (H) 10/18/2017 1347   CREATININE 1.10 05/23/2017 1240      Component Value Date/Time   CALCIUM 8.8 (L) 10/18/2017 1347   ALKPHOS 58 01/13/2016 1544   AST 12 01/13/2016 1544   ALT 7 (L) 01/13/2016 1544   BILITOT 0.5 01/13/2016 1544        Lab Results  Component Value Date   WBC 14.4 (H) 10/18/2017   HGB 12.4 (L) 10/18/2017   HCT 38.8 (L) 10/18/2017   MCV 95.1 10/18/2017   PLT 216 10/18/2017            Lab Results  Component Value Date   TSH 1.70 01/13/2016        ekg 10/25/2017  ST at 108  No def ischemic change/ poor RWP   Labs rec 11/09/2017 : did not go to lab as req for alpha one/ allergy testing         Assessment:

## 2017-11-10 ENCOUNTER — Encounter: Payer: Self-pay | Admitting: Internal Medicine

## 2017-11-10 DIAGNOSIS — R059 Cough, unspecified: Secondary | ICD-10-CM | POA: Insufficient documentation

## 2017-11-10 DIAGNOSIS — R05 Cough: Secondary | ICD-10-CM | POA: Insufficient documentation

## 2017-11-10 MED ORDER — BUDESONIDE-FORMOTEROL FUMARATE 160-4.5 MCG/ACT IN AERO: INHALATION_SPRAY | RESPIRATORY_TRACT | 12 refills | Status: DC

## 2017-11-10 NOTE — Assessment & Plan Note (Signed)
Changed arb to bystolic 10/25/2017 due to concerns re cough and tachycardia  Adequate control on present rx, reviewed in detail with pt > no change in rx needed  For now as pulse down to nl but cost of bystolic on his plan too high so next refill rec change to bisoprolol and Follow up per Primary Care planned

## 2017-11-10 NOTE — Assessment & Plan Note (Addendum)
10/25/2017    try symb 160 2bid and stop trelegy  -  10/25/2017  Added flutter valve   -  11/09/2017  After extensive coaching inhaler device  effectiveness =    90%   DDX of  difficult airways management almost all start with A and  include Adherence, Ace Inhibitors, Acid Reflux, Active Sinus Disease, Alpha 1 Antitripsin deficiency, Anxiety masquerading as Airways dz,  ABPA,  Allergy(esp in young), Aspiration (esp in elderly), Adverse effects of meds,  Active smokers, A bunch of PE's (a small clot burden can't cause this syndrome unless there is already severe underlying pulm or vascular dz with poor reserve) plus two Bs  = Bronchiectasis and Beta blocker use..and one C= CHF  Adherence is always the initial "prime suspect" and is a multilayered concern that requires a "trust but verify" approach in every patient - starting with knowing how to use medications, especially inhalers, correctly, keeping up with refills and understanding the fundamental difference between maintenance and prns vs those medications only taken for a very short course and then stopped and not refilled.  - see hfa teachng - return with all meds in hand using a trust but verify approach to confirm accurate Medication  Reconciliation The principal here is that until we are certain that the  patients are doing what we've asked, it makes no sense to ask them to do more.   ? Acid (or non-acid) GERD > always difficult to exclude as up to 75% of pts in some series report no assoc GI/ Heartburn symptoms> rec continue max (24h)  acid suppression and diet restrictions/ reviewed     ? Active sinus dz > check sinus ct/ doxy x 10 d as pen allergy ? How severe   ?allergy /asthma>  Continue singulair/symb 160 as maint and rx for acute flare with  Prednisone 10 mg take  4 each am x 2 days,   2 each am x 2 days,  1 each am x 2 days and stop and recheck allergy profile on return   ? Alpha one AT > check on return   ? Bronchiectasis > consider  HRCT Chest next and continue max mucinex and flutter in meantime   I had an extended discussion with the patient/wife  reviewing all relevant studies completed to date and  lasting 15 to 20 minutes of a 25 minute visit    See device teaching which extended face to face time for this visit   Each maintenance medication was reviewed in detail including most importantly the difference between maintenance and prns and under what circumstances the prns are to be triggered using an action plan format that is not reflected in the computer generated alphabetically organized AVS.    Please see AVS for specific instructions unique to this visit that I personally wrote and verbalized to the the pt in detail and then reviewed with pt  by my nurse highlighting any  changes in therapy recommended at today's visit to their plan of care.

## 2017-11-10 NOTE — Assessment & Plan Note (Signed)
Sinus ct ordered/ did not go to lab for allergy profile so do next ov

## 2017-11-12 ENCOUNTER — Telehealth: Payer: Self-pay | Admitting: *Deleted

## 2017-11-12 DIAGNOSIS — J441 Chronic obstructive pulmonary disease with (acute) exacerbation: Secondary | ICD-10-CM

## 2017-11-12 NOTE — Telephone Encounter (Signed)
ATC, NA and no option to leave msg 

## 2017-11-12 NOTE — Telephone Encounter (Signed)
-----   Message from Nyoka Cowden, MD sent at 11/10/2017  9:59 AM EDT ----- Make sure he understands symb 160 is 2 bid maint (called in more)

## 2017-11-13 DIAGNOSIS — M9903 Segmental and somatic dysfunction of lumbar region: Secondary | ICD-10-CM | POA: Diagnosis not present

## 2017-11-13 DIAGNOSIS — M545 Low back pain: Secondary | ICD-10-CM | POA: Diagnosis not present

## 2017-11-15 MED ORDER — PREDNISONE 10 MG PO TABS: ORAL_TABLET | ORAL | 0 refills | Status: DC

## 2017-11-15 NOTE — Telephone Encounter (Signed)
Spoke with the pt and notified of recs per MW  He verbalized understanding  He stated that his breathing has improved some, but not back to his normal baseline yet  He asked for another pred taper  Per MW- okay to refill x 1 only and if not improving will need ov with all meds in hand  Pt aware

## 2017-11-26 ENCOUNTER — Other Ambulatory Visit: Payer: Self-pay | Admitting: Physician Assistant

## 2017-11-26 DIAGNOSIS — M545 Low back pain: Secondary | ICD-10-CM | POA: Diagnosis not present

## 2017-11-26 DIAGNOSIS — M9903 Segmental and somatic dysfunction of lumbar region: Secondary | ICD-10-CM | POA: Diagnosis not present

## 2017-11-28 ENCOUNTER — Ambulatory Visit (HOSPITAL_COMMUNITY)
Admission: RE | Admit: 2017-11-28 | Discharge: 2017-11-28 | Disposition: A | Discharge status: Home / Self Care | Payer: Medicare Other | Source: Ambulatory Visit | Attending: Internal Medicine | Admitting: Internal Medicine

## 2017-11-28 DIAGNOSIS — R0602 Shortness of breath: Secondary | ICD-10-CM | POA: Diagnosis not present

## 2017-11-28 DIAGNOSIS — J441 Chronic obstructive pulmonary disease with (acute) exacerbation: Secondary | ICD-10-CM | POA: Diagnosis not present

## 2017-11-28 DIAGNOSIS — R059 Cough, unspecified: Secondary | ICD-10-CM

## 2017-11-28 DIAGNOSIS — R05 Cough: Secondary | ICD-10-CM | POA: Diagnosis not present

## 2017-11-29 ENCOUNTER — Other Ambulatory Visit: Payer: Self-pay | Admitting: Family Medicine

## 2017-11-29 DIAGNOSIS — I10 Essential (primary) hypertension: Secondary | ICD-10-CM

## 2017-11-29 NOTE — Progress Notes (Signed)
Spoke with pt and notified of results per Dr. [name]. Pt verbalized understanding and denied any questions. 

## 2017-12-06 ENCOUNTER — Telehealth: Payer: Self-pay | Admitting: Internal Medicine

## 2017-12-06 DIAGNOSIS — J439 Emphysema, unspecified: Secondary | ICD-10-CM

## 2017-12-06 MED ORDER — AZITHROMYCIN 250 MG PO TABS: ORAL_TABLET | ORAL | 0 refills | Status: DC

## 2017-12-06 MED ORDER — VENTOLIN HFA 108 (90 BASE) MCG/ACT IN AERS: 1.0000 | INHALATION_SPRAY | RESPIRATORY_TRACT | 11 refills | Status: DC | PRN

## 2017-12-06 MED ORDER — PREDNISONE 20 MG PO TABS: 20.0000 mg | ORAL_TABLET | Freq: Every day | ORAL | 0 refills | Status: DC

## 2017-12-06 NOTE — Telephone Encounter (Signed)
Called pt and advised message from the provider. Pt understood and verbalized understanding. Nothing further is needed.   Sent in Rx for Prednisone, Ventolin, and Zpak to [facility].

## 2017-12-06 NOTE — Telephone Encounter (Signed)
Offer prednisone 20 mg, # 5, 1 daily           Zpak   250 mg, # 6, 2 today then one daily            Ok to refill his ventolin inhaler x 1 year

## 2017-12-06 NOTE — Telephone Encounter (Signed)
Patient called stating that he has been using his Ventolin inhaler more in the last week.  He has shortness of breath with activity and non productive cough starting a week ago.  He states that he is taking Mucinex twice a day and drinking plenty of fluids.  He stated that he had this problem last year and was given Prednisone and a antibiotic. Needs refill for ventolin inhaler.  Uses [facility] pharmacy.  MW Patient LOV 11/09/17 with MW  CY please advise  Allergies  Allergen Reactions  . Penicillins    Current Outpatient Medications on File Prior to Visit  Medication Sig Dispense Refill  . albuterol (PROVENTIL) (2.5 MG/3ML) 0.083% nebulizer solution Take 3 mLs (2.5 mg total) by nebulization every 6 (six) hours as needed for wheezing or shortness of breath. 150 mL 1  . amLODipine (NORVASC) 10 MG tablet TAKE 1 TABLET BY MOUTH ONCE DAILY. 90 tablet 0  . aspirin EC 81 MG tablet Take 81 mg by mouth daily.    . bisoprolol (ZEBETA) 5 MG tablet Take 1 tablet (5 mg total) by mouth daily. 30 tablet 11  . budesonide-formoterol (SYMBICORT) 160-4.5 MCG/ACT inhaler Take 2 puffs first thing in am and then another 2 puffs about 12 hours later. 1 Inhaler 12  . dextromethorphan-guaiFENesin (MUCINEX DM) 30-600 MG 12hr tablet Take 1 tablet by mouth 2 (two) times daily.    Marland Kitchen. doxycycline (VIBRA-TABS) 100 MG tablet Take 1 tablet (100 mg total) by mouth 2 (two) times daily. 20 tablet 0  . famotidine (PEPCID) 20 MG tablet One at bedtime 30 tablet 11  . fluticasone (FLONASE) 50 MCG/ACT nasal spray Place 2 sprays into both nostrils daily. 16 g 6  . ibuprofen (ADVIL,MOTRIN) 200 MG tablet Take 200 mg by mouth every 6 (six) hours as needed.    Marland Kitchen. ipratropium (ATROVENT) 0.02 % nebulizer solution Take 2.5 mLs (0.5 mg total) by nebulization every 4 (four) hours as needed for wheezing or shortness of breath. 75 mL 12  . montelukast (SINGULAIR) 10 MG tablet TAKE 1 TABLET BY MOUTH ONCE DAILY. 30 tablet 0  . nebivolol  (BYSTOLIC) 10 MG tablet Take 1 tablet (10 mg total) by mouth daily. 30 tablet 2  . pantoprazole (PROTONIX) 40 MG tablet Take 1 tablet (40 mg total) by mouth daily. Take 30-60 min before first meal of the day 30 tablet 2  . predniSONE (DELTASONE) 10 MG tablet Take  4 each am x 2 days,   2 each am x 2 days,  1 each am x 2 days and stop 14 tablet 0  . Respiratory Therapy Supplies (FLUTTER) DEVI 1 Device by Does not apply route as needed. 1 each 0  . VENTOLIN HFA 108 (90 Base) MCG/ACT inhaler INHALE 2 PUFFS INTO THE LUNGS EVERY 4 HOURS AS NEEDED. 18 g 0   No current facility-administered medications on file prior to visit.

## 2017-12-10 ENCOUNTER — Telehealth: Payer: Self-pay | Admitting: Internal Medicine

## 2017-12-10 DIAGNOSIS — M9903 Segmental and somatic dysfunction of lumbar region: Secondary | ICD-10-CM | POA: Diagnosis not present

## 2017-12-10 DIAGNOSIS — M545 Low back pain: Secondary | ICD-10-CM | POA: Diagnosis not present

## 2017-12-10 NOTE — Telephone Encounter (Signed)
Attempted to contact pt. I did not receive an answer. There was no option for me to leave a message. Will try back.  

## 2017-12-11 NOTE — Telephone Encounter (Signed)
Instruction was to change bystolic to bisoprolol 5 mg daily so ok to change to it and if not strong enough can take it bid with f/u by PCP needed to fine tune it but call us in meantime if any issues with it

## 2017-12-11 NOTE — Telephone Encounter (Signed)
Called and spoke with pt who states MW was going to change pt's BP med from Bystolic to bisoprolol due to pt's other med being too expensive. Pt states he has received the bisoprolol.  Pt states he checked BP and it was low on  Sunday, 12/09/17 but he wertstates his machine was bad and BP was about 20 points off from what his BP was with other machine.  Pt wants to know if he should start taking his new BP med of bisoprolol now. Pt states his BP was checked by chiropractor yesterday, 6/17 and was 138/76 and BP has been running 140's/70's.  Dr. Sherene SiresWert, please advise on this for pt. Thanks!

## 2017-12-11 NOTE — Telephone Encounter (Signed)
Attempted to contact pt. I did not receive an answer. There was no option for me to leave a message. Will try back.  

## 2017-12-12 NOTE — Telephone Encounter (Signed)
Spoke with pt,aware of recs.  Nothing further needed.  

## 2017-12-20 ENCOUNTER — Other Ambulatory Visit: Payer: Self-pay | Admitting: Internal Medicine

## 2017-12-20 DIAGNOSIS — J439 Emphysema, unspecified: Secondary | ICD-10-CM

## 2017-12-21 ENCOUNTER — Encounter: Payer: Self-pay | Admitting: Internal Medicine

## 2017-12-21 ENCOUNTER — Ambulatory Visit (INDEPENDENT_AMBULATORY_CARE_PROVIDER_SITE_OTHER): Payer: Medicare Other | Admitting: Internal Medicine

## 2017-12-21 ENCOUNTER — Other Ambulatory Visit (INDEPENDENT_AMBULATORY_CARE_PROVIDER_SITE_OTHER): Payer: Medicare Other

## 2017-12-21 VITALS — BP 122/78 | HR 79 | Ht 72.0 in | Wt 206.0 lb

## 2017-12-21 DIAGNOSIS — R05 Cough: Secondary | ICD-10-CM

## 2017-12-21 DIAGNOSIS — J441 Chronic obstructive pulmonary disease with (acute) exacerbation: Secondary | ICD-10-CM | POA: Diagnosis not present

## 2017-12-21 DIAGNOSIS — J439 Emphysema, unspecified: Secondary | ICD-10-CM

## 2017-12-21 DIAGNOSIS — R059 Cough, unspecified: Secondary | ICD-10-CM

## 2017-12-21 DIAGNOSIS — J449 Chronic obstructive pulmonary disease, unspecified: Secondary | ICD-10-CM

## 2017-12-21 LAB — PULMONARY FUNCTION TEST
DL/VA % pred: 41 %
DL/VA: 1.95 ml/min/mmHg/L
DLCO UNC % PRED: 37 %
DLCO unc: 13.09 ml/min/mmHg
FEF 25-75 POST: 0.52 L/s
FEF 25-75 PRE: 0.41 L/s
FEF2575-%CHANGE-POST: 27 %
FEF2575-%PRED-PRE: 15 %
FEF2575-%Pred-Post: 19 %
FEV1-%Change-Post: 15 %
FEV1-%PRED-POST: 29 %
FEV1-%Pred-Pre: 25 %
FEV1-PRE: 0.9 L
FEV1-Post: 1.04 L
FEV1FVC-%Change-Post: 7 %
FEV1FVC-%PRED-PRE: 48 %
FEV6-%Change-Post: 8 %
FEV6-%PRED-POST: 57 %
FEV6-%Pred-Pre: 52 %
FEV6-Post: 2.57 L
FEV6-Pre: 2.36 L
FEV6FVC-%Change-Post: 1 %
FEV6FVC-%Pred-Post: 101 %
FEV6FVC-%Pred-Pre: 99 %
FVC-%CHANGE-POST: 7 %
FVC-%Pred-Post: 56 %
FVC-%Pred-Pre: 52 %
FVC-POST: 2.67 L
FVC-Pre: 2.48 L
POST FEV6/FVC RATIO: 96 %
PRE FEV1/FVC RATIO: 36 %
Post FEV1/FVC ratio: 39 %
Pre FEV6/FVC Ratio: 95 %
RV % pred: 211 %
RV: 5.42 L
TLC % PRED: 117 %
TLC: 8.76 L

## 2017-12-21 LAB — CBC WITH DIFFERENTIAL/PLATELET
BASOS PCT: 0.4 % (ref 0.0–3.0)
Basophils Absolute: 0 10*3/uL (ref 0.0–0.1)
EOS ABS: 0 10*3/uL (ref 0.0–0.7)
Eosinophils Relative: 0.5 % (ref 0.0–5.0)
HCT: 38.3 % — ABNORMAL LOW (ref 39.0–52.0)
HEMOGLOBIN: 12.7 g/dL — AB (ref 13.0–17.0)
Lymphocytes Relative: 15.8 % (ref 12.0–46.0)
Lymphs Abs: 1.5 10*3/uL (ref 0.7–4.0)
MCHC: 33.2 g/dL (ref 30.0–36.0)
MCV: 91.4 fl (ref 78.0–100.0)
MONO ABS: 0.5 10*3/uL (ref 0.1–1.0)
Monocytes Relative: 4.9 % (ref 3.0–12.0)
NEUTROS PCT: 78.4 % — AB (ref 43.0–77.0)
Neutro Abs: 7.5 10*3/uL (ref 1.4–7.7)
Platelets: 234 10*3/uL (ref 150.0–400.0)
RBC: 4.19 Mil/uL — ABNORMAL LOW (ref 4.22–5.81)
RDW: 16.2 % — AB (ref 11.5–15.5)
WBC: 9.5 10*3/uL (ref 4.0–10.5)

## 2017-12-21 MED ORDER — TIOTROPIUM BROMIDE MONOHYDRATE 2.5 MCG/ACT IN AERS: 2.0000 | INHALATION_SPRAY | Freq: Every day | RESPIRATORY_TRACT | 0 refills | Status: DC

## 2017-12-21 MED ORDER — TIOTROPIUM BROMIDE MONOHYDRATE 2.5 MCG/ACT IN AERS: 2.0000 | INHALATION_SPRAY | Freq: Every day | RESPIRATORY_TRACT | 11 refills | Status: DC

## 2017-12-21 MED ORDER — PREDNISONE 10 MG PO TABS: ORAL_TABLET | ORAL | 1 refills | Status: DC

## 2017-12-21 NOTE — Progress Notes (Signed)
PFT done today. 

## 2017-12-21 NOTE — Progress Notes (Signed)
Subjective:     Patient ID: Eric Spencer, male   DOB: 03-28-1946    MRN: [id number]    Brief patient profile:  [age]  yowm  Quit smoking 10/2016  asthma as child up until age 89  Played baseball/ wrestling in HS with sinus problems fall and spring in his 20's while in [place] better with cortisone sprays  starting age 72's and better after teeth pulled around 2009  Then 2010 moved back to [place] then next year developed seasonal cough/ sob better p abx/pred and prn saba and completely better in between upper resp symptoms  s any chronic rx and then around 2017 noted chronic doe/ worse with bending over > then more chronic cough/sob  since May 2018 despite stopping smoking and starting on trelegy and singulair and then much worse x early April 2019 while on maint rx with trelegy / singulair  so self referred to pulmonary clinic.      Brief patient profile:  10/25/2017 1st [name] office visit/ Eric Spencer   Chief Complaint  Patient presents with  . Pulmonary Consult    Referred by Eric Spencer.  Pt c/o SOB and cough off and on for the past several years- worse over the past 2-3 wks. He c/o SOB with walking short distances. His cough is non prod. He is using his albuterol inhaler 4 x daily and neb with albuterol 3 x daily on average.   since acute symptoms coughing /sob more so than usual  Whereas 3 weeks prior to OV  Had been maintained on trelegy and singulair and rarely needing saba in any form then acutely worse and no better p pred and levaquin this time  Having severe coughing fits with min Mucus is still yellow esp in am none from nose  /has two more days levaquin  Despite severe cough sob better and Now can go up to steps better than a week .prior to OV   but still way over using saba including in waiting room today and misunderstands how/ when to use saba  Already seen in ER with tachycardia 10/18/17 with MI ruled out  Does better at hs now on 2 pillows in terms  of sob but severe hacking cough disrupts sleep rec Plan A = Automatic =  symbicort 160 Take 2 puffs first thing in am and then another 2 puffs about 12 hours later.  Work on inhaler technique:   Plan B = Backup Only use your albuterol as a rescue medication Plan C = Crisis - only use your albuterol/ipatropium  nebulizer if you first try Plan B For cough >  mucinex dm 1200 mg every 12 hours and use the flutter valve as needed  Stop losartan and start bystolic 10 mg daily until return  Pantoprazole (protonix) 40 mg   Take  30-60 min before first meal of the day and Pepcid (famotidine)  20 mg one @  bedtime until return to office - this is the best way to tell whether stomach acid is contributing to your problem.   GERD  Diet .    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 Follow-up    Cough has SOB improved and then worsened again approx 1 wk after the last visit. He has not had to use nebs.   Dyspnea:  Was able to do  [employer] not now Cough: worst first thing in am /light green in am / says using flutter but still feeling urge to force a cough daytime  Sleep: no with breathing problems or noct disturbance due to cough  SABA use:  Has not used it much  rec When you finish the bystolic switch over to bisprolol 5 mg one tablet daily  Work on maintaining perferct  inhaler technique: Prednisone 10 mg take  4 each am x 2 days,   2 each am x 2 days,  1 each am x 2 days and stop  Doxycline 100 mg twice daily x 10 days  schedule  Sinus  CT  neg Add: did not go to lab/ can wait until next ov     12/21/2017  f/u ov/Eric Spencer re:  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 Follow-up    PFT's done today. Pt states breathing seems worse since the last visit. He is using his albuterol inhaler 2 x daily on average and rarely uses neb.   Dyspnea:  Worse since April  2019  Cough:  Worse since mowed grass 12/18/17   SABA WUJ:WJXBJYNuse:reports quite a bit better p saba despite symb adherence at 2 puffs q 12 / always better with pred also 02: none    No obvious day to day or daytime variability or assoc excess/ purulent sputum or mucus plugs or hemoptysis or cp or chest tightness, subjective wheeze or overt sinus or hb symptoms.   Sleeping: flat two pillows without nocturnal  or early am exacerbation  of respiratory  c/o's or need for noct saba. Also denies any obvious fluctuation of symptoms with weather or environmental changes or other aggravating or alleviating factors except as outlined above   No unusual exposure hx or h/o childhood pna/ asthma or knowledge of premature birth.  Current Allergies, Complete Past Medical History, Past Surgical History, Family History, and Social History were reviewed in Eric Spencer electronic medical record.  ROS  The following are not active complaints unless bolded Hoarseness, sore throat, dysphagia, dental problems, itching, sneezing,  nasal congestion or discharge of excess mucus or purulent secretions, ear ache,   fever, chills, sweats, unintended wt loss or wt gain, classically pleuritic or exertional cp,  orthopnea pnd or arm/hand swelling  or leg swelling intermittent , presyncope, palpitations, abdominal pain, anorexia, nausea, vomiting, diarrhea  or change in bowel habits or change in bladder habits, change in stools or change in urine, dysuria, hematuria,  rash, arthralgias, visual complaints, headache, numbness, weakness or ataxia or problems with walking or coordination,  change in mood or  memory.        Current Meds  Medication Sig  . albuterol (PROVENTIL) (2.5 MG/3ML) 0.083% nebulizer solution Take 3 mLs (2.5 mg total) by nebulization every 6 (six) hours as needed for wheezing or shortness of breath.  Marland Kitchen. amLODipine (NORVASC) 10 MG tablet TAKE 1 TABLET BY MOUTH ONCE DAILY.  Marland Kitchen. aspirin EC 81 MG tablet Take 81 mg by  mouth daily.  . bisoprolol (ZEBETA) 5 MG tablet Take 1 tablet (5 mg total) by mouth daily.  . budesonide-formoterol (SYMBICORT) 160-4.5 MCG/ACT inhaler Take 2 puffs first thing in am and then another 2 puffs about 12 hours later.  Marland Kitchen. dextromethorphan-guaiFENesin (MUCINEX DM) 30-600 MG 12hr tablet Take 1 tablet by mouth 2 (two) times daily.  . famotidine (PEPCID) 20 MG tablet One at bedtime  . fluticasone (FLONASE) 50 MCG/ACT nasal spray Place  2 sprays into both nostrils daily.  Marland Kitchen ibuprofen (ADVIL,MOTRIN) 200 MG tablet Take 200 mg by mouth every 6 (six) hours as needed.  Marland Kitchen ipratropium (ATROVENT) 0.02 % nebulizer solution Take 2.5 mLs (0.5 mg total) by nebulization every 4 (four) hours as needed for wheezing or shortness of breath.  . montelukast (SINGULAIR) 10 MG tablet TAKE 1 TABLET BY MOUTH ONCE DAILY.  . pantoprazole (PROTONIX) 40 MG tablet Take 1 tablet (40 mg total) by mouth daily. Take 30-60 min before first meal of the day  . Respiratory Therapy Supplies (FLUTTER) DEVI 1 Device by Does not apply route as needed.  . VENTOLIN HFA 108 (90 Base) MCG/ACT inhaler Inhale 1-2 puffs into the lungs every 4 (four) hours as needed for wheezing or shortness of breath.              Objective:   Physical Exam  amb wm nad    12/21/2017       206  11/09/2017       203   10/25/17 204 lb (92.5 kg)  10/18/17 208 lb (94.3 kg)  10/18/17 208 lb (94.3 kg)     Vital signs reviewed - Note on arrival 02 sats  97% on RA  And BP 122/78     HEENT: nl dentition / oropharynx. Nl external ear canals without cough reflex - moderate bilateral non-specific turbinate edema     NECK :  without JVD/Nodes/TM/ nl carotid upstrokes bilaterally   LUNGS: no acc muscle use,  Mod barrel  contour chest wall with bilateral  Distant bs s audible wheeze and  without cough on insp or exp maneuver and mod   Hyperresonant  to  percussion bilaterally     CV:  RRR  no s3 or murmur or increase in P2, and no edema   ABD:   soft and nontender with pos mid insp Hoover's  in the supine position. No bruits or organomegaly appreciated, bowel sounds nl  MS:   Nl gait/  ext warm without deformities, calf tenderness, cyanosis or clubbing No obvious joint restrictions   SKIN: warm and dry without lesions    NEURO:  alert, approp, nl sensorium with  no motor or cerebellar deficits apparent.         Labs ordered 12/21/2017   alpha one/ allergy testing         Assessment:

## 2017-12-21 NOTE — Patient Instructions (Addendum)
Prednisone 10 mg  X 2 each am until better then one daily until return   Plan A = Automatic = symbicort 160x 2 and spiriva 2 pffs each am / symbicort 160 x 2   Work on inhaler technique:  relax and gently blow all the way out then take a nice smooth deep breath back in, triggering the inhaler at same time you start breathing in.  Hold for up to 5 seconds if you can. Blow out thru nose. Rinse and gargle with water when done      Plan B = Backup Only use your albuterol as a rescue medication to be used if you can't catch your breath by resting or doing a relaxed purse lip breathing pattern.  - The less you use it, the better it will work when you need it. - Ok to use the inhaler up to 2 puffs  every 4 hours if you must but call for appointment if use goes up over your usual need - Don't leave home without it !!  (think of it like the spare tire for your car)   Plan C = Crisis - only use your albuterol nebulizer if you first try Plan B and it fails to help > ok to use the nebulizer up to every 4 hours but if start needing it regularly call for immediate appointment    Please remember to go to the lab department downstairs in the basement  for your tests - we will call you with the results when they are available.   Please schedule a follow up office visit in 4 weeks, sooner if needed  with all medications /inhalers/ solutions in hand so we can verify exactly what you are taking. This includes all medications from all doctors and over the counters

## 2017-12-23 ENCOUNTER — Encounter: Payer: Self-pay | Admitting: Internal Medicine

## 2017-12-23 NOTE — Assessment & Plan Note (Signed)
PFT's  12/21/2017  FEV1 1.04 (29 % ) ratio 39  p 15 % improvement from saba p symbicort 160 x 2  prior to study with DLCO  37 % corrects to 41 % for alv volume   - 12/21/2017  After extensive coaching inhaler device  effectiveness =    75% SMI > start spiriva smi 2.5 x 2 qam - Prednisone maint rx 12/21/2017   Group D in terms of symptom/risk and laba/lama/ICS  therefore appropriate rx at this point so will add lama  Also since so severe and reports improvement on prednisone will try intermediate course (x 4 weeks then re -eval). The goal with a chronic steroid dependent illness is always arriving at the lowest effective dose that controls the disease/symptoms and not accepting a set "formula" which is based on statistics or guidelines that don't always take into account patient  variability or the natural hx of the dz in every individual patient, which may well vary over time.  For now therefore I recommend the patient maintain  Ceiling of 20 mg and floor of 10 mg daily.   I had an extended discussion with the patient reviewing all relevant studies completed to date and  lasting 15 to 20 minutes of a 25 minute visit    See device teaching which extended face to face time for this visit.  Each maintenance medication was reviewed in detail including emphasizing most importantly the difference between maintenance and prns and under what circumstances the prns are to be triggered using an action plan format that is not reflected in the computer generated alphabetically organized AVS which I have not found useful in most complex patients, especially with respiratory illnesses  Please see AVS for specific instructions unique to this visit that I personally wrote and verbalized to the the pt in detail and then reviewed with pt  by my nurse highlighting any  changes in therapy recommended at today's visit to their plan of care.

## 2017-12-23 NOTE — Assessment & Plan Note (Signed)
Sinus ct 11/28/2017 >>> Clear paranasal sinuses Allergy profile 12/21/2017 >  Eos 0.0 /  IgE pending   Worse since mowed grass so allergy panel sent Continue flonase/ singulair/flutter valve for now - added pred maint today (see copd a/p)

## 2017-12-24 ENCOUNTER — Other Ambulatory Visit: Payer: Self-pay | Admitting: Internal Medicine

## 2017-12-25 ENCOUNTER — Other Ambulatory Visit: Payer: Self-pay | Admitting: Internal Medicine

## 2017-12-25 LAB — RESPIRATORY ALLERGY PROFILE REGION II ~~LOC~~
ALLERGEN, CEDAR TREE, T6: 0.23 kU/L — AB
ALLERGEN, MULBERRY, T70: 0.29 kU/L — AB
ALLERGEN, OAK, T7: 0.1 kU/L — AB
ASPERGILLUS FUMIGATUS M3: 0.42 kU/L — AB
Allergen, A. alternata, m6: <0.1 kU/L
Allergen, Comm Silver Birch, t9: 0.4 kU/L — ABNORMAL HIGH
Allergen, Cottonwood, t14: 0.14 kU/L — ABNORMAL HIGH
Allergen, D pternoyssinus,d7: 1.86 kU/L — ABNORMAL HIGH
Allergen, Mouse Urine Protein, e78: <0.1 kU/L
Allergen, P. notatum, m1: <0.1 kU/L
BERMUDA GRASS: 0.86 kU/L — AB
Box Elder IgE: 0.19 kU/L — ABNORMAL HIGH
CLADOSPORIUM HERBARUM (M2) IGE: <0.1 kU/L
CLASS: 0
CLASS: 0
CLASS: 0
CLASS: 0
CLASS: 0
CLASS: 0
CLASS: 0
CLASS: 0
CLASS: 0
CLASS: 1
CLASS: 2
CLASS: 2
CLASS: 2
CLASS: 3
COCKROACH: 0.13 kU/L — AB
COMMON RAGWEED (SHORT) (W1) IGE: 0.36 kU/L — ABNORMAL HIGH
Class: 0
Class: 0
Class: 0
Class: 1
Class: 1
Class: 1
Class: 2
Class: 2
Class: 2
Class: 2
D. farinae: 1.6 kU/L — ABNORMAL HIGH
Dog Dander: 0.12 kU/L — ABNORMAL HIGH
Elm IgE: 1.65 kU/L — ABNORMAL HIGH
IgE (Immunoglobulin E), Serum: 540 kU/L — ABNORMAL HIGH (ref <=114)
Johnson Grass: 1.42 kU/L — ABNORMAL HIGH
Pecan/Hickory Tree IgE: 1.39 kU/L — ABNORMAL HIGH
ROUGH PIGWEED IGE: 0.76 kU/L — AB
Sheep Sorrel IgE: 0.46 kU/L — ABNORMAL HIGH
TIMOTHY GRASS: 3.54 kU/L — AB

## 2017-12-25 LAB — ALPHA-1 ANTITRYPSIN PHENOTYPE: A-1 Antitrypsin, Ser: 191 mg/dL (ref 83–199)

## 2017-12-25 LAB — INTERPRETATION:

## 2017-12-25 MED ORDER — MONTELUKAST SODIUM 10 MG PO TABS: 10.0000 mg | ORAL_TABLET | Freq: Every day | ORAL | 0 refills | Status: DC

## 2017-12-25 NOTE — Progress Notes (Signed)
Spoke with pt and notified of results per Dr. [name]. Pt verbalized understanding and denied any questions. 

## 2017-12-31 DIAGNOSIS — M545 Low back pain: Secondary | ICD-10-CM | POA: Diagnosis not present

## 2017-12-31 DIAGNOSIS — M9903 Segmental and somatic dysfunction of lumbar region: Secondary | ICD-10-CM | POA: Diagnosis not present

## 2018-01-16 ENCOUNTER — Telehealth: Payer: Self-pay | Admitting: Internal Medicine

## 2018-01-16 MED ORDER — AZITHROMYCIN 250 MG PO TABS: ORAL_TABLET | ORAL | 0 refills | Status: DC

## 2018-01-16 NOTE — Telephone Encounter (Signed)
ATC pt - No answer - unable to leave message - Will call back [facility] Will also need to instruct pt on Pred dosage

## 2018-01-16 NOTE — Telephone Encounter (Signed)
Spoke with the pt. He is c/o increased cough with thick, yellow sputum x 3 days.  His breathing is about the same since last visit. No wheezing, CP or chest tightness. He is taking mucinex 1200 mg every 12 hours with no relief. Still taking just 10 mg pred  He states has ov on 01/22/18 and refused anything sooner b/c he does not live here in [place]  Please advise thanks Last ov AVS:  Prednisone 10 mg  X 2 each am until better then one daily until return    Plan A = Automatic = symbicort 160x 2 and spiriva 2 pffs each am / symbicort 160 x 2    Work on inhaler technique:  relax and gently blow all the way out then take a nice smooth deep breath back in, triggering the inhaler at same time you start breathing in.  Hold for up to 5 seconds if you can. Blow out thru nose. Rinse and gargle with water when done        Plan B = Backup Only use your albuterol as a rescue medication to be used if you can't catch your breath by resting or doing a relaxed purse lip breathing pattern.  - The less you use it, the better it will work when you need it. - Ok to use the inhaler up to 2 puffs  every 4 hours if you must but call for appointment if use goes up over your usual need - Don't leave home without it !!  (think of it like the spare tire for your car)    Plan C = Crisis - only use your albuterol nebulizer if you first try Plan B and it fails to help > ok to use the nebulizer up to every 4 hours but if start needing it regularly call for immediate appointment      Please remember to go to the lab department downstairs in the basement  for your tests - we will call you with the results when they are available.     Please schedule a follow up office visit in 4 weeks, sooner if needed  with all medications /inhalers/ solutions in hand so we can verify exactly what you are taking. This includes all medications from all doctors and over the counters

## 2018-01-16 NOTE — Telephone Encounter (Signed)
Spoke with the pt and notified zpack was sent and advised him on pred directions  He verbalized understanding  Nothing further needed

## 2018-01-16 NOTE — Telephone Encounter (Signed)
zpak  Increase pred to 10 mg x 2 daily until better then back to 10 mg daily

## 2018-01-16 NOTE — Addendum Note (Signed)
Addended by: Abigail MiyamotoPHELPS, DENISE D on: 01/16/2018 02:10 PM   Modules accepted: Orders

## 2018-01-22 ENCOUNTER — Other Ambulatory Visit: Payer: Self-pay | Admitting: Internal Medicine

## 2018-01-22 ENCOUNTER — Encounter: Payer: Self-pay | Admitting: Internal Medicine

## 2018-01-22 ENCOUNTER — Ambulatory Visit (INDEPENDENT_AMBULATORY_CARE_PROVIDER_SITE_OTHER): Payer: Medicare Other | Admitting: Internal Medicine

## 2018-01-22 VITALS — BP 160/90 | HR 99 | Ht 72.0 in | Wt 217.0 lb

## 2018-01-22 DIAGNOSIS — R05 Cough: Secondary | ICD-10-CM | POA: Diagnosis not present

## 2018-01-22 DIAGNOSIS — R059 Cough, unspecified: Secondary | ICD-10-CM

## 2018-01-22 DIAGNOSIS — J449 Chronic obstructive pulmonary disease, unspecified: Secondary | ICD-10-CM

## 2018-01-22 DIAGNOSIS — I10 Essential (primary) hypertension: Secondary | ICD-10-CM | POA: Diagnosis not present

## 2018-01-22 MED ORDER — ROFLUMILAST 500 MCG PO TABS: 500.0000 ug | ORAL_TABLET | Freq: Every day | ORAL | 11 refills | Status: DC

## 2018-01-22 NOTE — Progress Notes (Signed)
Subjective:     Patient ID: Eric Spencer, male   DOB: 03-28-1946    MRN: [id number]    Brief patient profile:  [age]  yowm  Quit smoking 10/2016  asthma as child up until age 89  Played baseball/ wrestling in HS with sinus problems fall and spring in his 20's while in [place] better with cortisone sprays  starting age 72's and better after teeth pulled around 2009  Then 2010 moved back to [place] then next year developed seasonal cough/ sob better p abx/pred and prn saba and completely better in between upper resp symptoms  s any chronic rx and then around 2017 noted chronic doe/ worse with bending over > then more chronic cough/sob  since May 2018 despite stopping smoking and starting on trelegy and singulair and then much worse x early April 2019 while on maint rx with trelegy / singulair  so self referred to pulmonary clinic.      Brief patient profile:  10/25/2017 1st [name] office visit/ Eric Spencer   Chief Complaint  Patient presents with  . Pulmonary Consult    [facility]  Pt c/o SOB and cough off and on for the past several years- worse over the past 2-3 wks. He c/o SOB with walking short distances. His cough is non prod. He is using his albuterol inhaler 4 x daily and neb with albuterol 3 x daily on average.   since acute symptoms coughing /sob more so than usual  Whereas 3 weeks prior to OV  Had been maintained on trelegy and singulair and rarely needing saba in any form then acutely worse and no better p pred and levaquin this time  Having severe coughing fits with min Mucus is still yellow esp in am none from nose  /has two more days levaquin  Despite severe cough sob better and Now can go up to steps better than a week .prior to OV   but still way over using saba including in waiting room today and misunderstands how/ when to use saba  Already seen in ER with tachycardia 10/18/17 with MI ruled out  Does better at hs now on 2 pillows in terms  of sob but severe hacking cough disrupts sleep rec Plan A = Automatic =  symbicort 160 Take 2 puffs first thing in am and then another 2 puffs about 12 hours later.  Work on inhaler technique:   Plan B = Backup Only use your albuterol as a rescue medication Plan C = Crisis - only use your albuterol/ipatropium  nebulizer if you first try Plan B For cough >  mucinex dm 1200 mg every 12 hours and use the flutter valve as needed  Stop losartan and start bystolic 10 mg daily until return  Pantoprazole (protonix) 40 mg   Take  30-60 min before first meal of the day and Pepcid (famotidine)  20 mg one @  bedtime until return to office - this is the best way to tell whether stomach acid is contributing to your problem.   GERD  Diet .    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 Follow-up    Cough has SOB improved and then worsened again approx 1 wk after the last visit. He has not had to use nebs.   Dyspnea:  Was able to do  [employer] not now Cough: worst first thing in am /light green in am / says using flutter but still feeling urge to force a cough daytime  Sleep: no with breathing problems or noct disturbance due to cough  SABA use:  Has not used it much  rec When you finish the bystolic switch over to bisprolol 5 mg one tablet daily  Work on maintaining perferct  inhaler technique: Prednisone 10 mg take  4 each am x 2 days,   2 each am x 2 days,  1 each am x 2 days and stop  Doxycline 100 mg twice daily x 10 days  schedule  Sinus  CT  neg Add: did not go to lab/ can wait until next ov     12/21/2017  f/u ov/Eric Spencer re:  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 Follow-up    PFT's done today. Pt states breathing seems worse since the last visit. He is using his albuterol inhaler 2 x daily on average and rarely uses neb.   Dyspnea:  Worse since April  2019  Cough:  Worse since mowed grass 12/18/17  SABA ZOX:WRUEAVWuse:reports quite a bit better p saba despite symb adherence at 2 puffs q 12 / always better with pred also 02: none   rec Prednisone 10 mg  X 2 each am until better then one daily until return  Plan A = Automatic = symbicort 160x 2 and spiriva 2 pffs each am / symbicort 160 x 2  Work on inhaler technique:  Plan B = Backup Only use your albuterol as a rescue medication Plan C = Crisis - only use your albuterol nebulizer if you first try Plan B and it fails to help > ok to use the nebulizer up to every 4 hours but if start needing it regularly call for immediate appointment    01/22/2018  f/u ov/Eric Spencer re:  GOLD IV  Copd / maint on sym/spiriva and prednisone  Chief Complaint  Patient presents with  . Follow-up    Breathing has been worse the past few days.  He is wheezing some and coughing with yellow sputum. He is using his albuterol inhaler 2 x daily and neb once per wk on average. He has been having some swelling in his ankles since he started amlodipine.   Dyspnea:  Push cart all day around [employer] slow pace = MMRC2 = can't walk a nl pace on a flat grade s sob but does fine slow and flat but breathing worse since mowed grass sev days prior to OV    Cough: daytime cough/ using flatter valve but not sure it's working properly, did not bring it   SABA use: sev times daily helps some 02:  No    No obvious day to day or daytime variability or assoc   mucus plugs or hemoptysis or cp or chest tightness, subjective wheeze or overt sinus or hb symptoms.   Sleeping: ok most nocts on side  without nocturnal  or early am exacerbation  of respiratory  c/o's or need for noct saba. Also denies any obvious fluctuation of symptoms with weather or environmental changes or other aggravating or alleviating factors except as outlined above   No unusual exposure hx or h/o childhood pna/ asthma or knowledge of premature birth.  Current Allergies,  Complete Past Medical History, Past Surgical History, Family History, and Social History were reviewed in [facility] electronic medical record.  ROS  The  following are not active complaints unless bolded Hoarseness, sore throat, dysphagia, dental problems, itching, sneezing,  nasal congestion or discharge of excess mucus or purulent secretions, ear ache,   fever, chills, sweats, unintended wt loss or wt gain, classically pleuritic or exertional cp,  orthopnea pnd or arm/hand swelling  or leg swelling, presyncope, palpitations, abdominal pain, anorexia, nausea, vomiting, diarrhea  or change in bowel habits or change in bladder habits, change in stools or change in urine, dysuria, hematuria,  rash, arthralgias, visual complaints, headache, numbness, weakness or ataxia or problems with walking or coordination,  change in mood or  memory.        Current Meds  Medication Sig  . albuterol (PROVENTIL) (2.5 MG/3ML) 0.083% nebulizer solution Take 3 mLs (2.5 mg total) by nebulization every 6 (six) hours as needed for wheezing or shortness of breath.  Marland Kitchen amLODipine (NORVASC) 10 MG tablet TAKE 1 TABLET BY MOUTH ONCE DAILY.  . bisoprolol (ZEBETA) 5 MG tablet Take 1 tablet (5 mg total) by mouth daily.  . budesonide-formoterol (SYMBICORT) 160-4.5 MCG/ACT inhaler Take 2 puffs first thing in am and then another 2 puffs about 12 hours later.  Marland Kitchen dextromethorphan-guaiFENesin (MUCINEX DM) 30-600 MG 12hr tablet Take 1 tablet by mouth 2 (two) times daily.  . famotidine (PEPCID) 20 MG tablet One at bedtime  . fluticasone (FLONASE) 50 MCG/ACT nasal spray Place 2 sprays into both nostrils daily.  Marland Kitchen ibuprofen (ADVIL,MOTRIN) 200 MG tablet Take 200 mg by mouth every 6 (six) hours as needed.  . pantoprazole (PROTONIX) 40 MG tablet TAKE ONE TABLET BY MOUTH DAILY 30 TO 60 MINUTES BEFORE FIRST MEAL OF THE DAY.  Marland Kitchen predniSONE (DELTASONE) 10 MG tablet Take  2 daily until better then one daily with bfast (Patient taking  differently: one daily with bfast)  . Respiratory Therapy Supplies (FLUTTER) DEVI 1 Device by Does not apply route as needed.  . Tiotropium Bromide Monohydrate (SPIRIVA RESPIMAT) 2.5 MCG/ACT AERS Inhale 2 puffs into the lungs daily.  .  montelukast (SINGULAIR) 10 MG tablet Take 1 tablet (10 mg total) by mouth daily.  . [ ]  VENTOLIN HFA 108 (90 Base) MCG/ACT inhaler Inhale 1-2 puffs into the lungs every 4 (four) hours as needed for wheezing or shortness of breath.            Objective:   Physical Exam  amb wm nad / congested sounding cough   01/22/2018      217  12/21/2017       206  11/09/2017       203   10/25/17 204 lb (92.5 kg)  10/18/17 208 lb (94.3 kg)  10/18/17 208 lb (94.3 kg)      Vital signs reviewed - Note on arrival 02 sats  92% on RA with bp 160/90 and pulse 99        HEENT: nl  oropharynx. Nl external ear canals without cough reflex - moderate bilateral non-specific turbinate edema  / full dentures    NECK :  without JVD/Nodes/TM/ nl carotid upstrokes bilaterally   LUNGS: no acc muscle use,  Mod barrel  contour chest wall with bilateral insp / exp rhonchi and  without cough on insp or exp maneuver and mod   Hyperresonant  to  percussion bilaterally     CV:  RRR  no s3 or murmur or increase in P2, and no edema   ABD:  soft and nontender with pos mid insp Hoover's  in the supine position. No bruits or  organomegaly appreciated, bowel sounds nl  MS:   Nl gait/  ext warm without deformities, calf tenderness, cyanosis or clubbing No obvious joint restrictions   SKIN: warm and dry without lesions    NEURO:  alert, approp, nl sensorium with  no motor or cerebellar deficits apparent.                   Assessment:

## 2018-01-22 NOTE — Patient Instructions (Addendum)
Put blocks under the head of your bed   Daliresp 500 mg every other day for a month then 500 mg daily    If breathing worse and need nebulizer >  Prednisone 10 mg 2 daily until better then one daily thereafter  For cough/ congestion > mucinex dm up to 1200 mg every 12 hours and use the flutter valve as much as possible   We will be referring you to pulmonary rehab and allergy   Please schedule a follow up office visit in 6 weeks, call sooner if needed  - consider hrct next to look for bronchiectasis

## 2018-01-23 ENCOUNTER — Telehealth: Payer: Self-pay | Admitting: Internal Medicine

## 2018-01-23 NOTE — Telephone Encounter (Signed)
[email or web address]'s Daliresp. PA was started via [email or web address]. Key is [id number]. Determination could take up to 3 days.   Will keep this encounter open.

## 2018-01-23 NOTE — Telephone Encounter (Signed)
Spoke with Acupuncturistcott at [facility]. Advised him that the Daliresp has been approved. He verbalized understanding and will get medication ready for the patient.

## 2018-01-24 ENCOUNTER — Other Ambulatory Visit: Payer: Self-pay | Admitting: Internal Medicine

## 2018-01-24 ENCOUNTER — Telehealth: Payer: Self-pay | Admitting: Internal Medicine

## 2018-01-24 DIAGNOSIS — J439 Emphysema, unspecified: Secondary | ICD-10-CM

## 2018-01-24 NOTE — Telephone Encounter (Signed)
Noted  

## 2018-01-26 ENCOUNTER — Encounter: Payer: Self-pay | Admitting: Internal Medicine

## 2018-01-26 NOTE — Assessment & Plan Note (Addendum)
Sinus ct 11/28/2017 >>> Clear paranasal sinuses Allergy profile 12/21/2017 >  Eos 0.0 /  IgE 540 RAST pos grass and trees but basically also everything else but cats   Again advised avoidance but he really likes to ride his mower even in dusty conditions >>  referral to allergist next step and stop singulair as it's not helping

## 2018-01-26 NOTE — Assessment & Plan Note (Addendum)
Changed arb to bystolic 10/25/2017 due to concerns re cough and tachycardia   Not optimally controlled on present regimen. I reviewed this with the patient and emphasized importance of follow-up with primary care/ may need higher dose BB and/or add on hctz as has noted some tendency to LE edema on amlodipine (though not apparent on today's exam)

## 2018-01-26 NOTE — Assessment & Plan Note (Signed)
PFT's  12/21/2017  FEV1 1.04 (29 % ) ratio 39  p 15 % improvement from saba p symbicort 160 x 2  prior to study with DLCO  37 % corrects to 41 % for alv volume   - 12/21/2017    start spiriva smi 2.5 x 2 qam - Prednisone maint rx 12/21/2017  - alpha one AT Screen   MM  191   - rec trial of dilaresp 01/22/2018  - 01/22/2018  After extensive coaching inhaler device  effectiveness =   75%hfa -90% with smi   DDX of  difficult airways management almost all start with A and  include Adherence, Ace Inhibitors, Acid Reflux, Active Sinus Disease, Alpha 1 Antitripsin deficiency, Anxiety masquerading as Airways dz,  ABPA,  Allergy(esp in young), Aspiration (esp in elderly), Adverse effects of meds,  Active smokers, A bunch of PE's (a small clot burden can't cause this syndrome unless there is already severe underlying pulm or vascular dz with poor reserve) plus two Bs  = Bronchiectasis and Beta blocker use..and one C= CHF   Adherence is always the initial "prime suspect" and is a multilayered concern that requires a "trust but verify" approach in every patient - starting with knowing how to use medications, especially inhalers, correctly, keeping up with refills and understanding the fundamental difference between maintenance and prns vs those medications only taken for a very short course and then stopped and not refilled.  - see hfa teaching - return with all meds/devices including flutter valve  in hand using a trust but verify approach to confirm accurate Medication  Reconciliation The principal here is that until we are certain that the  patients are doing what we've asked, it makes no sense to ask them to do more.    ? Allergy/ asthma component > see allergy profile 12/21/17 >>   Increase pred to 20 mg daily until better then 10 mg daily  - no benefit to singulair to date so rec d/c    ? Acid (or non-acid) GERD > always difficult to exclude as up to 75% of pts in some series report no assoc GI/ Heartburn  symptoms> rec continue max (24h)  acid suppression and diet restrictions/ reviewed      ? Alpha one > ruled out   ? Adverse effects of meds > none listed    ? Bronchiectasis > continue flutter valve > consider hrct next     I had an extended discussion with the patient/wife reviewing all relevant studies completed to date and  lasting 15 to 20 minutes of a 25 minute visit    See device teaching which extended face to face time for this visit.  Each maintenance medication was reviewed in detail including emphasizing most importantly the difference between maintenance and prns and under what circumstances the prns are to be triggered using an action plan format that is not reflected in the computer generated alphabetically organized AVS which I have not found useful in most complex patients, especially with respiratory illnesses  Please see AVS for specific instructions unique to this visit that I personally wrote and verbalized to the the pt in detail and then reviewed with pt  by my nurse highlighting any  changes in therapy recommended at today's visit to their plan of care.

## 2018-01-28 DIAGNOSIS — M9903 Segmental and somatic dysfunction of lumbar region: Secondary | ICD-10-CM | POA: Diagnosis not present

## 2018-01-28 DIAGNOSIS — M545 Low back pain: Secondary | ICD-10-CM | POA: Diagnosis not present

## 2018-01-31 DIAGNOSIS — M9903 Segmental and somatic dysfunction of lumbar region: Secondary | ICD-10-CM | POA: Diagnosis not present

## 2018-01-31 DIAGNOSIS — M545 Low back pain: Secondary | ICD-10-CM | POA: Diagnosis not present

## 2018-02-11 DIAGNOSIS — M9903 Segmental and somatic dysfunction of lumbar region: Secondary | ICD-10-CM | POA: Diagnosis not present

## 2018-02-11 DIAGNOSIS — M545 Low back pain: Secondary | ICD-10-CM | POA: Diagnosis not present

## 2018-02-19 ENCOUNTER — Encounter: Payer: Self-pay | Admitting: Allergy & Immunology

## 2018-02-19 ENCOUNTER — Ambulatory Visit (INDEPENDENT_AMBULATORY_CARE_PROVIDER_SITE_OTHER): Payer: Medicare Other | Admitting: Allergy & Immunology

## 2018-02-19 VITALS — BP 140/78 | HR 90 | Temp 97.8°F | Resp 19 | Ht 73.03 in | Wt 203.0 lb

## 2018-02-19 DIAGNOSIS — J449 Chronic obstructive pulmonary disease, unspecified: Secondary | ICD-10-CM

## 2018-02-19 DIAGNOSIS — J3089 Other allergic rhinitis: Secondary | ICD-10-CM | POA: Diagnosis not present

## 2018-02-19 DIAGNOSIS — K219 Gastro-esophageal reflux disease without esophagitis: Secondary | ICD-10-CM

## 2018-02-19 DIAGNOSIS — J302 Other seasonal allergic rhinitis: Secondary | ICD-10-CM

## 2018-02-19 DIAGNOSIS — B370 Candidal stomatitis: Secondary | ICD-10-CM

## 2018-02-19 NOTE — Progress Notes (Addendum)
NEW PATIENT  Date of Service/Encounter:  02/19/18  Referring provider: Patient, No Pcp Per   Assessment:   Seasonal and perennial allergic rhinitis (grasses, weeds, trees, Aspergillus, dog, dust mites, and roach)  Chronic obstructive pulmonary disease - on Symbicort, Spiriva, and Daliresp (followed by Dr. Sherene Sires)  GERD - on pantoprazole  Oral thrush   Mr. Eric Spencer is a 72 y.o. male with a long standing history of smoking, now with severe COPD. He is being followed by Pulmonology. He also has evidence of atopy, which is likely worsening his overall clinical course. He already had blood work that demonstrated sensitizations to grasses, weeds, trees, Aspergillus, dog, dust mites, and roach. Although some of these are not elevated, I would think that they are relevant given that he is at an age when allergies tend to dissipate. Therefore any evidence of an allergy at this age is likely something to pay attention to, despite the actual number. In any case, he does report that his breathing worsens in the spring when the pollen starts to become a problem, so it certainly fits with his clinical picture. He has not been maximized on his medications at this time, so it is reasonable to try medications before pursuing anything more aggressive such as allergen immunotherapy. This would be a somewhat risky endeavor anyway due to his pulmonary status (FEV in the 30% range today), so there would need to be a long risk versus benefit discussion if we advance to this step.   Plan/Recommendations:   1. Seasonal and perennial allergic rhinitis - Start taking: Zyrtec (cetirizine) 10mg  tablet once daily, Nasacort (triamcinolone) one spray per nostril daily and Astelin (azelastine) 2 sprays per nostril 1-2 times daily as needed - You can use an extra dose of the antihistamine, if needed, for breakthrough symptoms.  - Consider nasal saline rinses 1-2 times daily to remove allergens from the nasal cavities as  well as help with mucous clearance (this is especially helpful to do before the nasal sprays are given) - Consider allergy shots as a means of long-term control. - Allergy shots "re-train" and "reset" the immune system to ignore environmental allergens and decrease the resulting immune response to those allergens (sneezing, itchy watery eyes, runny nose, nasal congestion, etc).    - Allergy shots improve symptoms in 75-85% of patients.  - We can discuss more at the next appointment if the medications are not working for you.  2. Chronic obstructive pulmonary disease - Continue to follow with Dr. Sherene Sires.  - I did not make any changes to his pulmonary medications.  - I reiterated the need to perform swish and swallow after every time that you use the Symbicort.   3. Oral thrush - Start nystatin swish and spit four times daily for one week.  - Call us if there is no improvement at that time.   4. Return in about 3 months (around 05/22/2018).  Subjective:   Eric Spencer is a 72 y.o. male presenting today for evaluation of  Chief Complaint  Patient presents with  . Cough    Eric Spencer has a history of the following: Patient Active Problem List   Diagnosis Date Noted  . Seasonal and perennial allergic rhinitis 02/20/2018  . Gastroesophageal reflux disease 02/20/2018  . Cough 11/10/2017  . COPD with acute exacerbation (HCC) 10/25/2017  . Chronic obstructive pulmonary disease (HCC) 01/13/2016  . Essential hypertension 01/13/2016  . Peripheral neuropathy 01/13/2016    History obtained from:  chart review and patient.  Eric Spencer was referred by Patient, No Pcp Per.     Eric Spencer is a 72 y.o. male presenting for an evaluation of allergies. He has a history of COPD Gold Stage IV and is followed by Dr. Sherene Sires. Recently he was started on Daliresp 500mg  every other day for one month and he will be increasing up to daily next month. He remains on prednisone 10mg  daily (has  been on daily prednisone since March 2019). He is on Ventolin for a rescue inhaler and Spiriva and Symbicort as controller. He actually quit taking the Spiriva since it was so expensive.    Overall his breathing has worsened over the past 3-4 years. He continues to "keep the asthma mess" which starts when the pollen starts coming out. He does know that he has sensitivities to certain scents including gas and other scents. He did have an environmental panel performed in June 2019 that showed sensitizations to dust mites, Aspergillus, dog dander, roach, trees, grasses, ragweed, and weeds. He does have problems since moving back here 7 years ago.  [place]  It does not seem that he has tried many medications for this.  He is on Flonase, which he has not used consistently.  He was started on montelukast at some point in the last year, and does not feel that this is helped much.  He does not use any nasal saline rinses.  He has not been on any antihistamines.  Otherwise, there is no history of other atopic diseases, including drug allergies, food allergies, stinging insect allergies, or urticaria. There is no significant infectious history. Vaccinations are up to date.    Past Medical History: Patient Active Problem List   Diagnosis Date Noted  . Seasonal and perennial allergic rhinitis 02/20/2018  . Gastroesophageal reflux disease 02/20/2018  . Cough 11/10/2017  . COPD with acute exacerbation (HCC) 10/25/2017  . Chronic obstructive pulmonary disease (HCC) 01/13/2016  . Essential hypertension 01/13/2016  . Peripheral neuropathy 01/13/2016    Medication List:  Allergies as of 02/19/2018      Reactions   Penicillins       Medication List        Accurate as of 02/19/18 11:59 PM. Always use your most recent med list.          albuterol (2.5 MG/3ML) 0.083% nebulizer solution Commonly known as:  PROVENTIL Take 3 mLs (2.5 mg total) by nebulization every 6 (six) hours  as needed for wheezing or shortness of breath.   VENTOLIN HFA 108 (90 Base) MCG/ACT inhaler Generic drug:  albuterol INHALE 1 TO 2 PUFFS INTO THE LUNGS EVERY 4 HOURS AS NEEDED.   amLODipine 10 MG tablet Commonly known as:  NORVASC TAKE 1 TABLET BY MOUTH ONCE DAILY.   aspirin EC 81 MG tablet Take 81 mg by mouth daily.   azelastine 0.1 % nasal spray Commonly known as:  ASTELIN Place 2 sprays into both nostrils 2 (two) times daily.   bisoprolol 5 MG tablet Commonly known as:  ZEBETA Take 1 tablet (5 mg total) by mouth daily.   budesonide-formoterol 160-4.5 MCG/ACT inhaler Commonly known as:  SYMBICORT Take 2 puffs first thing in am and then another 2 puffs about 12 hours later.   dextromethorphan-guaiFENesin 30-600 MG 12hr tablet Commonly known as:  MUCINEX DM Take 1 tablet by mouth 2 (two) times daily.   famotidine 20 MG tablet Commonly known as:  PEPCID One at  bedtime   FLUTTER Devi 1 Device by Does not apply route as needed.   ibuprofen 200 MG tablet Commonly known as:  ADVIL,MOTRIN Take 200 mg by mouth every 6 (six) hours as needed.   montelukast 10 MG tablet Commonly known as:  SINGULAIR TAKE 1 TABLET BY MOUTH ONCE DAILY.   nystatin 100000 UNIT/ML suspension Commonly known as:  MYCOSTATIN Take 5 mLs (500,000 Units total) by mouth 4 (four) times daily for 7 days.   pantoprazole 40 MG tablet Commonly known as:  PROTONIX TAKE ONE TABLET BY MOUTH DAILY 30 TO 60 MINUTES BEFORE FIRST MEAL OF THE DAY.   predniSONE 10 MG tablet Commonly known as:  DELTASONE Take  2 daily until better then one daily with bfast   roflumilast 500 MCG Tabs tablet Commonly known as:  DALIRESP Take 1 tablet (500 mcg total) by mouth daily.   Tiotropium Bromide Monohydrate 2.5 MCG/ACT Aers Inhale 2 puffs into the lungs daily.   triamcinolone 55 MCG/ACT Aero nasal inhaler Commonly known as:  NASACORT Place 2 sprays into the nose daily.       Birth History: non-contributory.    Developmental History: non-contributory.   Past Surgical History: Past Surgical History:  Procedure Laterality Date  . ADENOIDECTOMY    . HERNIA REPAIR    . TONSILLECTOMY       Family History: Family History  Problem Relation Age of Onset  . Hypertension Mother   . Arthritis Father   . Stroke Father   . Diabetes Maternal Grandmother      Social History: Avaneesh lives at home with his wife. He is a retired. He lives in a house that was built in 1974 but remodeled in 2014. There is carpeting in the main living areas and carpeting in the bedrooms. There is electric heating and central cooling. There is a dog and a cat in the home. There are no dust mite coverings on the bedding. There is no tobacco exposure in the home. He was a smoker from 28 through 2016.     Review of Systems: a 14-point review of systems is pertinent for what is mentioned in HPI.  Otherwise, all other systems were negative. Constitutional: negative other than that listed in the HPI Eyes: negative other than that listed in the HPI Ears, nose, mouth, throat, and face: negative other than that listed in the HPI Respiratory: negative other than that listed in the HPI Cardiovascular: negative other than that listed in the HPI Gastrointestinal: negative other than that listed in the HPI Genitourinary: negative other than that listed in the HPI Integument: negative other than that listed in the HPI Hematologic: negative other than that listed in the HPI Musculoskeletal: negative other than that listed in the HPI Neurological: negative other than that listed in the HPI Allergy/Immunologic: negative other than that listed in the HPI    Objective:   Blood pressure 140/78, pulse 90, temperature 97.8 F (36.6 C), resp. rate 19, height 6' 1.03" (1.855 m), weight 203 lb (92.1 kg), SpO2 97 %. Body mass index is 26.76 kg/m.   Physical Exam:  General: Alert, interactive, in no acute distress. Able to speak in  full sentences. Eyes: No conjunctival injection bilaterally, no discharge on the right, no discharge on the left and no Horner-Trantas dots present. PERRL bilaterally. EOMI without pain. No photophobia.  Ears: Right TM pearly gray with normal light reflex, Left TM pearly gray with normal light reflex, Right TM intact without perforation and Left TM intact  without perforation.  Nose/Throat: External nose within normal limits and septum midline. Turbinates edematous with clear discharge. Posterior oropharynx markedly erythematous without cobblestoning in the posterior oropharynx. Tonsils 2+ without exudates.  Oral thrush present. Neck: Supple without thyromegaly. Trachea midline. Adenopathy: no enlarged lymph nodes appreciated in the anterior cervical, occipital, axillary, epitrochlear, inguinal, or popliteal regions. Lungs: Decreased breath sounds bilaterally without wheezing, rhonchi or rales although there in some intermittent expiratory wheezing in the posterior right fields. No increased work of breathing. CV: Normal S1/S2. No murmurs. Capillary refill <2 seconds.  Abdomen: Nondistended, nontender. No guarding or rebound tenderness. Bowel sounds present in all fields and hypoactive  Skin: Warm and dry, without lesions or rashes. Extremities:  No clubbing, cyanosis or edema. Neuro:   Grossly intact. No focal deficits appreciated. Responsive to questions.  Diagnostic studies:  Spirometry: results abnormal (FEV1: 0.95/30%, FVC: 2.75/57%, FEV1/FVC: 34%).    Spirometry consistent with mixed obstructive and restrictive disease.   Allergy Studies:  none (deferred due to pulmonary status)     Eric BondsJoel Delle Andrzejewski, MD [facility]

## 2018-02-19 NOTE — Patient Instructions (Signed)
1. Seasonal and perennial allergic rhinitis - Start taking: Zyrtec (cetirizine) 10mg  tablet once daily, Nasacort (triamcinolone) one spray per nostril daily and Astelin (azelastine) 2 sprays per nostril 1-2 times daily as needed - You can use an extra dose of the antihistamine, if needed, for breakthrough symptoms.  - Consider nasal saline rinses 1-2 times daily to remove allergens from the nasal cavities as well as help with mucous clearance (this is especially helpful to do before the nasal sprays are given) - Consider allergy shots as a means of long-term control. - Allergy shots "re-train" and "reset" the immune system to ignore environmental allergens and decrease the resulting immune response to those allergens (sneezing, itchy watery eyes, runny nose, nasal congestion, etc).    - Allergy shots improve symptoms in 75-85% of patients.  - We can discuss more at the next appointment if the medications are not working for you.  2. Chronic obstructive pulmonary disease, unspecified COPD type (HCC) - Continue to follow with Dr. Sherene Sires.   3. Return in about 3 months (around 05/22/2018).   Please inform us of any Emergency Department visits, hospitalizations, or changes in symptoms. Call us before going to the ED for breathing or allergy symptoms since we might be able to fit you in for a sick visit. Feel free to contact us anytime with any questions, problems, or concerns.  It was a pleasure to meet you today!  Websites that have reliable patient information: 1. American Academy of Asthma, Allergy, and Immunology: www.aaaai.org 2. Food Allergy Research and Education (FARE): foodallergy.org 3. Mothers of Asthmatics: http://www.asthmacommunitynetwork.org 4. American College of Allergy, Asthma, and Immunology: MissingWeapons.ca   Make sure you are registered to vote! If you have moved or changed any of your contact information, you will need to get this updated before voting!       Reducing  Pollen Exposure  The American Academy of Allergy, Asthma and Immunology suggests the following steps to reduce your exposure to pollen during allergy seasons.    1. Do not hang sheets or clothing out to dry; pollen may collect on these items. 2. Do not mow lawns or spend time around freshly cut grass; mowing stirs up pollen. 3. Keep windows closed at night.  Keep car windows closed while driving. 4. Minimize morning activities outdoors, a time when pollen counts are usually at their highest. 5. Stay indoors as much as possible when pollen counts or humidity is high and on windy days when pollen tends to remain in the air longer. 6. Use air conditioning when possible.  Many air conditioners have filters that trap the pollen spores. 7. Use a HEPA room air filter to remove pollen form the indoor air you breathe.  Control of Mold Allergen   Mold and fungi can grow on a variety of surfaces provided certain temperature and moisture conditions exist.  Outdoor molds grow on plants, decaying vegetation and soil.  The major outdoor mold, Alternaria and Cladosporium, are found in very high numbers during hot and dry conditions.  Generally, a late Summer - Fall peak is seen for common outdoor fungal spores.  Rain will temporarily lower outdoor mold spore count, but counts rise rapidly when the rainy period ends.  The most important indoor molds are Aspergillus and Penicillium.  Dark, humid and poorly ventilated basements are ideal sites for mold growth.  The next most common sites of mold growth are the bathroom and the kitchen.   Control of House Dust Mite Allergen  House dust mites play a major role in allergic asthma and rhinitis.  They occur in environments with high humidity wherever human skin, the food for dust mites is found. High levels have been detected in dust obtained from mattresses, pillows, carpets, upholstered furniture, bed covers, clothes and soft toys.  The principal allergen of the  house dust mite is found in its feces.  A gram of dust may contain 1,000 mites and 250,000 fecal particles.  Mite antigen is easily measured in the air during house cleaning activities.    1. Encase mattresses, including the box spring, and pillow, in an air tight cover.  Seal the zipper end of the encased mattresses with wide adhesive tape. 2. Wash the bedding in water of 130 degrees Farenheit weekly.  Avoid cotton comforters/quilts and flannel bedding: the most ideal bed covering is the dacron comforter. 3. Remove all upholstered furniture from the bedroom. 4. Remove carpets, carpet padding, rugs, and non-washable window drapes from the bedroom.  Wash drapes weekly or use plastic window coverings. 5. Remove all non-washable stuffed toys from the bedroom.  Wash stuffed toys weekly. 6. Have the room cleaned frequently with a vacuum cleaner and a damp dust-mop.  The patient should not be in a room which is being cleaned and should wait 1 hour after cleaning before going into the room. 7. Close and seal all heating outlets in the bedroom.  Otherwise, the room will become filled with dust-laden air.  An electric heater can be used to heat the room. 8. Reduce indoor humidity to less than 50%.  Do not use a humidifier.  Control of Dog or Cat Allergen  Avoidance is the best way to manage a dog or cat allergy. If you have a dog or cat and are allergic to dog or cats, consider removing the dog or cat from the home. If you have a dog or cat but don't want to find it a new home, or if your family wants a pet even though someone in the household is allergic, here are some strategies that may help keep symptoms at bay:  1. Keep the pet out of your bedroom and restrict it to only a few rooms. Be advised that keeping the dog or cat in only one room will not limit the allergens to that room. 2. Don't pet, hug or kiss the dog or cat; if you do, wash your hands with soap and water. 3. High-efficiency particulate  air (HEPA) cleaners run continuously in a bedroom or living room can reduce allergen levels over time. 4. Regular use of a high-efficiency vacuum cleaner or a central vacuum can reduce allergen levels. 5. Giving your dog or cat a bath at least once a week can reduce airborne allergen.  Allergy Shots   Allergies are the result of a chain reaction that starts in the immune system. Your immune system controls how your body defends itself. For instance, if you have an allergy to pollen, your immune system identifies pollen as an invader or allergen. Your immune system overreacts by producing antibodies called Immunoglobulin E (IgE). These antibodies travel to cells that release chemicals, causing an allergic reaction.  The concept behind allergy immunotherapy, whether it is received in the form of shots or tablets, is that the immune system can be desensitized to specific allergens that trigger allergy symptoms. Although it requires time and patience, the payback can be long-term relief.  How Do Allergy Shots Work?  Allergy shots work much like a vaccine.  Your body responds to injected amounts of a particular allergen given in increasing doses, eventually developing a resistance and tolerance to it. Allergy shots can lead to decreased, minimal or no allergy symptoms.  There generally are two phases: build-up and maintenance. Build-up often ranges from three to six months and involves receiving injections with increasing amounts of the allergens. The shots are typically given once or twice a week, though more rapid build-up schedules are sometimes used.  The maintenance phase begins when the most effective dose is reached. This dose is different for each person, depending on how allergic you are and your response to the build-up injections. Once the maintenance dose is reached, there are longer periods between injections, typically two to four weeks.  Occasionally doctors give cortisone-type shots that  can temporarily reduce allergy symptoms. These types of shots are different and should not be confused with allergy immunotherapy shots.  Who Can Be Treated with Allergy Shots?  Allergy shots may be a good treatment approach for people with allergic rhinitis (hay fever), allergic asthma, conjunctivitis (eye allergy) or stinging insect allergy.   Before deciding to begin allergy shots, you should consider:  . The length of allergy season and the severity of your symptoms . Whether medications and/or changes to your environment can control your symptoms . Your desire to avoid long-term medication use . Time: allergy immunotherapy requires a major time commitment . Cost: may vary depending on your insurance coverage  Allergy shots for children age 84 and older are effective and often well tolerated. They might prevent the onset of new allergen sensitivities or the progression to asthma.  Allergy shots are not started on patients who are pregnant but can be continued on patients who become pregnant while receiving them. In some patients with other medical conditions or who take certain common medications, allergy shots may be of risk. It is important to mention other medications you talk to your allergist.   When Will I Feel Better?  Some may experience decreased allergy symptoms during the build-up phase. For others, it may take as long as 12 months on the maintenance dose. If there is no improvement after a year of maintenance, your allergist will discuss other treatment options with you.  If you aren't responding to allergy shots, it may be because there is not enough dose of the allergen in your vaccine or there are missing allergens that were not identified during your allergy testing. Other reasons could be that there are high levels of the allergen in your environment or major exposure to non-allergic triggers like tobacco smoke.  What Is the Length of Treatment?  Once the maintenance  dose is reached, allergy shots are generally continued for three to five years. The decision to stop should be discussed with your allergist at that time. Some people may experience a permanent reduction of allergy symptoms. Others may relapse and a longer course of allergy shots can be considered.  What Are the Possible Reactions?  The two types of adverse reactions that can occur with allergy shots are local and systemic. Common local reactions include very mild redness and swelling at the injection site, which can happen immediately or several hours after. A systemic reaction, which is less common, affects the entire body or a particular body system. They are usually mild and typically respond quickly to medications. Signs include increased allergy symptoms such as sneezing, a stuffy nose or hives.  Rarely, a serious systemic reaction called anaphylaxis can develop. Symptoms include  swelling in the throat, wheezing, a feeling of tightness in the chest, nausea or dizziness. Most serious systemic reactions develop within 30 minutes of allergy shots. This is why it is strongly recommended you wait in your doctor's office for 30 minutes after your injections. Your allergist is trained to watch for reactions, and his or her staff is trained and equipped with the proper medications to identify and treat them.  Who Should Administer Allergy Shots?  The preferred location for receiving shots is your prescribing allergist's office. Injections can sometimes be given at another facility where the physician and staff are trained to recognize and treat reactions, and have received instructions by your prescribing allergist.

## 2018-02-20 ENCOUNTER — Encounter: Payer: Self-pay | Admitting: Allergy & Immunology

## 2018-02-20 DIAGNOSIS — J302 Other seasonal allergic rhinitis: Secondary | ICD-10-CM | POA: Insufficient documentation

## 2018-02-20 DIAGNOSIS — J3089 Other allergic rhinitis: Principal | ICD-10-CM

## 2018-02-20 DIAGNOSIS — K219 Gastro-esophageal reflux disease without esophagitis: Secondary | ICD-10-CM | POA: Insufficient documentation

## 2018-02-20 DIAGNOSIS — M545 Low back pain: Secondary | ICD-10-CM | POA: Diagnosis not present

## 2018-02-20 DIAGNOSIS — M9903 Segmental and somatic dysfunction of lumbar region: Secondary | ICD-10-CM | POA: Diagnosis not present

## 2018-02-20 MED ORDER — NYSTATIN 100000 UNIT/ML MT SUSP: 5.0000 mL | Freq: Four times a day (QID) | OROMUCOSAL | 0 refills | Status: AC

## 2018-02-20 MED ORDER — TRIAMCINOLONE ACETONIDE 55 MCG/ACT NA AERO: 2.0000 | INHALATION_SPRAY | Freq: Every day | NASAL | 12 refills | Status: DC

## 2018-02-20 MED ORDER — AZELASTINE HCL 0.1 % NA SOLN: 2.0000 | Freq: Two times a day (BID) | NASAL | 5 refills | Status: DC

## 2018-02-20 NOTE — Addendum Note (Signed)
Addended by: Alfonse SpruceGALLAGHER, Harland Aguiniga LOUIS on: 02/20/2018 01:20 PM   Modules accepted: Orders

## 2018-02-20 NOTE — Progress Notes (Signed)
I called and informed the patient of the treatment plan for his thrush.

## 2018-02-26 ENCOUNTER — Telehealth: Payer: Self-pay | Admitting: Internal Medicine

## 2018-02-26 NOTE — Telephone Encounter (Signed)
Called and spoke to patient. Patient stated that he mowed grass last week and since then has had increased shortness of breath. Patient stated he feels like the grass got into his lungs. Patient stated he is still taking the prednisone. Scheduled patient for an acute visit with NP on 02/27/18 at 10:30am. Nothing further needed at this time.

## 2018-02-27 ENCOUNTER — Encounter: Payer: Self-pay | Admitting: Nurse Practitioner

## 2018-02-27 ENCOUNTER — Ambulatory Visit (INDEPENDENT_AMBULATORY_CARE_PROVIDER_SITE_OTHER): Payer: Medicare Other | Admitting: Nurse Practitioner

## 2018-02-27 VITALS — BP 140/82 | HR 73 | Ht 73.0 in | Wt 204.6 lb

## 2018-02-27 DIAGNOSIS — J441 Chronic obstructive pulmonary disease with (acute) exacerbation: Secondary | ICD-10-CM

## 2018-02-27 DIAGNOSIS — J3089 Other allergic rhinitis: Secondary | ICD-10-CM | POA: Diagnosis not present

## 2018-02-27 DIAGNOSIS — J302 Other seasonal allergic rhinitis: Secondary | ICD-10-CM

## 2018-02-27 MED ORDER — AZITHROMYCIN 250 MG PO TABS: ORAL_TABLET | ORAL | 0 refills | Status: DC

## 2018-02-27 MED ORDER — PREDNISONE 10 MG PO TABS: ORAL_TABLET | ORAL | 0 refills | Status: DC

## 2018-02-27 NOTE — Assessment & Plan Note (Signed)
Patient Instructions  Please take zyrtec as directed by allergy Please restart daliresp as ordered Will order prednisone taper Will order azithromycin Continue Symbicort and singulair Please keep already scheduled follow up with Dr. Sherene Sires on 03/18/18 Please call if symptoms worsen or fail to improve

## 2018-02-27 NOTE — Patient Instructions (Addendum)
Please take zyrtec as directed by allergy Please restart daliresp as ordered Will order prednisone taper Will order azithromycin Continue Symbicort and singulair Please keep already scheduled follow up with Dr. Sherene Sires on 03/18/18 Please call if symptoms worsen or fail to improve

## 2018-02-27 NOTE — Progress Notes (Signed)
@Patient  ID: Eric Spencer, male    DOB: 07/17/1945, 72 y.o.   MRN: [id number]  Chief Complaint  Patient presents with  . Shortness of Breath    Has had SOB for years, but worse after cutting grass last week.    Referring provider: No ref. provider found  HPI  72 year old male current smoker with COPD GOLD stage IV, allergic rhinitis followed by Dr. Sherene Sires.  Health history includes GERD and HTN.   Tests:  PFT's  12/21/2017  FEV1 1.04 (29 % ) ratio 39  p 15 % improvement from saba p symbicort 160 x 2  prior to study with DLCO  37 % corrects to 41 % for alv volume   - 12/21/2017    start spiriva smi 2.5 x 2 qam - Prednisone maint rx 12/21/2017  - alpha one AT Screen   MM  191   - rec trial of dilaresp 01/22/2018  - 01/22/2018  After extensive coaching inhaler device  effectiveness =   75%hfa -90% with smi  OV 02/27/18 - Acute Patient presents today with sinus and chest congestion after mowing the yard 1 week ago. States that he has been having shortness of breath, productive cough with green sputum and nasal congestion with post nasal drip.  States that symptoms have progressively worsened. Denies any fever or peripheral edema. Has not been taking daliresp or zyrtec as directed. He is compliant with Symbicort and Singulair.     Allergies  Allergen Reactions  . Penicillins     Immunization History  Administered Date(s) Administered  . Influenza, High Dose Seasonal PF 09/07/2017  . Pneumococcal Conjugate-13 05/08/2017    Past Medical History:  Diagnosis Date  . Arthritis   . Asthma   . COPD (chronic obstructive pulmonary disease) (HCC)   . Hypertension     Tobacco History: Social History   Tobacco Use  Smoking Status Former Smoker  . Packs/day: 1.00  . Years: 30.00  . Pack years: 30.00  . Types: Cigarettes  . Last attempt to quit: 10/27/2016  . Years since quitting: 70.0  Smokeless Tobacco Never Used   Counseling given: Yes   Outpatient Encounter Medications  as of 02/27/2018  Medication Sig  . albuterol (PROVENTIL) (2.5 MG/3ML) 0.083% nebulizer solution Take 3 mLs (2.5 mg total) by nebulization every 6 (six) hours as needed for wheezing or shortness of breath.  Marland Kitchen amLODipine (NORVASC) 10 MG tablet TAKE 1 TABLET BY MOUTH ONCE DAILY.  Marland Kitchen aspirin EC 81 MG tablet Take 81 mg by mouth daily.  Marland Kitchen azelastine (ASTELIN) 0.1 % nasal spray Place 2 sprays into both nostrils 2 (two) times daily.  . bisoprolol (ZEBETA) 5 MG tablet Take 1 tablet (5 mg total) by mouth daily.  . budesonide-formoterol (SYMBICORT) 160-4.5 MCG/ACT inhaler Take 2 puffs first thing in am and then another 2 puffs about 12 hours later.  Marland Kitchen dextromethorphan-guaiFENesin (MUCINEX DM) 30-600 MG 12hr tablet Take 1 tablet by mouth 2 (two) times daily.  . famotidine (PEPCID) 20 MG tablet One at bedtime  . ibuprofen (ADVIL,MOTRIN) 200 MG tablet Take 200 mg by mouth every 6 (six) hours as needed.  . montelukast (SINGULAIR) 10 MG tablet TAKE 1 TABLET BY MOUTH ONCE DAILY.  Marland Kitchen nystatin (MYCOSTATIN) 100000 UNIT/ML suspension Take 5 mLs (500,000 Units total) by mouth 4 (four) times daily for 7 days.  . pantoprazole (PROTONIX) 40 MG tablet TAKE ONE TABLET BY MOUTH DAILY 30 TO 60 MINUTES BEFORE FIRST MEAL OF THE  DAY.  . predniSONE (DELTASONE) 10 MG tablet Take  2 daily until better then one daily with bfast (Patient taking differently: one daily with bfast)  . Respiratory Therapy Supplies (FLUTTER) DEVI 1 Device by Does not apply route as needed.  . roflumilast (DALIRESP) 500 MCG TABS tablet Take 1 tablet (500 mcg total) by mouth daily. (Patient taking differently: Take 500 mcg by mouth every other day. )  . Tiotropium Bromide Monohydrate (SPIRIVA RESPIMAT) 2.5 MCG/ACT AERS Inhale 2 puffs into the lungs daily.  Marland Kitchen triamcinolone (NASACORT) 55 MCG/ACT AERO nasal inhaler Place 2 sprays into the nose daily.  . VENTOLIN HFA 108 (90 Base) MCG/ACT inhaler INHALE 1 TO 2 PUFFS INTO THE LUNGS EVERY 4 HOURS AS NEEDED.  Marland Kitchen  azithromycin (ZITHROMAX) 250 MG tablet Take 2 tablets (500 mg) on day 1, then take 1 tablet (250 mg) daily on days 2-5  . predniSONE (DELTASONE) 10 MG tablet Take 3 tabs for 2 days, then 2 tabs for 2 days, then 1 tab for 2 days, then stop   No facility-administered encounter medications on file as of 02/27/2018.      Review of Systems  Review of Systems  Constitutional: Negative.  Negative for chills and fever.  HENT: Positive for congestion and postnasal drip.   Respiratory: Positive for cough, shortness of breath and wheezing.   Cardiovascular: Negative.   Gastrointestinal: Negative.   Allergic/Immunologic: Negative.   Neurological: Negative.   Psychiatric/Behavioral: Negative.        Physical Exam  BP 140/82 (BP Location: Left Arm, Patient Position: Sitting, Cuff Size: Normal)   Pulse 73   Ht 6\' 1"  (1.854 m)   Wt 204 lb 9.6 oz (92.8 kg)   SpO2 97%   BMI 26.99 kg/m   Wt Readings from Last 5 Encounters:  02/27/18 204 lb 9.6 oz (92.8 kg)  02/19/18 203 lb (92.1 kg)  01/22/18 217 lb (98.4 kg)  12/21/17 206 lb (93.4 kg)  11/09/17 203 lb 12.8 oz (92.4 kg)     Physical Exam  Constitutional: He is oriented to person, place, and time. He appears well-developed and well-nourished. No distress.  Cardiovascular: Normal rate and regular rhythm.  Pulmonary/Chest: Effort normal and breath sounds normal. He has no wheezes. He has no rales.  Neurological: He is alert and oriented to person, place, and time.  Skin: Skin is warm and dry.  Psychiatric: He has a normal mood and affect.  Nursing note and vitals reviewed.     Assessment & Plan:   COPD with acute exacerbation [facility]) Patient Instructions  Please take zyrtec as directed by allergy Please restart daliresp as ordered Will order prednisone taper Will order azithromycin Continue Symbicort and singulair Please keep already scheduled follow up with Dr. Sherene Sires on 03/18/18 Please call if symptoms worsen or fail to  improve     Seasonal and perennial allergic rhinitis Zyrtec May continue mucinex Continue nasocort     Ivonne Andrew, NP 02/27/2018

## 2018-02-27 NOTE — Assessment & Plan Note (Signed)
Zyrtec May continue mucinex Continue nasocort

## 2018-03-04 ENCOUNTER — Other Ambulatory Visit: Payer: Self-pay | Admitting: Internal Medicine

## 2018-03-04 DIAGNOSIS — J439 Emphysema, unspecified: Secondary | ICD-10-CM

## 2018-03-04 NOTE — Progress Notes (Signed)
Chart and office note reviewed in detail  > agree with a/p as outlined    

## 2018-03-12 ENCOUNTER — Other Ambulatory Visit: Payer: Self-pay | Admitting: Internal Medicine

## 2018-03-12 ENCOUNTER — Ambulatory Visit: Payer: Medicare Other | Admitting: Allergy & Immunology

## 2018-03-13 DIAGNOSIS — M9903 Segmental and somatic dysfunction of lumbar region: Secondary | ICD-10-CM | POA: Diagnosis not present

## 2018-03-13 DIAGNOSIS — M545 Low back pain: Secondary | ICD-10-CM | POA: Diagnosis not present

## 2018-03-14 ENCOUNTER — Encounter (HOSPITAL_COMMUNITY): Payer: Medicare Other

## 2018-03-15 DIAGNOSIS — M545 Low back pain: Secondary | ICD-10-CM | POA: Diagnosis not present

## 2018-03-15 DIAGNOSIS — M9903 Segmental and somatic dysfunction of lumbar region: Secondary | ICD-10-CM | POA: Diagnosis not present

## 2018-03-18 ENCOUNTER — Ambulatory Visit (INDEPENDENT_AMBULATORY_CARE_PROVIDER_SITE_OTHER): Payer: Medicare Other | Admitting: Internal Medicine

## 2018-03-18 ENCOUNTER — Encounter: Payer: Self-pay | Admitting: Internal Medicine

## 2018-03-18 VITALS — BP 122/66 | HR 82 | Ht 73.0 in | Wt 201.0 lb

## 2018-03-18 DIAGNOSIS — R059 Cough, unspecified: Secondary | ICD-10-CM

## 2018-03-18 DIAGNOSIS — R05 Cough: Secondary | ICD-10-CM

## 2018-03-18 DIAGNOSIS — J449 Chronic obstructive pulmonary disease, unspecified: Secondary | ICD-10-CM | POA: Diagnosis not present

## 2018-03-18 MED ORDER — ARFORMOTEROL TARTRATE 15 MCG/2ML IN NEBU: 15.0000 ug | INHALATION_SOLUTION | Freq: Two times a day (BID) | RESPIRATORY_TRACT | 6 refills | Status: DC

## 2018-03-18 MED ORDER — REVEFENACIN 175 MCG/3ML IN SOLN: 1.0000 | Freq: Every day | RESPIRATORY_TRACT | 0 refills | Status: DC

## 2018-03-18 MED ORDER — PREDNISONE 10 MG PO TABS: ORAL_TABLET | ORAL | 0 refills | Status: DC

## 2018-03-18 MED ORDER — REVEFENACIN 175 MCG/3ML IN SOLN: 3.0000 mL | Freq: Every day | RESPIRATORY_TRACT | 11 refills | Status: AC

## 2018-03-18 MED ORDER — ARFORMOTEROL TARTRATE 15 MCG/2ML IN NEBU: 15.0000 ug | INHALATION_SOLUTION | Freq: Two times a day (BID) | RESPIRATORY_TRACT | 11 refills | Status: AC

## 2018-03-18 NOTE — Progress Notes (Signed)
Patient seen in the office today and instructed on use of yupelri nebulizer solution and brovana nebulizer solution.  Patient expressed understanding and demonstrated technique.

## 2018-03-18 NOTE — Patient Instructions (Addendum)
Plan A = Automatic =  Performist(or Brovana)  twice daily and Yupelri once daily (stop symbicort)    Plan B = Backup Only use your albuterol as a rescue medication to be used if you can't catch your breath by resting or doing a relaxed purse lip breathing pattern.  - The less you use it, the better it will work when you need it. - Ok to use the inhaler up to 2 puffs  every 4 hours if you must but call for appointment if use goes up over your usual need - Don't leave home without it !!  (think of it like the spare tire for your car)   Plan C = Crisis - only use your albuterol nebulizer if you first try Plan B and it fails to help > ok to use the nebulizer up to every 4 hours but if start needing it regularly call for immediate appointment   Plan D = Deltasone If you feel like you are loosing ground > prednisone 20 mg per day until better then 10 mg per day x 5 days and stop  Please see patient coordinator before you leave today  to schedule [facility] to deliver neb and meds   Please schedule a follow up office visit in 4 weeks, sooner if needed

## 2018-03-18 NOTE — Progress Notes (Addendum)
Subjective:     Patient ID: Eric Spencer, male   DOB: 03-28-1946    MRN: [id number]    Brief patient profile:  [age]  yowm  Quit smoking 10/2016  asthma as child up until age 89  Played baseball/ wrestling in HS with sinus problems fall and spring in his 70's while in [place] better with cortisone sprays  starting age 72 and better after teeth pulled around 2009  Then 2010 moved back to [place] then next year developed seasonal cough/ sob better p abx/pred and prn saba and completely better in between upper resp symptoms  s any chronic rx and then around 2017 noted chronic doe/ worse with bending over > then more chronic cough/sob  since May 2018 despite stopping smoking and starting on trelegy and singulair and then much worse x early April 2019 while on maint rx with trelegy / singulair  so self referred to pulmonary clinic.      Brief patient profile:  10/25/2017 1st [name] office visit/ Tereso Unangst   Chief Complaint  Patient presents with  . Pulmonary Consult    [facility]  Pt c/o SOB and cough off and on for the past several years- worse over the past 2-3 wks. He c/o SOB with walking short distances. His cough is non prod. He is using his albuterol inhaler 4 x daily and neb with albuterol 3 x daily on average.   since acute symptoms coughing /sob more so than usual  Whereas 3 weeks prior to OV  Had been maintained on trelegy and singulair and rarely needing saba in any form then acutely worse and no better p pred and levaquin this time  Having severe coughing fits with min Mucus is still yellow esp in am none from nose  /has two more days levaquin  Despite severe cough sob better and Now can go up to steps better than a week .prior to OV   but still way over using saba including in waiting room today and misunderstands how/ when to use saba  Already seen in ER with tachycardia 10/18/17 with MI ruled out  Does better at hs now on 2 pillows in terms  of sob but severe hacking cough disrupts sleep rec Plan A = Automatic =  symbicort 160 Take 2 puffs first thing in am and then another 2 puffs about 12 hours later.  Work on inhaler technique:   Plan B = Backup Only use your albuterol as a rescue medication Plan C = Crisis - only use your albuterol/ipatropium  nebulizer if you first try Plan B For cough >  mucinex dm 1200 mg every 12 hours and use the flutter valve as needed  Stop losartan and start bystolic 10 mg daily until return  Pantoprazole (protonix) 40 mg   Take  30-60 min before first meal of the day and Pepcid (famotidine)  20 mg one @  bedtime until return to office - this is the best way to tell whether stomach acid is contributing to your problem.   GERD  Diet .    11/09/2017  f/u ov/Jaedah Lords re:  Copd/ ab with refractory cough since april 2019  / bp/pulse  recheck on bystolic 10  Chief Complaint  Patient presents with  . Follow-up    Cough has SOB improved and then worsened again approx 1 wk after the last visit. He has not had to use nebs.   Dyspnea:  Was able to do  [employer] not now Cough: worst first thing in am /light green in am / says using flutter but still feeling urge to force a cough daytime  Sleep: no with breathing problems or noct disturbance due to cough  SABA use:  Has not used it much  rec When you finish the bystolic switch over to bisprolol 5 mg one tablet daily  Work on maintaining perferct  inhaler technique: Prednisone 10 mg take  4 each am x 2 days,   2 each am x 2 days,  1 each am x 2 days and stop  Doxycline 100 mg twice daily x 10 days  schedule  Sinus  CT  neg Add: did not go to lab/ can wait until next ov     12/21/2017  f/u ov/Kaivon Livesey re:  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 Follow-up    PFT's done today. Pt states breathing seems worse since the last visit. He is using his albuterol inhaler 2 x daily on average and rarely uses neb.   Dyspnea:  Worse since April  2019  Cough:  Worse since mowed grass 12/18/17  SABA OZH:[id number] quite a bit better p saba despite symb adherence at 2 puffs q 12 / always better with pred also 02: none   rec Prednisone 10 mg  X 2 each am until better then one daily until return  Plan A = Automatic = symbicort 160x 2 and spiriva 2 pffs each am / symbicort 160 x 2  Work on inhaler technique:  Plan B = Backup Only use your albuterol as a rescue medication Plan C = Crisis - only use your albuterol nebulizer if you first try Plan B and it fails to help > ok to use the nebulizer up to every 4 hours but if start needing it regularly call for immediate appointment    01/22/2018  f/u ov/Chadwick Reiswig re:  GOLD IV  Copd / maint on sym/spiriva and prednisone  Chief Complaint  Patient presents with  . Follow-up    Breathing has been worse the past few days.  He is wheezing some and coughing with yellow sputum. He is using his albuterol inhaler 2 x daily and neb once per wk on average. He has been having some swelling in his ankles since he started amlodipine.   Dyspnea:  Push cart all day around [employer] slow pace = MMRC2 = can't walk a nl pace on a flat grade s sob but does fine slow and flat but breathing worse since mowed grass sev days prior to OV   Cough: daytime cough/ using flatter valve but not sure it's working properly, did not bring it  SABA use: sev times daily helps some 02:  No  rec Put blocks under the head of your bed  Daliresp 500 mg every other day for a month then 500 mg daily   If breathing worse and need nebulizer >  Prednisone 10 mg 2 daily until better then one daily thereafter For cough/ congestion > mucinex dm up to 1200 mg every 12 hours and use the flutter valve as much as possible  We will be referring you to pulmonary rehab and allergy  - consider hrct next to look for bronchiectasis     03/18/2018  f/u ov/Johnice Riebe re: GOLD IV  symbicort /  No longer spiriva/ prednisone ? deliresp  Chief Complaint  Patient  presents with  . Follow-up    Breathing has improved some  since the last visit. He is using his albuterol inhaler 2 x daily and neb with albuterol 2 x wkly on average.   Dyspnea:  50 ft  = MMRC3 = can't walk 100 yards even at a slow pace at a flat grade s stopping due to sob   Cough: is better on dalirest put still has sensation throat clogged up from drainage worse since finished prednisone "ran out" but producing minimal mucoid sputum at this point Sleeping: can't tolerate bed elevation with bed blocks though was using large cinder blocks previously  SABA use: twice daily hfa / twice a week neb  02: no   Very poor insight into med names, can't keep up with refills    No obvious day to day or daytime variability or assoc  purulent sputum or mucus plugs or hemoptysis or cp or chest tightness, subjective wheeze or overt sinus or hb symptoms.   Also denies any obvious fluctuation of symptoms with weather or environmental changes or other aggravating or alleviating factors except as outlined above   No unusual exposure hx or h/o childhood pna/ asthma or knowledge of premature birth.  Current Allergies, Complete Past Medical History, Past Surgical History, Family History, and Social History were reviewed in [employer] record.  ROS  The following are not active complaints unless bolded Hoarseness, sore throat, dysphagia, dental problems, itching, sneezing,  nasal congestion or discharge of excess mucus or purulent secretions, ear ache,   fever, chills, sweats, unintended wt loss or wt gain, classically pleuritic or exertional cp,  orthopnea pnd or arm/hand swelling  or leg swelling, presyncope, palpitations, abdominal pain, anorexia, nausea, vomiting, diarrhea  or change in bowel habits or change in bladder habits, change in stools or change in urine, dysuria, hematuria,  rash, arthralgias, visual complaints, headache, numbness, weakness or ataxia or problems with walking or  coordination,  change in mood or  memory.        Current Meds  Medication Sig  . albuterol (PROVENTIL) (2.5 MG/3ML) 0.083% nebulizer solution Take 3 mLs (2.5 mg total) by nebulization every 6 (six) hours as needed for wheezing or shortness of breath.  Marland Kitchen amLODipine (NORVASC) 10 MG tablet TAKE 1 TABLET BY MOUTH ONCE DAILY.  Marland Kitchen azelastine (ASTELIN) 0.1 % nasal spray Place 2 sprays into both nostrils 2 (two) times daily.  . bisoprolol (ZEBETA) 5 MG tablet Take 1 tablet (5 mg total) by mouth daily.  Marland Kitchen dextromethorphan-guaiFENesin (MUCINEX DM) 30-600 MG 12hr tablet Take 1 tablet by mouth 2 (two) times daily.  . famotidine (PEPCID) 20 MG tablet One at bedtime  . ibuprofen (ADVIL,MOTRIN) 200 MG tablet Take 200 mg by mouth every 6 (six) hours as needed.  . montelukast (SINGULAIR) 10 MG tablet TAKE 1 TABLET BY MOUTH ONCE DAILY.  . pantoprazole (PROTONIX) 40 MG tablet TAKE ONE TABLET BY MOUTH DAILY 30 TO 60 MINUTES BEFORE FIRST MEAL OF THE DAY.  Marland Kitchen Respiratory Therapy Supplies (FLUTTER) DEVI 1 Device by Does not apply route as needed.  . roflumilast (DALIRESP) 500 MCG TABS tablet Take 1 tablet (500 mcg total) by mouth daily. (Patient taking differently: Take 500 mcg by mouth every other day. )  . triamcinolone (NASACORT) 55 MCG/ACT AERO nasal inhaler Place 2 sprays into the nose daily.  . VENTOLIN HFA 108 (90 Base) MCG/ACT inhaler INHALE 1 TO 2 PUFFS INTO LUNGS EVERY 4 HOURS AS NEEDED.  .   budesonide-formoterol (SYMBICORT) 160-4.5 MCG/ACT inhaler Take 2 puffs first thing in am and  then another 2 puffs about 12 hours later.  . [  Tiotropium Bromide Monohydrate (SPIRIVA RESPIMAT) 2.5 MCG/ACT AERS Inhale 2 puffs into the lungs daily.               Objective:   Physical Exam  amb wm congested cough    03/18/2018      201  01/22/2018      217  12/21/2017       206  11/09/2017       203   10/25/17 204 lb (92.5 kg)  10/18/17 208 lb (94.3 kg)  10/18/17 208 lb (94.3 kg)      Vital signs reviewed -  Note on arrival 02 sats  99% on RA          HEENT: full dentures/ nl oropharynx. Nl external ear canals without cough reflex -  Mild bilateral non-specific turbinate edema     NECK :  without JVD/Nodes/TM/ nl carotid upstrokes bilaterally   LUNGS: no acc muscle use,  Mild barrel  contour chest wall with bilateral  Distant bs mild insp/exp rhonchi  With  cough on  exp maneuver and mild  Hyperresonant  to  percussion bilaterally     CV:  RRR  no s3 or murmur or increase in P2, and no edema   ABD:  soft and nontender with pos mid/late  insp Hoover's  in the supine position. No bruits or organomegaly appreciated, bowel sounds nl  MS:   Nl gait/  ext warm without deformities, calf tenderness, cyanosis or clubbing No obvious joint restrictions   SKIN: warm and dry without lesions    NEURO:  alert, approp, nl sensorium with  no motor or cerebellar deficits apparent.                        Assessment:

## 2018-03-19 ENCOUNTER — Encounter: Payer: Self-pay | Admitting: Internal Medicine

## 2018-03-19 ENCOUNTER — Telehealth: Payer: Self-pay

## 2018-03-19 NOTE — Assessment & Plan Note (Addendum)
PFT's  12/21/2017  FEV1 1.04 (29 % ) ratio 39  p 15 % improvement from saba p symbicort 160 x 2  prior to study with DLCO  37 % corrects to 41 % for alv volume   - 12/21/2017  After extensive coaching inhaler device  effectiveness =    75% SMI > start spiriva smi 2.5 x 2 qam - Prednisone maint rx 12/21/2017 > better then ran out  - alpha one AT Screen   MM  191  - rec trial of dilaresp 01/22/2018  ? Improved 03/18/2018  - changed to laba/lama neb 03/18/2018 and changed pred to Plan D with floor 0    ? Some response to daliresp so may be able to control symptoms s prednisone maint rx and use it as a back up only at this point   The goal with a chronic steroid dependent illness is always arriving at the lowest effective dose that controls the disease/symptoms and not accepting a set "formula" which is based on statistics or guidelines that don't always take into account patient  variability or the natural hx of the dz in every individual patient, which may well vary over time.  For now therefore I recommend the patient maintain  Off prednisone but if worse resp symptoms rx 20 mg until better then taper completely off    rec brovana/yupelri samples then return in 4 weeks to regroup.

## 2018-03-19 NOTE — Telephone Encounter (Signed)
Received fax form [employer] that the order had been cancelled due to having an inactive order status and it has exceeded the 365 days from initial order

## 2018-03-19 NOTE — Assessment & Plan Note (Signed)
Sinus ct 11/28/2017 >>> Clear paranasal sinuses Allergy profile 12/21/2017 >  Eos 0.0 /  IgE 540 RAST pos grass and trees but basically also everything else but cats  - flare 01/22/2018 while on singulair > d/c'd  - referred to allergy 01/22/2018 >  seen by Dellis AnesGallagher 02/20/18  Has more nasal congestion off prednisone > rec rx/ f/u per Allergy rx

## 2018-03-25 DIAGNOSIS — M9903 Segmental and somatic dysfunction of lumbar region: Secondary | ICD-10-CM | POA: Diagnosis not present

## 2018-03-25 DIAGNOSIS — M545 Low back pain: Secondary | ICD-10-CM | POA: Diagnosis not present

## 2018-04-04 DIAGNOSIS — M9903 Segmental and somatic dysfunction of lumbar region: Secondary | ICD-10-CM | POA: Diagnosis not present

## 2018-04-04 DIAGNOSIS — M545 Low back pain: Secondary | ICD-10-CM | POA: Diagnosis not present

## 2018-04-11 ENCOUNTER — Telehealth: Payer: Self-pay | Admitting: Internal Medicine

## 2018-04-11 NOTE — Telephone Encounter (Signed)
He should really come in for a visit, too much to address over phone. Thanks

## 2018-04-11 NOTE — Telephone Encounter (Signed)
Pt notified of recs  He refuses appt today and prefers to wait until next ov 04/15/18  He was advised to call sooner or seek emergent care sooner if needed

## 2018-04-11 NOTE — Telephone Encounter (Signed)
Called and spoke with pt who stated he had been taking Daliresp but states it has given him diarrhea and has made him dizzy.  Pt states while singing in the choir at church, he states he was on prednisone and stated with the prednisone he had more wind while standing up.  Pt wants to know if he can be on prednisone for about a week due to that helping him more with his breathing while standing up.  Pt also wants to know if it would be okay to take the daliresp and prednisone.  Pt also states he has arthritis and wants to know if it would be okay for him to take ibuprofen with the other meds to help with that.  Beth, please advise on the above for pt. Thanks!

## 2018-04-15 ENCOUNTER — Ambulatory Visit (INDEPENDENT_AMBULATORY_CARE_PROVIDER_SITE_OTHER): Payer: Medicare Other | Admitting: Internal Medicine

## 2018-04-15 ENCOUNTER — Ambulatory Visit (INDEPENDENT_AMBULATORY_CARE_PROVIDER_SITE_OTHER)
Admission: RE | Admit: 2018-04-15 | Discharge: 2018-04-15 | Disposition: A | Discharge status: Home / Self Care | Payer: Medicare Other | Source: Ambulatory Visit | Attending: Internal Medicine | Admitting: Internal Medicine

## 2018-04-15 ENCOUNTER — Other Ambulatory Visit (INDEPENDENT_AMBULATORY_CARE_PROVIDER_SITE_OTHER): Payer: Medicare Other

## 2018-04-15 ENCOUNTER — Encounter: Payer: Self-pay | Admitting: Internal Medicine

## 2018-04-15 VITALS — BP 128/78 | HR 91 | Ht 74.0 in | Wt 198.8 lb

## 2018-04-15 DIAGNOSIS — J441 Chronic obstructive pulmonary disease with (acute) exacerbation: Secondary | ICD-10-CM

## 2018-04-15 DIAGNOSIS — R05 Cough: Secondary | ICD-10-CM | POA: Diagnosis not present

## 2018-04-15 DIAGNOSIS — I10 Essential (primary) hypertension: Secondary | ICD-10-CM

## 2018-04-15 DIAGNOSIS — M9903 Segmental and somatic dysfunction of lumbar region: Secondary | ICD-10-CM | POA: Diagnosis not present

## 2018-04-15 DIAGNOSIS — M545 Low back pain: Secondary | ICD-10-CM | POA: Diagnosis not present

## 2018-04-15 LAB — CBC WITH DIFFERENTIAL/PLATELET
BASOS PCT: 0.4 % (ref 0.0–3.0)
Basophils Absolute: 0 10*3/uL (ref 0.0–0.1)
Eosinophils Absolute: 0 10*3/uL (ref 0.0–0.7)
Eosinophils Relative: 0 % (ref 0.0–5.0)
HEMATOCRIT: 39.2 % (ref 39.0–52.0)
HEMOGLOBIN: 13.2 g/dL (ref 13.0–17.0)
LYMPHS PCT: 9.8 % — AB (ref 12.0–46.0)
Lymphs Abs: 1 10*3/uL (ref 0.7–4.0)
MCHC: 33.6 g/dL (ref 30.0–36.0)
MCV: 92.1 fl (ref 78.0–100.0)
Monocytes Absolute: 0.6 10*3/uL (ref 0.1–1.0)
Monocytes Relative: 6.2 % (ref 3.0–12.0)
Neutro Abs: 8.2 10*3/uL — ABNORMAL HIGH (ref 1.4–7.7)
Neutrophils Relative %: 83.6 % — ABNORMAL HIGH (ref 43.0–77.0)
Platelets: 269 10*3/uL (ref 150.0–400.0)
RBC: 4.25 Mil/uL (ref 4.22–5.81)
RDW: 14.3 % (ref 11.5–15.5)
WBC: 9.8 10*3/uL (ref 4.0–10.5)

## 2018-04-15 LAB — BASIC METABOLIC PANEL
BUN: 30 mg/dL — AB (ref 6–23)
CHLORIDE: 101 meq/L (ref 96–112)
CO2: 26 meq/L (ref 19–32)
Calcium: 9.2 mg/dL (ref 8.4–10.5)
Creatinine, Ser: 1.29 mg/dL (ref 0.40–1.50)
GFR: 58.43 mL/min — ABNORMAL LOW (ref >60.00)
GLUCOSE: 113 mg/dL — AB (ref 70–99)
POTASSIUM: 4.3 meq/L (ref 3.5–5.1)
Sodium: 135 mEq/L (ref 135–145)

## 2018-04-15 MED ORDER — AZITHROMYCIN 250 MG PO TABS: ORAL_TABLET | ORAL | 0 refills | Status: DC

## 2018-04-15 MED ORDER — TRIAMTERENE-HCTZ 37.5-25 MG PO TABS: ORAL_TABLET | ORAL | 2 refills | Status: AC

## 2018-04-15 NOTE — Patient Instructions (Addendum)
zpak   For cough > mucinex or mucinex dm up to 1200 mg every 12 hours and use the flutter valve   For swelling > maxzide 25 one daily as needed    Please remember to go to the lab and x-ray department downstairs in the basement  for your tests - we will call you with the results when they are available.      See Tammy NP w/in 2 weeks with all your medications, even over the counter meds, separated in two separate bags, the ones you take no matter what vs the ones you stop once you feel better and take only as needed when you feel you need them.   Tammy  will generate for you a new user friendly medication calendar that will put [place] all on the same page re: your medication use.     Without this process, it simply isn't possible to assure that we are providing  your outpatient care  with  the attention to detail we feel you deserve.   If we cannot assure that you're getting that kind of care,  then we cannot manage your problem effectively from this clinic.  Once you have seen Tammy and we are sure that we're all on the same page with your medication use she will arrange follow up with me.

## 2018-04-15 NOTE — Progress Notes (Signed)
Subjective:    Patient ID: Eric Spencer, male   DOB: 28-Feb-1946    MRN: [id number]    Brief patient profile:  30  yowm  Quit smoking 10/2016  asthma as child up until age 72  Played baseball/ wrestling in HS with sinus problems fall and spring in his 20's while in [place] better with cortisone sprays  starting age 31's and better after teeth pulled around 2009  Then 2010 moved back to [place] then next year developed seasonal cough/ sob better p abx/pred and prn saba and completely better in between upper resp symptoms  s any chronic rx and then around 2017 noted chronic doe/ worse with bending over > then more chronic cough/sob  since May 2018 despite stopping smoking and starting on trelegy and singulair and then much worse x early April 2019 while on maint rx with trelegy / singulair  so self referred to pulmonary clinic.      Brief patient profile:  10/25/2017 1st [facility] office visit/ Eric Spencer   Chief Complaint  Patient presents with  . Pulmonary Consult    Referred by [facility].  Pt c/o SOB and cough off and on for the past several years- worse over the past 2-3 wks. He c/o SOB with walking short distances. His cough is non prod. He is using his albuterol inhaler 4 x daily and neb with albuterol 3 x daily on average.   since acute symptoms coughing /sob more so than usual  Whereas 3 weeks prior to OV  Had been maintained on trelegy and singulair and rarely needing saba in any form then acutely worse and no better p pred and levaquin this time  Having severe coughing fits with min Mucus is still yellow esp in am none from nose  /has two more days levaquin  Despite severe cough sob better and Now can go up to steps better than a week .prior to OV   but still way over using saba including in waiting room today and misunderstands how/ when to use saba  Already seen in ER with tachycardia 10/18/17 with MI ruled out  Does better at hs now on 2 pillows in terms  of sob but severe hacking cough disrupts sleep rec Plan A = Automatic =  symbicort 160 Take 2 puffs first thing in am and then another 2 puffs about 12 hours later.  Work on inhaler technique:   Plan B = Backup Only use your albuterol as a rescue medication Plan C = Crisis - only use your albuterol/ipatropium  nebulizer if you first try Plan B For cough >  mucinex dm 1200 mg every 12 hours and use the flutter valve as needed  Stop losartan and start bystolic 10 mg daily until return  Pantoprazole (protonix) 40 mg   Take  30-60 min before first meal of the day and Pepcid (famotidine)  20 mg one @  bedtime until return to office - this is the best way to tell whether stomach acid is contributing to your problem.   GERD  Diet .    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 Follow-up    Cough has SOB improved and then worsened again approx 1 wk after the last visit. He has not had to use nebs.   Dyspnea:  Was able to do [employer]  not now Cough: worst first thing in am /light green in am / says using flutter but still feeling urge to force a cough daytime  Sleep: no with breathing problems or noct disturbance due to cough  SABA use:  Has not used it much  rec When you finish the bystolic switch over to bisprolol 5 mg one tablet daily  Work on maintaining perferct  inhaler technique: Prednisone 10 mg take  4 each am x 2 days,   2 each am x 2 days,  1 each am x 2 days and stop  Doxycline 100 mg twice daily x 10 days  schedule  Sinus  CT  neg Add: did not go to lab/ can wait until next ov     12/21/2017  f/u ov/Eric Spencer re:  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 Follow-up    PFT's done today. Pt states breathing seems worse since the last visit. He is using his albuterol inhaler 2 x daily on average and rarely uses neb.   Dyspnea:  Worse since April  2019  Cough:  Worse since mowed grass 12/18/17  SABA WUJ:WJXBJYNuse:reports quite a bit better p saba despite symb adherence at 2 puffs q 12 / always better with pred also 02: none   rec Prednisone 10 mg  X 2 each am until better then one daily until return  Plan A = Automatic = symbicort 160x 2 and spiriva 2 pffs each am / symbicort 160 x 2  Work on inhaler technique:  Plan B = Backup Only use your albuterol as a rescue medication Plan C = Crisis - only use your albuterol nebulizer if you first try Plan B and it fails to help > ok to use the nebulizer up to every 4 hours but if start needing it regularly call for immediate appointment    01/22/2018  f/u ov/Eric Spencer re:  GOLD IV  Copd / maint on sym/spiriva and prednisone  Chief Complaint  Patient presents with  . Follow-up    Breathing has been worse the past few days.  He is wheezing some and coughing with yellow sputum. He is using his albuterol inhaler 2 x daily and neb once per wk on average. He has been having some swelling in his ankles since he started amlodipine.   Dyspnea:  Push cart all day around [employer] slow pace = MMRC2 = can't walk a nl pace on a flat grade s sob but does fine slow and flat but breathing worse since mowed grass sev days prior to OV   Cough: daytime cough/ using flatter valve but not sure it's working properly, did not bring it  SABA use: sev times daily helps some 02:  No  rec Put blocks under the head of your bed  Daliresp 500 mg every other day for a month then 500 mg daily   If breathing worse and need nebulizer >  Prednisone 10 mg 2 daily until better then one daily thereafter For cough/ congestion > mucinex dm up to 1200 mg every 12 hours and use the flutter valve as much as possible  We will be referring you to pulmonary rehab and allergy  - consider hrct next to look for bronchiectasis     03/18/2018  f/u ov/Eric Spencer re: GOLD IV  symbicort /  No longer spiriva/ prednisone ? deliresp  Chief Complaint  Patient  presents with  . Follow-up    Breathing has improved some since  the last visit. He is using his albuterol inhaler 2 x daily and neb with albuterol 2 x wkly on average.   Dyspnea:  50 ft  = MMRC3 = can't walk 100 yards even at a slow pace at a flat grade s stopping due to sob   Cough: is better on dalirest put still has sensation throat clogged up from drainage worse since finished prednisone "ran out" but producing minimal mucoid sputum at this point Sleeping: can't tolerate bed elevation with bed blocks though was using large cinder blocks previously  SABA use: twice daily hfa / twice a week neb  rec Plan A = Automatic =  Performist(or Brovana)  twice daily and Yupelri once daily (stop symbicort)  Plan B = Backup Only use your albuterol as a rescue medication  Plan C = Crisis - only use your albuterol nebulizer if you first try Plan B and it fails to help > ok to use the nebulizer up to every 4 hours but if start needing it regularly call for immediate appointment Plan D = Deltasone If you feel like you are loosing ground > prednisone 20 mg per day until better then 10 mg per day x 5 days and stop Please see patient coordinator before you leave today  to schedule [facility] to deliver neb and meds         04/15/2018  f/u ov/Payslee Bateson re: GOLD IV COPD / brov/yupelri   And no prednisone maint Chief Complaint  Patient presents with  . Follow-up    COPD GOLD IV patient uses albuterol BID, increased SOB and cough  in every way (except singing) better until 5 day prior to OV  Then acute chills/ yellow drainage - started  on prednisone 20mg  daily but no better  Dyspnea:  More sob x 50 ft  Cough: thick mucus / no mucinex dm Sleeping: blocks  SABA use: just using daily around 4 pm and occ hs but did not increase as rec with flare  02: no   No obvious day to day or daytime variability or assoc excess/ purulent sputum or mucus plugs or hemoptysis or cp or chest tightness, subjective wheeze or overt  sinus or hb symptoms.     Also denies any obvious fluctuation of symptoms with weather or environmental changes or other aggravating or alleviating factors except as outlined above   No unusual exposure hx or h/o childhood pna/ asthma or knowledge of premature birth.  Current Allergies, Complete Past Medical History, Past Surgical History, Family History, and Social History were reviewed in [employer] record.  ROS  The following are not active complaints unless bolded Hoarseness, sore throat, dysphagia, dental problems, itching, sneezing,  nasal congestion or discharge of excess mucus or purulent secretions, ear ache,   fever, chills, sweats, unintended wt loss or wt gain, classically pleuritic or exertional cp,  orthopnea pnd or arm/hand swelling  or leg swelling, presyncope, palpitations, abdominal pain, anorexia, nausea, vomiting, diarrhea  or change in bowel habits or change in bladder habits, change in stools or change in urine, dysuria, hematuria,  rash, arthralgias, visual complaints, headache, numbness, weakness or ataxia or problems with walking or coordination,  change in mood or  memory.        Current Meds  Medication Sig  . albuterol (PROVENTIL) (2.5 MG/3ML) 0.083% nebulizer solution Take 3 mLs (2.5 mg total) by nebulization every 6 (six) hours as needed for wheezing or shortness of breath.  Marland Kitchen amLODipine (NORVASC) 10 MG tablet  TAKE 1 TABLET BY MOUTH ONCE DAILY.  Marland Kitchen arformoterol (BROVANA) 15 MCG/2ML NEBU Take 2 mLs (15 mcg total) by nebulization 2 (two) times daily.  Marland Kitchen azelastine (ASTELIN) 0.1 % nasal spray Place 2 sprays into both nostrils 2 (two) times daily.  . bisoprolol (ZEBETA) 5 MG tablet Take 1 tablet (5 mg total) by mouth daily.  Marland Kitchen dextromethorphan-guaiFENesin (MUCINEX DM) 30-600 MG 12hr tablet Take 1 tablet by mouth 2 (two) times daily.  . famotidine (PEPCID) 20 MG tablet One at bedtime  . ibuprofen (ADVIL,MOTRIN) 200 MG tablet Take 200 mg by mouth  every 6 (six) hours as needed.  . montelukast (SINGULAIR) 10 MG tablet TAKE 1 TABLET BY MOUTH ONCE DAILY.  . pantoprazole (PROTONIX) 40 MG tablet TAKE ONE TABLET BY MOUTH DAILY 30 TO 60 MINUTES BEFORE FIRST MEAL OF THE DAY.  Marland Kitchen predniSONE (DELTASONE) 10 MG tablet Take as directed  . Respiratory Therapy Supplies (FLUTTER) DEVI 1 Device by Does not apply route as needed.  . Revefenacin (YUPELRI) 175 MCG/3ML SOLN Inhale 3 mLs into the lungs daily.  . roflumilast (DALIRESP) 500 MCG TABS tablet Take 1 tablet (500 mcg total) by mouth daily. (Patient taking differently: Take 500 mcg by mouth every other day. )  . triamcinolone (NASACORT) 55 MCG/ACT AERO nasal inhaler Place 2 sprays into the nose daily.  . VENTOLIN HFA 108 (90 Base) MCG/ACT inhaler INHALE 1 TO 2 PUFFS INTO LUNGS EVERY 4 HOURS AS NEEDED.                  Objective:   Physical Exam  amb wm congested cough    03/18/2018      201  01/22/2018      217  12/21/2017       206  11/09/2017       203   10/25/17 204 lb (92.5 kg)  10/18/17 208 lb (94.3 kg)  10/18/17 208 lb (94.3 kg)      Vital signs reviewed - Note on arrival 02 sats  99% on RA       HEENT:full dentures/ nl oropharynx. Nl external ear canals without cough reflex -  Mod bilateral non-specific turbinate edema     NECK :  without JVD/Nodes/TM/ nl carotid upstrokes bilaterally   LUNGS: no acc muscle use,  Mild barrel  contour chest wall with insp/exp junky rhonchi  s audible wheeze and  without cough on insp or exp maneuver and mild  Hyperresonant  to  percussion bilaterally     CV:  RRR  no s3 or murmur or increase in P2, and  1+ pitting both feet  ABD:  soft and nontender with pos mid  insp Hoover's  in the supine position. No bruits or organomegaly appreciated, bowel sounds nl  MS:   Nl gait/  ext warm without deformities, calf tenderness, cyanosis or clubbing No obvious joint restrictions   SKIN: warm and dry without lesions    NEURO:  alert, approp,  nl sensorium with  no motor or cerebellar deficits apparent.          CXR PA and Lateral:   04/15/2018 :    I personally reviewed images and agree with radiology impression as follows:   Possible bronchitis.  No consolidation or collapse.   Labs ordered/ reviewed:      Chemistry      Component Value Date/Time   NA 135 04/15/2018 1110   K 4.3 04/15/2018 1110   CL 101 04/15/2018 1110   CO2  26 04/15/2018 1110   BUN 30 (H) 04/15/2018 1110   CREATININE 1.29 04/15/2018 1110   CREATININE 1.10 05/23/2017 1240      Component Value Date/Time   CALCIUM 9.2 04/15/2018 1110                             Lab Results  Component Value Date   WBC 9.8 04/15/2018   HGB 13.2 04/15/2018   HCT 39.2 04/15/2018   MCV 92.1 04/15/2018   PLT 269.0 04/15/2018       EOS                                                               0                                        04/15/2018               Assessment:

## 2018-04-16 ENCOUNTER — Telehealth: Payer: Self-pay | Admitting: Internal Medicine

## 2018-04-16 NOTE — Progress Notes (Signed)
ATC, NA 

## 2018-04-16 NOTE — Telephone Encounter (Signed)
Notes recorded by Christen Butter, CMA on 04/16/2018 at 9:38 AM EDT ATC, NA ------  Notes recorded by Nyoka Cowden, MD on 04/16/2018 at 4:54 AM EDT Call pt: Reviewed cxr and no acute change so no change in recommendations made at [facility] and spoke with pt letting him know the results of the cxr. Pt expressed understanding. Nothing further needed.

## 2018-04-17 ENCOUNTER — Encounter: Payer: Self-pay | Admitting: Internal Medicine

## 2018-04-17 NOTE — Assessment & Plan Note (Signed)
Changed arb to bystolic 10/25/2017 due to concerns re cough and tachycardia  >>> Now developing fluid retention with nl renal fxn > add maxzide 25 prn leg swelling and recheck in 2 weeks

## 2018-04-17 NOTE — Assessment & Plan Note (Addendum)
PFT's  12/21/2017  FEV1 1.04 (29 % ) ratio 39  p 15 % improvement from saba p symbicort 160 x 2  prior to study with DLCO  37 % corrects to 41 % for alv volume   - 12/21/2017  After extensive coaching inhaler device  effectiveness =    75% SMI > start spiriva smi 2.5 x 2 qam - Prednisone maint rx 12/21/2017 > better then ran out  - alpha one AT Screen   MM  191  - rec trial of dilaresp 01/22/2018  ? Improved 03/18/2018  - changed to laba/lama neb 03/18/2018 and changed pred to Plan D with floor 0      >>> had been doing a lot better until "caught head cold" with poor insight into contingency meds   rec  Max mucinex / flutter/ zpak only for now   F/u in 2 week for med reconciliation.  To keep things simple, I have asked the patient to first separate medicines that are perceived as maintenance, that is to be taken daily "no matter what", from those medicines that are taken on only on an as-needed basis and I have given the patient examples of both, and then return to see our NP to generate a  detailed  medication calendar which should be followed until the next physician sees the patient and updates it.     I had an extended discussion with the patient reviewing all relevant studies completed to date and  lasting 15 to 20 minutes of a 25 minute visit on the following ongoing concerns:

## 2018-04-24 DIAGNOSIS — M545 Low back pain: Secondary | ICD-10-CM | POA: Diagnosis not present

## 2018-04-24 DIAGNOSIS — M9903 Segmental and somatic dysfunction of lumbar region: Secondary | ICD-10-CM | POA: Diagnosis not present

## 2018-04-29 ENCOUNTER — Encounter: Payer: Self-pay | Admitting: Adult Health

## 2018-04-29 ENCOUNTER — Ambulatory Visit (INDEPENDENT_AMBULATORY_CARE_PROVIDER_SITE_OTHER): Payer: Medicare Other | Admitting: Adult Health

## 2018-04-29 VITALS — BP 140/76 | HR 71 | Ht 74.0 in | Wt 198.2 lb

## 2018-04-29 DIAGNOSIS — J3089 Other allergic rhinitis: Secondary | ICD-10-CM

## 2018-04-29 DIAGNOSIS — J302 Other seasonal allergic rhinitis: Secondary | ICD-10-CM | POA: Diagnosis not present

## 2018-04-29 DIAGNOSIS — J449 Chronic obstructive pulmonary disease, unspecified: Secondary | ICD-10-CM | POA: Diagnosis not present

## 2018-04-29 NOTE — Assessment & Plan Note (Signed)
No perceived benefit from singulair, may stop   Plan  Patient Instructions  May stop Singulair .  Take Prednisone 10mg  daily for 5 days and 1/2 daily for 5 days and then stop .  Bring Neb meds to next visit.  Use Flutter valve Twice daily  .  Mucinex Twice daily  As needed  Cough/congestion  Saline  Nasal spray 2 puffs Twice daily   Use Nasacort Nasal 2 puffs daily As needed  Nasal congestion  Refer to Pulmonary rehab  Follow up with Dr. Sherene Sires  Or  NP in 4 weeks and As needed

## 2018-04-29 NOTE — Patient Instructions (Addendum)
May stop Singulair .  Take Prednisone 10mg  daily for 5 days and 1/2 daily for 5 days and then stop .  Bring Neb meds to next visit.  Use Flutter valve Twice daily  .  Mucinex Twice daily  As needed  Cough/congestion  Saline  Nasal spray 2 puffs Twice daily   Use Nasacort Nasal 2 puffs daily As needed  Nasal congestion  Refer to Pulmonary rehab  Follow up with Dr. Sherene Sires  Or Ruchy Wildrick NP in 4 weeks and As needed

## 2018-04-29 NOTE — Progress Notes (Signed)
@Patient  ID: Eric Spencer, male    DOB: 03/03/1946, 72 y.o.   MRN: [id number]  Chief Complaint  Patient presents with  . Follow-up    COPD     Referring provider: No ref. provider found  HPI: 71 year old male former smoker, 2018, followed for GOLD IV COPD  TEST  PFT's  12/21/2017  FEV1 1.04 (29 % ) ratio 39  p 15 % improvement from saba p symbicort 160 x 2  prior to study with DLCO  37 % corrects to 41 % for alv volume    04/29/2018 Follow up : COPD  Patient presents for a 2-week follow-up.  Last visit patient was having a slow to resolve COPD exacerbation.  He was given a Z-Pak.  And prednisone taper.  Patient says he is feeling some better.  He continues to get short of breath with minimum activity.  Very frustrated that he can no longer sing in the choir.  Patient does have a daily cough that at times is dry and sometimes he can bring up some thick mucus.  He does have some nasal congestion and drippy nose on and off.  He is been started on Singulair in the past but never felt like it is worked. Last visit chest x-ray showed bronchitic changes.Marland Kitchen CBC was essentially unremarkable.  Eosinophils were normal. Remains on Yupelri and Brovana nebs.    Allergies  Allergen Reactions  . Penicillins     Immunization History  Administered Date(s) Administered  . Influenza, High Dose Seasonal PF 09/07/2017  . Pneumococcal Conjugate-13 05/08/2017    Past Medical History:  Diagnosis Date  . Arthritis   . Asthma   . COPD (chronic obstructive pulmonary disease) (HCC)   . Hypertension     Tobacco History: Social History   Tobacco Use  Smoking Status Former Smoker  . Packs/day: 1.00  . Years: 30.00  . Pack years: 30.00  . Types: Cigarettes  . Last attempt to quit: 10/27/2016  . Years since quitting: 1.5  Smokeless Tobacco Never Used   Counseling given: Not Answered   Outpatient Medications Prior to Visit  Medication Sig Dispense Refill  . albuterol (PROVENTIL) (2.5  MG/3ML) 0.083% nebulizer solution Take 3 mLs (2.5 mg total) by nebulization every 6 (six) hours as needed for wheezing or shortness of breath. 150 mL 1  . amLODipine (NORVASC) 10 MG tablet TAKE 1 TABLET BY MOUTH ONCE DAILY. 90 tablet 0  . arformoterol (BROVANA) 15 MCG/2ML NEBU Take 2 mLs (15 mcg total) by nebulization 2 (two) times daily. 120 mL 11  . azelastine (ASTELIN) 0.1 % nasal spray Place 2 sprays into both nostrils 2 (two) times daily. 30 mL 5  . bisoprolol (ZEBETA) 5 MG tablet Take 1 tablet (5 mg total) by mouth daily. 30 tablet 11  . dextromethorphan-guaiFENesin (MUCINEX DM) 30-600 MG 12hr tablet Take 1 tablet by mouth 2 (two) times daily.    . famotidine (PEPCID) 20 MG tablet One at bedtime 30 tablet 11  . ibuprofen (ADVIL,MOTRIN) 200 MG tablet Take 200 mg by mouth every 6 (six) hours as needed.    . montelukast (SINGULAIR) 10 MG tablet TAKE 1 TABLET BY MOUTH ONCE DAILY. 30 tablet 5  . pantoprazole (PROTONIX) 40 MG tablet TAKE ONE TABLET BY MOUTH DAILY 30 TO 60 MINUTES BEFORE FIRST MEAL OF THE DAY. 30 tablet 2  . predniSONE (DELTASONE) 10 MG tablet Take as directed 100 tablet 0  . Respiratory Therapy Supplies (FLUTTER) DEVI 1 Device by  Does not apply route as needed. 1 each 0  . Revefenacin (YUPELRI) 175 MCG/3ML SOLN Inhale 3 mLs into the lungs daily. 120 mL 11  . roflumilast (DALIRESP) 500 MCG TABS tablet Take 1 tablet (500 mcg total) by mouth daily. (Patient taking differently: Take 500 mcg by mouth every other day. ) 30 tablet 11  . triamcinolone (NASACORT) 55 MCG/ACT AERO nasal inhaler Place 2 sprays into the nose daily. 1 Inhaler 12  . triamterene-hydrochlorothiazide (MAXZIDE-25) 37.5-25 MG tablet One daily as needed for leg swelling 30 tablet 2  . VENTOLIN HFA 108 (90 Base) MCG/ACT inhaler INHALE 1 TO 2 PUFFS INTO LUNGS EVERY 4 HOURS AS NEEDED. 18 g 1  . azithromycin (ZITHROMAX) 250 MG tablet Take 2 on day one then 1 daily x 4 days (Patient not taking: Reported on 04/29/2018) 6  tablet 0   No facility-administered medications prior to visit.      Review of Systems  Constitutional:   No  weight loss, night sweats,  Fevers, chills,  +fatigue, or  lassitude.  HEENT:   No headaches,  Difficulty swallowing,  Tooth/dental problems, or  Sore throat,                No sneezing, itching, ear ache, nasal congestion, post nasal drip,   CV:  No chest pain,  Orthopnea, PND, swelling in lower extremities, anasarca, dizziness, palpitations, syncope.   GI  No heartburn, indigestion, abdominal pain, nausea, vomiting, diarrhea, change in bowel habits, loss of appetite, bloody stools.   Resp:  .  No chest wall deformity  Skin: no rash or lesions.  GU: no dysuria, change in color of urine, no urgency or frequency.  No flank pain, no hematuria   MS:  No joint pain or swelling.  No decreased range of motion.  No back pain.    Physical Exam  BP 140/76 (BP Location: Left Arm, Cuff Size: Normal)   Pulse 71   Ht 6\' 2"  (1.88 m)   Wt 198 lb 3.2 oz (89.9 kg)   SpO2 98%   BMI 25.45 kg/m   GEN: A/Ox3; pleasant , NAD, thin elderly and frail,    HEENT:  [place]/AT,  EACs-clear, TMs-wnl, NOSE-clear, THROAT-clear, no lesions, no postnasal drip or exudate noted.   NECK:  Supple w/ fair ROM; no JVD; normal carotid impulses w/o bruits; no thyromegaly or nodules palpated; no lymphadenopathy.    RESP scattered rhonchi . no accessory muscle use, no dullness to percussion  CARD:  RRR, no m/r/g, no peripheral edema, pulses intact, no cyanosis or clubbing.  GI:   Soft & nt; nml bowel sounds; no organomegaly or masses detected.   Musco: Warm bil, no deformities or joint swelling noted.   Neuro: alert, no focal deficits noted.    Skin: Warm,  No rash     Lab Results:  CBC    Component Value Date/Time   WBC 9.8 04/15/2018 1110   RBC 4.25 04/15/2018 1110   HGB 13.2 04/15/2018 1110   HCT 39.2 04/15/2018 1110   PLT 269.0 04/15/2018 1110   MCV 92.1 04/15/2018 1110   MCH 30.4  10/18/2017 1347   MCHC 33.6 04/15/2018 1110   RDW 14.3 04/15/2018 1110   LYMPHSABS 1.0 04/15/2018 1110   MONOABS 0.6 04/15/2018 1110   EOSABS 0.0 04/15/2018 1110   BASOSABS 0.0 04/15/2018 1110    BMET    Component Value Date/Time   NA 135 04/15/2018 1110   K 4.3 04/15/2018 1110   CL  101 04/15/2018 1110   CO2 26 04/15/2018 1110   GLUCOSE 113 (H) 04/15/2018 1110   BUN 30 (H) 04/15/2018 1110   CREATININE 1.29 04/15/2018 1110   CREATININE 1.10 05/23/2017 1240   CALCIUM 9.2 04/15/2018 1110   GFRNONAA 47 (L) 10/18/2017 1347   GFRNONAA 68 05/23/2017 1240   GFRAA 54 (L) 10/18/2017 1347   GFRAA 79 05/23/2017 1240    BNP No results found for: BNP  ProBNP No results found for: PROBNP  Imaging: Dg Chest 2 View  Result Date: 04/15/2018 CLINICAL DATA:  Cough and chest congestion over the last 6 days. EXAM: CHEST - 2 VIEW COMPARISON:  10/18/2017 FINDINGS: Heart size is normal. There is aortic atherosclerosis. There is no pulmonary infiltrate, collapse or effusion. There is a pattern of bronchial prominence which could go along with bronchitis. No acute or significant bone finding. IMPRESSION: Possible bronchitis.  No consolidation or collapse. Electronically Signed   By: Paulina Fusi M.D.   On: 04/15/2018 16:13      PFT Results Latest Ref Rng & Units 12/21/2017  FVC-Pre L 2.48  FVC-Predicted Pre % 52  FVC-Post L 2.67  FVC-Predicted Post % 56  Pre FEV1/FVC % % 36  Post FEV1/FCV % % 39  FEV1-Pre L 0.90  FEV1-Predicted Pre % 25  DLCO UNC% % 37  DLCO COR %Predicted % 41  TLC L 8.76  TLC % Predicted % 117  RV % Predicted % 211    No results found for: NITRICOXIDE      Assessment & Plan:   COPD GOLD III  Recent flare now resolving . Pt is quite frail  Would benefit from pulmonary rehab .  Add flutter  Taper off prednisone .   Plan  Patient Instructions  May stop Singulair .  Take Prednisone 10mg  daily for 5 days and 1/2 daily for 5 days and then stop .  Bring Neb  meds to next visit.  Use Flutter valve Twice daily  .  Mucinex Twice daily  As needed  Cough/congestion  Saline  Nasal spray 2 puffs Twice daily   Use Nasacort Nasal 2 puffs daily As needed  Nasal congestion  Refer to Pulmonary rehab  Follow up with Dr. Sherene Sires  Or Bettylou Frew NP in 4 weeks and As needed       Seasonal and perennial allergic rhinitis No perceived benefit from singulair, may stop   Plan  Patient Instructions  May stop Singulair .  Take Prednisone 10mg  daily for 5 days and 1/2 daily for 5 days and then stop .  Bring Neb meds to next visit.  Use Flutter valve Twice daily  .  Mucinex Twice daily  As needed  Cough/congestion  Saline  Nasal spray 2 puffs Twice daily   Use Nasacort Nasal 2 puffs daily As needed  Nasal congestion  Refer to Pulmonary rehab  Follow up with Dr. Sherene Sires  Or Sheenah Dimitroff NP in 4 weeks and As needed          Rubye Oaks, NP 04/29/2018

## 2018-04-29 NOTE — Assessment & Plan Note (Addendum)
Recent flare now resolving . Pt is quite frail  Would benefit from pulmonary rehab .  Add flutter  Taper off prednisone .   Plan  Patient Instructions  May stop Singulair .  Take Prednisone 10mg  daily for 5 days and 1/2 daily for 5 days and then stop .  Bring Neb meds to next visit.  Use Flutter valve Twice daily  .  Mucinex Twice daily  As needed  Cough/congestion  Saline  Nasal spray 2 puffs Twice daily   Use Nasacort Nasal 2 puffs daily As needed  Nasal congestion  Refer to Pulmonary rehab  Follow up with Dr. Sherene Spencer  Or Parrett NP in 4 weeks and As needed

## 2018-05-07 ENCOUNTER — Other Ambulatory Visit: Payer: Self-pay | Admitting: Internal Medicine

## 2018-05-08 ENCOUNTER — Ambulatory Visit (INDEPENDENT_AMBULATORY_CARE_PROVIDER_SITE_OTHER): Payer: Medicare Other | Admitting: Podiatry

## 2018-05-08 ENCOUNTER — Encounter: Payer: Self-pay | Admitting: Podiatry

## 2018-05-08 ENCOUNTER — Ambulatory Visit (INDEPENDENT_AMBULATORY_CARE_PROVIDER_SITE_OTHER): Payer: Medicare Other

## 2018-05-08 VITALS — BP 130/76 | HR 92 | Resp 16

## 2018-05-08 DIAGNOSIS — M2042 Other hammer toe(s) (acquired), left foot: Secondary | ICD-10-CM

## 2018-05-08 DIAGNOSIS — M79674 Pain in right toe(s): Secondary | ICD-10-CM | POA: Diagnosis not present

## 2018-05-08 DIAGNOSIS — G629 Polyneuropathy, unspecified: Secondary | ICD-10-CM | POA: Diagnosis not present

## 2018-05-08 DIAGNOSIS — B351 Tinea unguium: Secondary | ICD-10-CM

## 2018-05-08 DIAGNOSIS — M2041 Other hammer toe(s) (acquired), right foot: Secondary | ICD-10-CM

## 2018-05-08 DIAGNOSIS — M79675 Pain in left toe(s): Secondary | ICD-10-CM

## 2018-05-08 MED ORDER — GABAPENTIN 300 MG PO CAPS: 300.0000 mg | ORAL_CAPSULE | Freq: Three times a day (TID) | ORAL | 3 refills | Status: DC

## 2018-05-08 NOTE — Progress Notes (Signed)
   Subjective:    Patient ID: Eric Spencer, male    DOB: 02-04-1946, 72 y.o.   MRN: [id number]  HPI    Review of Systems  Respiratory: Positive for shortness of breath and wheezing.   All other systems reviewed and are negative.      Objective:   Physical Exam        Assessment & Plan:

## 2018-05-08 NOTE — Patient Instructions (Signed)
Hammer Toe Hammer toe is a change in the shape (a deformity) of your second, third, or fourth toe. The deformity causes the middle joint of your toe to stay bent. This causes pain, especially when you are wearing shoes. Hammer toe starts gradually. At first, the toe can be straightened. Gradually over time, the deformity becomes stiff and permanent. Early treatments to keep the toe straight may relieve pain. As the deformity becomes stiff and permanent, surgery may be needed to straighten the toe. What are the causes? Hammer toe is caused by abnormal bending of the toe joint that is closest to your foot. It happens gradually over time. This pulls on the muscles and connections (tendons) of the toe joint, making them weak and stiff. It is often related to wearing shoes that are too short or narrow and do not let your toes straighten. What increases the risk? You may be at greater risk for hammer toe if you:  Are male.  Are older.  Wear shoes that are too small.  Wear high-heeled shoes that pinch your toes.  Are a ballet dancer.  Have a second toe that is longer than your big toe (first toe).  Injure your foot or toe.  Have arthritis.  Have a family history of hammer toe.  Have a nerve or muscle disorder.  What are the signs or symptoms? The main symptoms of this condition are pain and deformity of the toe. The pain is worse when wearing shoes, walking, or running. Other symptoms may include:  Corns or calluses over the bent part of the toe or between the toes.  Redness and a burning feeling on the toe.  An open sore that forms on the top of the toe.  Not being able to straighten the toe.  How is this diagnosed? This condition is diagnosed based on your symptoms and a physical exam. During the exam, your health care provider will try to straighten your toe to see how stiff the deformity is. You may also have tests, such as:  A blood test to check for rheumatoid  arthritis.  An X-ray to show how severe the deformity is.  How is this treated? Treatment for this condition will depend on how stiff the deformity is. Surgery is often needed. However, sometimes a hammer toe can be straightened without surgery. Treatments that do not involve surgery include:  Taping the toe into a straightened position.  Using pads and cushions to protect the toe (orthotics).  Wearing shoes that provide enough room for the toes.  Doing toe-stretching exercises at home.  Taking an NSAID to reduce pain and swelling.  If these treatments do not help or the toe cannot be straightened, surgery is the next option. The most common surgeries used to straighten a hammer toe include:  Arthroplasty. In this procedure, part of the joint is removed, and that allows the toe to straighten.  Fusion. In this procedure, cartilage between the two bones of the joint is taken out and the bones are fused together into one longer bone.  Implantation. In this procedure, part of the bone is removed and replaced with an implant to let the toe move again.  Flexor tendon transfer. In this procedure, the tendons that curl the toes down (flexor tendons) are repositioned.  Follow these instructions at home:  Take over-the-counter and prescription medicines only as told by your health care provider.  Do toe straightening and stretching exercises as told by your health care provider.  Keep all   follow-up visits as told by your health care provider. This is important. How is this prevented?  Wear shoes that give your toes enough room and do not cause pain.  Do not wear high-heeled shoes. Contact a health care provider if:  Your pain gets worse.  Your toe becomes red or swollen.  You develop an open sore on your toe. This information is not intended to replace advice given to you by your health care provider. Make sure you discuss any questions you have with your health care  provider. Document Released: 06/09/2000 Document Revised: 12/31/2015 Document Reviewed: 10/06/2015 Elsevier Interactive Patient Education  2018 [employer]  

## 2018-05-08 NOTE — Progress Notes (Signed)
Subjective:   Patient ID: Eric Spencer, male   DOB: 72 y.o.   MRN: [id number]   HPI Patient states is toes are starting to burn and he is concerned because he feels like they are getting rigidly contracted.  He is not developing balance issues but feels like his feet just do not feel normal to him.  States is been this way for a couple years   Review of Systems  All other systems reviewed and are negative.       Objective:  Physical Exam  Constitutional: He appears well-developed and well-nourished.  Cardiovascular: Intact distal pulses.  Pulmonary/Chest: Effort normal.  Musculoskeletal: Normal range of motion.  Neurological: He is alert.  Skin: Skin is warm.  Nursing note and vitals reviewed.   Neurovascular status found diminished sharp dull and vibratory of a mild nature with circulatory status adequate.  Patient's found to have significant rigid contracture lesser digits bilateral with mild discomfort associated with it and cavus foot structure and is noted to have no other pathology that I could identify.  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  If symptoms get worse he will be seen back and may require more advanced studies  X-rays indicate that there is no indications of advanced arthritis or stress fracture with moderate hammertoe deformity bilateral

## 2018-05-09 NOTE — Progress Notes (Signed)
Chart and office note reviewed in detail  > agree with a/p as outlined    

## 2018-05-13 DIAGNOSIS — M9903 Segmental and somatic dysfunction of lumbar region: Secondary | ICD-10-CM | POA: Diagnosis not present

## 2018-05-13 DIAGNOSIS — M545 Low back pain: Secondary | ICD-10-CM | POA: Diagnosis not present

## 2018-05-24 ENCOUNTER — Other Ambulatory Visit: Payer: Self-pay | Admitting: Internal Medicine

## 2018-05-24 DIAGNOSIS — J439 Emphysema, unspecified: Secondary | ICD-10-CM

## 2018-05-27 ENCOUNTER — Encounter: Payer: Self-pay | Admitting: Internal Medicine

## 2018-05-27 ENCOUNTER — Ambulatory Visit (INDEPENDENT_AMBULATORY_CARE_PROVIDER_SITE_OTHER): Payer: Medicare Other | Admitting: Internal Medicine

## 2018-05-27 VITALS — BP 128/68 | HR 92 | Ht 74.0 in | Wt 195.0 lb

## 2018-05-27 DIAGNOSIS — J449 Chronic obstructive pulmonary disease, unspecified: Secondary | ICD-10-CM

## 2018-05-27 DIAGNOSIS — R05 Cough: Secondary | ICD-10-CM | POA: Diagnosis not present

## 2018-05-27 DIAGNOSIS — R059 Cough, unspecified: Secondary | ICD-10-CM

## 2018-05-27 DIAGNOSIS — J439 Emphysema, unspecified: Secondary | ICD-10-CM

## 2018-05-27 DIAGNOSIS — R0609 Other forms of dyspnea: Secondary | ICD-10-CM

## 2018-05-27 MED ORDER — VENTOLIN HFA 108 (90 BASE) MCG/ACT IN AERS: INHALATION_SPRAY | RESPIRATORY_TRACT | 2 refills | Status: DC

## 2018-05-27 NOTE — Patient Instructions (Addendum)
Only use your albuterol inhaler as a rescue medication to be used if you can't catch your breath by resting or doing a relaxed purse lip breathing pattern.  - The less you use it, the better it will work when you need it. - Ok to use up to 2 puffs  every 4 hours if you must but call for immediate appointment if use goes up over your usual need - Don't leave home without it !!  (think of it like the spare tire for your car)  Work on perfecting your inhaler technique:  relax and gently blow all the way out then take a nice smooth deep breath back in, triggering the inhaler at same time you start breathing in.  Hold for up to 5 seconds if you can.  Rinse and gargle with water when done    If you feel like you are loosing ground > prednisone 20 mg per day until better then 10 mg per day x 5 days and stop  Please schedule a follow up visit in 3 months but call sooner if needed

## 2018-05-27 NOTE — Progress Notes (Signed)
Subjective:    Patient ID: Eric Spencer, male   DOB: 28-Feb-1946    MRN: [id number]    Brief patient profile:  72  yowm  Quit smoking 10/2016  asthma as child up until age 72  Played baseball/ wrestling in HS with sinus problems fall and spring in his 20's while in [place] better with cortisone sprays  starting age 72's and better after teeth pulled around 2009  Then 2010 moved back to [place] then next year developed seasonal cough/ sob better p abx/pred and prn saba and completely better in between upper resp symptoms  s any chronic rx and then around 2017 noted chronic doe/ worse with bending over > then more chronic cough/sob  since May 2018 despite stopping smoking and starting on trelegy and singulair and then much worse x early April 2019 while on maint rx with trelegy / singulair  so self referred to pulmonary clinic.      Brief patient profile:  10/25/2017 1st [facility] office visit/ Eric Spencer   Chief Complaint  Patient presents with  . Pulmonary Consult    Referred by [facility].  Pt c/o SOB and cough off and on for the past several years- worse over the past 2-3 wks. He c/o SOB with walking short distances. His cough is non prod. He is using his albuterol inhaler 4 x daily and neb with albuterol 3 x daily on average.   since acute symptoms coughing /sob more so than usual  Whereas 3 weeks prior to OV  Had been maintained on trelegy and singulair and rarely needing saba in any form then acutely worse and no better p pred and levaquin this time  Having severe coughing fits with min Mucus is still yellow esp in am none from nose  /has two more days levaquin  Despite severe cough sob better and Now can go up to steps better than a week .prior to OV   but still way over using saba including in waiting room today and misunderstands how/ when to use saba  Already seen in ER with tachycardia 10/18/17 with MI ruled out  Does better at hs now on 2 pillows in terms  of sob but severe hacking cough disrupts sleep rec Plan A = Automatic =  symbicort 160 Take 2 puffs first thing in am and then another 2 puffs about 12 hours later.  Work on inhaler technique:   Plan B = Backup Only use your albuterol as a rescue medication Plan C = Crisis - only use your albuterol/ipatropium  nebulizer if you first try Plan B For cough >  mucinex dm 1200 mg every 12 hours and use the flutter valve as needed  Stop losartan and start bystolic 10 mg daily until return  Pantoprazole (protonix) 40 mg   Take  30-60 min before first meal of the day and Pepcid (famotidine)  20 mg one @  bedtime until return to office - this is the best way to tell whether stomach acid is contributing to your problem.   GERD  Diet .    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 Follow-up    Cough has SOB improved and then worsened again approx 1 wk after the last visit. He has not had to use nebs.   Dyspnea:  Was able to do [employer]  not now Cough: worst first thing in am /light green in am / says using flutter but still feeling urge to force a cough daytime  Sleep: no with breathing problems or noct disturbance due to cough  SABA use:  Has not used it much  rec When you finish the bystolic switch over to bisprolol 5 mg one tablet daily  Work on maintaining perferct  inhaler technique: Prednisone 10 mg take  4 each am x 2 days,   2 each am x 2 days,  1 each am x 2 days and stop  Doxycline 100 mg twice daily x 10 days  schedule  Sinus  CT  neg Add: did not go to lab/ can wait until next ov     12/21/2017  f/u ov/Eric Spencer re:  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 Follow-up    PFT's done today. Pt states breathing seems worse since the last visit. He is using his albuterol inhaler 2 x daily on average and rarely uses neb.   Dyspnea:  Worse since April  2019  Cough:  Worse since mowed grass 12/18/17  SABA WUJ:WJXBJYNuse:reports quite a bit better p saba despite symb adherence at 2 puffs q 12 / always better with pred also 02: none   rec Prednisone 10 mg  X 2 each am until better then one daily until return  Plan A = Automatic = symbicort 160x 2 and spiriva 2 pffs each am / symbicort 160 x 2  Work on inhaler technique:  Plan B = Backup Only use your albuterol as a rescue medication Plan C = Crisis - only use your albuterol nebulizer if you first try Plan B and it fails to help > ok to use the nebulizer up to every 4 hours but if start needing it regularly call for immediate appointment    01/22/2018  f/u ov/Eric Spencer re:  GOLD IV  Copd / maint on sym/spiriva and prednisone  Chief Complaint  Patient presents with  . Follow-up    Breathing has been worse the past few days.  He is wheezing some and coughing with yellow sputum. He is using his albuterol inhaler 2 x daily and neb once per wk on average. He has been having some swelling in his ankles since he started amlodipine.   Dyspnea:  Push cart all day around [employer] slow pace = MMRC2 = can't walk a nl pace on a flat grade s sob but does fine slow and flat but breathing worse since mowed grass sev days prior to OV   Cough: daytime cough/ using flatter valve but not sure it's working properly, did not bring it  SABA use: sev times daily helps some 02:  No  rec Put blocks under the head of your bed  Daliresp 500 mg every other day for a month then 500 mg daily   If breathing worse and need nebulizer >  Prednisone 10 mg 2 daily until better then one daily thereafter For cough/ congestion > mucinex dm up to 1200 mg every 12 hours and use the flutter valve as much as possible  We will be referring you to pulmonary rehab and allergy  - consider hrct next to look for bronchiectasis     03/18/2018  f/u ov/Eric Spencer re: GOLD IV  symbicort /  No longer spiriva/ prednisone ? deliresp  Chief Complaint  Patient  presents with  . Follow-up    Breathing has improved some since  the last visit. He is using his albuterol inhaler 2 x daily and neb with albuterol 2 x wkly on average.   Dyspnea:  50 ft  = MMRC3 = can't walk 100 yards even at a slow pace at a flat grade s stopping due to sob   Cough: is better on dalirest put still has sensation throat clogged up from drainage worse since finished prednisone "ran out" but producing minimal mucoid sputum at this point Sleeping: can't tolerate bed elevation with bed blocks though was using large cinder blocks previously  SABA use: twice daily hfa / twice a week neb  rec Plan A = Automatic =  Performist(or Brovana)  twice daily and Yupelri once daily (stop symbicort)  Plan B = Backup Only use your albuterol as a rescue medication  Plan C = Crisis - only use your albuterol nebulizer if you first try Plan B and it fails to help > ok to use the nebulizer up to every 4 hours but if start needing it regularly call for immediate appointment Plan D = Deltasone If you feel like you are loosing ground > prednisone 20 mg per day until better then 10 mg per day x 5 days and stop Please see patient coordinator before you leave today  to schedule [facility] to deliver neb and meds         04/15/2018  f/u ov/Eric Spencer re: GOLD IV COPD / brov/yupelri   And no prednisone maint Chief Complaint  Patient presents with  . Follow-up    COPD GOLD IV patient uses albuterol BID, increased SOB and cough  in every way (except singing) better until 5 day prior to OV  Then acute chills/ yellow drainage - started  on prednisone 20mg  daily but no better  Dyspnea:  More sob x 50 ft  Cough: thick mucus / no mucinex dm Sleeping: blocks  SABA use: just using daily around 4 pm and occ hs but did not increase as rec with flare  02: no zpak  For cough > mucinex or mucinex dm up to 1200 mg every 12 hours and use the flutter valve  For swelling > maxzide 25 one daily as needed  Please remember to  go to the lab and x-ray department downstairs in the basement  for your tests - we will call you with the results when they are available.     NP rec 04/29/18  rec May stop Singulair .  Take Prednisone 10mg  daily for 5 days and 1/2 daily for 5 days and then stop .  Bring Neb meds to next visit.  Use Flutter valve Twice daily  .  Mucinex Twice daily  As needed  Cough/congestion  Saline  Nasal spray 2 puffs Twice daily   Use Nasacort Nasal 2 puffs daily As needed  Nasal congestion     05/27/2018  f/u ov/Eric Spencer re:  Copd gold iv / awaiting rehab start maint on brovana/yupelri/daliresp though did not bring meds as req  Chief Complaint  Patient presents with  . Follow-up    Breathing worse since last visit. He is using his ventolin inhaler 3 x daily on average.  He has used his albuterol neb once since his last visit.   Dyspnea:  MMRC2 = can't walk a nl pace on a flat grade s sob but does fine slow and flat  Cough: worse day than noct/ some worse with nasal congestion flares   Sleeping: bed blocks/ no 02  SABA use: sometimes wakes up  from nap  02: none/ has tank for emergencies  From fire dept/ wants supply but declines ono RA  No obvious day to day or daytime variability or assoc excess/ purulent sputum or mucus plugs or hemoptysis or cp or chest tightness, subjective wheeze or overt sinus or hb symptoms.   Sleeping as above  without nocturnal  or early am exacerbation  of respiratory  c/o's or need for noct saba. Also denies any obvious fluctuation of symptoms with weather or environmental changes or other aggravating or alleviating factors except as outlined above   No unusual exposure hx or h/o childhood pna/ asthma or knowledge of premature birth.  Current Allergies, Complete Past Medical History, Past Surgical History, Family History, and Social History were reviewed in [employer] record.  ROS  The following are not active complaints unless bolded Hoarseness,  sore throat, dysphagia, dental problems, itching, sneezing,  nasal congestion or discharge of excess mucus or purulent secretions, ear ache,   fever, chills, sweats, unintended wt loss or wt gain, classically pleuritic or exertional cp,  orthopnea pnd or arm/hand swelling  or leg swelling, presyncope, palpitations, abdominal pain, anorexia, nausea, vomiting, diarrhea  or change in bowel habits or change in bladder habits, change in stools or change in urine, dysuria, hematuria,  rash, arthralgias, visual complaints, headache, numbness, weakness or ataxia or problems with walking or coordination,  change in mood or  memory.        Current Meds  Medication Sig  . albuterol (PROVENTIL) (2.5 MG/3ML) 0.083% nebulizer solution Take 3 mLs (2.5 mg total) by nebulization every 6 (six) hours as needed for wheezing or shortness of breath.  Marland Kitchen amLODipine (NORVASC) 10 MG tablet TAKE 1 TABLET BY MOUTH ONCE DAILY.  Marland Kitchen arformoterol (BROVANA) 15 MCG/2ML NEBU Take 2 mLs (15 mcg total) by nebulization 2 (two) times daily.  Marland Kitchen azelastine (ASTELIN) 0.1 % nasal spray Place 2 sprays into both nostrils 2 (two) times daily.  . bisoprolol (ZEBETA) 5 MG tablet Take 1 tablet (5 mg total) by mouth daily.  Marland Kitchen dextromethorphan-guaiFENesin (MUCINEX DM) 30-600 MG 12hr tablet Take 1 tablet by mouth 2 (two) times daily.  . famotidine (PEPCID) 20 MG tablet One at bedtime  . gabapentin (NEURONTIN) 300 MG capsule Take 1 capsule (300 mg total) by mouth 3 (three) times daily.  Marland Kitchen ibuprofen (ADVIL,MOTRIN) 200 MG tablet Take 200 mg by mouth every 6 (six) hours as needed.  . montelukast (SINGULAIR) 10 MG tablet TAKE 1 TABLET BY MOUTH ONCE DAILY.  . pantoprazole (PROTONIX) 40 MG tablet TAKE ONE TABLET BY MOUTH DAILY 30 TO 60 MINUTES BEFORE FIRST MEAL OF THE DAY.  Marland Kitchen Respiratory Therapy Supplies (FLUTTER) DEVI 1 Device by Does not apply route as needed.  . Revefenacin (YUPELRI) 175 MCG/3ML SOLN Inhale 3 mLs into the lungs daily.  . roflumilast  (DALIRESP) 500 MCG TABS tablet Take 1 tablet (500 mcg total) by mouth daily. (Patient taking differently: Take 500 mcg by mouth every other day. )  . triamterene-hydrochlorothiazide (MAXZIDE-25) 37.5-25 MG tablet One daily as needed for leg swelling  . VENTOLIN HFA 108 (90 Base) MCG/ACT inhaler INHALE 1 TO 2 PUFFS INTO LUNGS EVERY 4 HOURS AS NEEDED.  . [DISCONTINUED] VENTOLIN HFA 108 (90 Base) MCG/ACT inhaler INHALE 1 TO 2 PUFFS INTO LUNGS EVERY 4 HOURS AS NEEDED.                         Objective:   Physical  Exam  amb wm / minimally congested sounding cough easily clears p one cough   05/27/2018       195 03/18/2018      201  01/22/2018      217  12/21/2017       206  11/09/2017       203   10/25/17 204 lb (92.5 kg)  10/18/17 208 lb (94.3 kg)  10/18/17 208 lb (94.3 kg)      Vital signs reviewed - Note on arrival 02 sats  95% on RA            HEENT: nl dentition / oropharynx. Nl external ear canals without cough reflex -  Mild bilateral non-specific turbinate edema     NECK :  without JVD/Nodes/TM/ nl carotid upstrokes bilaterally   LUNGS: no acc muscle use,  Mod barrel  contour chest wall with bilateral  Distant mid exp wheeze and  without cough on insp or exp maneuver and mod  Hyperresonant  to  percussion bilaterally     CV:  RRR  no s3 or murmur or increase in P2, and trace L > R pitting LE  edema   ABD:  soft and nontender with pos mid insp Hoover's  in the supine position. No bruits or organomegaly appreciated, bowel sounds nl  MS:   Nl gait/  ext warm without deformities, calf tenderness, cyanosis or clubbing No obvious joint restrictions   SKIN: warm and dry without lesions    NEURO:  alert, approp, nl sensorium with  no motor or cerebellar deficits apparent.             Assessment:

## 2018-05-28 ENCOUNTER — Encounter: Payer: Self-pay | Admitting: Internal Medicine

## 2018-05-28 ENCOUNTER — Ambulatory Visit: Payer: Medicare Other | Admitting: Allergy & Immunology

## 2018-05-28 DIAGNOSIS — R0609 Other forms of dyspnea: Secondary | ICD-10-CM | POA: Insufficient documentation

## 2018-05-28 NOTE — Assessment & Plan Note (Addendum)
PFT's  12/21/2017  FEV1 1.04 (29 % ) ratio 39  p 15 % improvement from saba p symbicort 160 x 2  prior to study with DLCO  37 % corrects to 41 % for alv volume   - 12/21/2017  After extensive coaching inhaler device  effectiveness =    75% SMI > start spiriva smi 2.5 x 2 qam - Prednisone maint rx 12/21/2017 > better then ran out  - alpha one AT Screen   MM  191  - rec trial of dilaresp 01/22/2018  ? Improved 03/18/2018  - changed to laba/lama neb 03/18/2018 and changed pred to Plan D with floor 0   05/27/2018  After extensive coaching inhaler device,  effectiveness =    75% (short ti)  He has  finally turned the corner on a combination of lama/ laba nebulizer plus daliresp  with no prednisone needed since his last visit so no further adjustments to his medications are necessary.  He does not qualify for ambulatory O2 based on today's study.  I did offer him a nighttime study but he declined.  Therefore if he wants to purchase oxygen on his own that is fine but there is no justification for any supplemental oxygen based on today's evaluation.  Therefore I recommended proceed with rehab when he can get it scheduled and follow-up here can be every 3 months.

## 2018-05-28 NOTE — Assessment & Plan Note (Signed)
05/27/2018   Walked RA x one lap = 210 ft - stopped due to  Sob with adequate sats nl pace  I reviewed with him the importance of doing pursed lip maneuver and patient himself better pending starting pulmonary rehab which I think with doing a lot of good.

## 2018-05-28 NOTE — Assessment & Plan Note (Signed)
Sinus ct 11/28/2017 >>> Clear paranasal sinuses Allergy profile 12/21/2017 >  Eos 0.0 /  IgE 540 RAST pos grass and trees but basically also everything else but cats  - flare 01/22/2018 while on singulair > d/c'd  - referred to allergy 01/22/2018 >  seen by Dellis AnesGallagher 02/20/18  Improved on present rx/ no changes needed   I had an extended discussion with the patient  And wife  reviewing all relevant studies completed to date and  lasting 15 to 20 minutes of a 25 minute visit  which included directly observing ambulatory 02 saturation study documented in a/p section of  today's  office note.   See device teaching which extended face to face time for this visit   Each maintenance medication was reviewed in detail including most importantly the difference between maintenance and prns and under what circumstances the prns are to be triggered using an action plan format that is not reflected in the computer generated alphabetically organized AVS.     Please see AVS for specific instructions unique to this visit that I personally wrote and verbalized to the the pt in detail and then reviewed with pt  by my nurse highlighting any changes in therapy recommended at today's visit .

## 2018-05-29 ENCOUNTER — Encounter: Payer: Self-pay | Admitting: Allergy & Immunology

## 2018-05-29 ENCOUNTER — Ambulatory Visit (INDEPENDENT_AMBULATORY_CARE_PROVIDER_SITE_OTHER): Payer: Medicare Other | Admitting: Allergy & Immunology

## 2018-05-29 VITALS — BP 130/78 | HR 88 | Resp 18

## 2018-05-29 DIAGNOSIS — J3089 Other allergic rhinitis: Secondary | ICD-10-CM

## 2018-05-29 DIAGNOSIS — J302 Other seasonal allergic rhinitis: Secondary | ICD-10-CM

## 2018-05-29 DIAGNOSIS — M9903 Segmental and somatic dysfunction of lumbar region: Secondary | ICD-10-CM | POA: Diagnosis not present

## 2018-05-29 DIAGNOSIS — M545 Low back pain: Secondary | ICD-10-CM | POA: Diagnosis not present

## 2018-05-29 DIAGNOSIS — J449 Chronic obstructive pulmonary disease, unspecified: Secondary | ICD-10-CM

## 2018-05-29 NOTE — Progress Notes (Signed)
FOLLOW UP  Date of Service/Encounter:  05/29/18   Assessment:   Seasonal and perennial allergic rhinitis (grasses, weeds, trees, Aspergillus, dog, dust mites, and roach)  Chronic obstructive pulmonary disease - on Symbicort, Spiriva, and Daliresp (followed by Dr. Sherene SiresWert)  GERD - on pantoprazole  Oral thrush  Plan/Recommendations:   1. Seasonal and perennial allergic rhinitis - Continue taking: Zyrtec (cetirizine) 10mg  tablet once daily, Nasacort (triamcinolone) one spray per nostril daily and Astelin (azelastine) 2 sprays per nostril 1-2 times daily as needed - You can use an extra dose of the antihistamine, if needed, for breakthrough symptoms.  - Consider nasal saline rinses 1-2 times daily to remove allergens from the nasal cavities as well as help with mucous clearance (this is especially helpful to do before the nasal sprays are given) - Consider allergy shots as a means of long-term control. - I did recommend that he contact Dr. Dwana MelenaZack Hall to see if he was still accepting new patients.   2. Chronic obstructive pulmonary disease - Continue to follow with Dr. Sherene SiresWert.   3. Return in about 6 months (around 11/28/2018).  Subjective:   Eric Spencer is a 72 y.o. male presenting today for follow up of  Chief Complaint  Patient presents with  . Allergic Rhinitis   . Gastroesophageal Reflux  . Cough    COPD    Eric Spencer has a history of the following: Patient Active Problem List   Diagnosis Date Noted  . DOE (dyspnea on exertion) 05/28/2018  . Seasonal and perennial allergic rhinitis 02/20/2018  . Gastroesophageal reflux disease 02/20/2018  . Cough 11/10/2017  . COPD with acute exacerbation (HCC) 10/25/2017  . COPD GOLD III  01/13/2016  . Essential hypertension 01/13/2016  . Peripheral neuropathy 01/13/2016    History obtained from: chart review and patient.  Eric Spencer Primary Care Provider is Patient, No Pcp Per.     Eric Spencer is a 72  y.o. male presenting for a follow up visit.  He was last seen in August 2019 as a new patient.  He has a history of severe COPD and is followed by Dr. Sherene SiresWert.  He came to me with blood testing that was positive to grasses, weeds, trees, Aspergillus, dog, dust mites, and roach.  We started him on cetirizine 10 mg daily as well as Nasacort and Astelin nasal sprays.  We did discuss allergy shots, but I felt this was risky given his terrible FEV1.  He did have evidence of oral thrush so we started nystatin 4 times daily.  Since the last visit, he has mostly done well. He was on Nasacort but he is out of the prescription. He remains on the azelastine nasal sprays. With the medications, he feels that his allergy symptoms are fairly well controlled. He is wondering how the spring season is going to go.   He was using 5mg  cetirizine at night which seemed to be doing well. However he has stopped it since getting on the ibuprofen over the last three days. He was recently weaned from the prednisone by Dr. Sherene SiresWert which is causing many of his previous pains to reemerge. 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   He did go to see Dr. Sherene SiresWert on 12/2 where he underwent a an O2 walking study. He did not qualify for any O2. It was recommended that he continued to pursue pulmonary rehab. He has lost a lot of weight with the initiation  of of Dalisrep.   He has not been to see his PCP since he was sent to the hospital for concern for an MI. He needs to get a new PCP and "make amends". He does tell me that he definitely need to get a PCP again.  Otherwise, there have been no changes to his past medical history, surgical history, family history, or social history.    Review of Systems: a 14-point review of systems is pertinent for what is mentioned in HPI.  Otherwise, all other systems were negative.  Constitutional: negative other than that listed in the HPI Eyes: negative other than that listed in  the HPI Ears, nose, mouth, throat, and face: negative other than that listed in the HPI Respiratory: negative other than that listed in the HPI Cardiovascular: negative other than that listed in the HPI Gastrointestinal: negative other than that listed in the HPI Genitourinary: negative other than that listed in the HPI Integument: negative other than that listed in the HPI Hematologic: negative other than that listed in the HPI Musculoskeletal: negative other than that listed in the HPI Neurological: negative other than that listed in the HPI Allergy/Immunologic: negative other than that listed in the HPI    Objective:   Blood pressure 130/78, pulse 88, resp. rate 18, SpO2 95 %. There is no height or weight on file to calculate BMI.   Physical Exam:  General: Alert, interactive, in no acute distress. Pleasant male.  Eyes: No conjunctival injection bilaterally, no discharge on the right, no discharge on the left and no Horner-Trantas dots present. PERRL bilaterally. EOMI without pain. No photophobia.  Ears: Right TM pearly gray with normal light reflex, Left TM pearly gray with normal light reflex, Right TM intact without perforation and Left TM intact without perforation.  Nose/Throat: External nose within normal limits and nasal crease present. Turbinates edematous with clear discharge. Posterior oropharynx mildly erythematous without cobblestoning in the posterior oropharynx. Tonsils 2+ without exudates.  Tongue without thrush. Lungs: Decreased breath sounds bilaterally without wheezing, rhonchi or rales. No increased work of breathing. Breathing fairly comfortably. CV: Normal S1/S2. No murmurs. Capillary refill <2 seconds.  Skin: Warm and dry, without lesions or rashes. Neuro:   Grossly intact. No focal deficits appreciated. Responsive to questions.  Diagnostic studies:   Spirometry: results abnormal (FEV1: 1.03/32%, FVC: 2.38/50%, FEV1/FVC: .43%).    Spirometry consistent with  mixed obstructive and restrictive disease but values are stable compared to the last visit.   Allergy Studies: none       Malachi Bonds, MD  [facility]

## 2018-05-29 NOTE — Patient Instructions (Addendum)
1. Seasonal and perennial allergic rhinitis - Continue taking: Zyrtec (cetirizine) 10mg  tablet once daily, Nasacort (triamcinolone) one spray per nostril daily and Astelin (azelastine) 2 sprays per nostril 1-2 times daily as needed - You can use an extra dose of the antihistamine, if needed, for breakthrough symptoms.  - Consider nasal saline rinses 1-2 times daily to remove allergens from the nasal cavities as well as help with mucous clearance (this is especially helpful to do before the nasal sprays are given) - Consider allergy shots as a means of long-term control.  2. Chronic obstructive pulmonary disease - Continue to follow with Dr. Sherene SiresWert.   3. Return in about 6 months (around 11/28/2018).  Please inform us of any Emergency Department visits, hospitalizations, or changes in symptoms. Call us before going to the ED for breathing or allergy symptoms since we might be able to fit you in for a sick visit. Feel free to contact us anytime with any questions, problems, or concerns.  It was a pleasure to see you again today!  Websites that have reliable patient information: 1. American Academy of Asthma, Allergy, and Immunology: www.aaaai.org 2. Food Allergy Research and Education (FARE): foodallergy.org 3. Mothers of Asthmatics: http://www.asthmacommunitynetwork.org 4. American College of Allergy, Asthma, and Immunology: MissingWeapons.cawww.acaai.org   Make sure you are registered to vote! If you have moved or changed any of your contact information, you will need to get this updated before voting!

## 2018-06-04 ENCOUNTER — Other Ambulatory Visit: Payer: Self-pay | Admitting: Family Medicine

## 2018-06-04 DIAGNOSIS — I10 Essential (primary) hypertension: Secondary | ICD-10-CM

## 2018-06-12 DIAGNOSIS — M545 Low back pain: Secondary | ICD-10-CM | POA: Diagnosis not present

## 2018-06-12 DIAGNOSIS — M9903 Segmental and somatic dysfunction of lumbar region: Secondary | ICD-10-CM | POA: Diagnosis not present

## 2018-06-20 ENCOUNTER — Encounter (HOSPITAL_COMMUNITY): Payer: Medicare Other

## 2018-06-27 ENCOUNTER — Encounter (HOSPITAL_COMMUNITY): Payer: Medicare Other

## 2018-06-27 DIAGNOSIS — M9903 Segmental and somatic dysfunction of lumbar region: Secondary | ICD-10-CM | POA: Diagnosis not present

## 2018-06-27 DIAGNOSIS — M545 Low back pain: Secondary | ICD-10-CM | POA: Diagnosis not present

## 2018-06-28 ENCOUNTER — Encounter (HOSPITAL_COMMUNITY): Payer: Medicare Other

## 2018-07-09 ENCOUNTER — Telehealth: Payer: Self-pay | Admitting: Internal Medicine

## 2018-07-09 NOTE — Telephone Encounter (Signed)
Spoke with pt. He is aware of MW's response. Pt wants to come here for his flu shot. He has been scheduled to come in tomorrow at 10am to have this done. Nothing further was needed.

## 2018-07-09 NOTE — Telephone Encounter (Signed)
Called and spoke with patient, he stated that he had been taking prednisone around the holiday season. He has not taken any in over a week. He wanted to see if it was ok to take the flu shot. MW please advise, thank you.

## 2018-07-09 NOTE — Telephone Encounter (Signed)
Yes that's fine 

## 2018-07-11 ENCOUNTER — Ambulatory Visit (INDEPENDENT_AMBULATORY_CARE_PROVIDER_SITE_OTHER): Payer: Medicare Other

## 2018-07-11 DIAGNOSIS — Z23 Encounter for immunization: Secondary | ICD-10-CM

## 2018-07-12 ENCOUNTER — Telehealth: Payer: Self-pay | Admitting: Internal Medicine

## 2018-07-12 NOTE — Telephone Encounter (Signed)
Called Eric Spencer, To make him aware he received the wrong injection. He was given Prevnar instead of the flu shot. I asked Eric Spencer if he had any sx, He said chills, but he has arthritis and said he gets chills with that as well. Eric Spencer is sob anyway so it's hard to tell. Told Eric Spencer if his sx get any worse please to go urgent care or ER. I he  feels like he can wait and wants to see a NP we can get him in. Sch Eric Spencer a flu for Wed. 07/17/2018.

## 2018-07-12 NOTE — Telephone Encounter (Signed)
Noted.  Dr. Sherene Sires will address when he returns to the office. Patient will need to be called and notified and determine if he she has any symptoms.

## 2018-07-12 NOTE — Telephone Encounter (Signed)
TS please document on patients advisement.

## 2018-07-12 NOTE — Telephone Encounter (Signed)
On 07/11/2018 pt was scheduled for high dose flu shot; pt was given prevnar instead. Previous prevnar had been given on 05/08/17 per immunization flow sheet in epic. Please advise on any risks to pt.  Will contact pt to insure she receives a high dose flu shot.

## 2018-07-12 NOTE — Telephone Encounter (Signed)
TP as app of the afternoon and MW out of the office please advise. Thanks .

## 2018-07-15 NOTE — Telephone Encounter (Signed)
Spoke with pt. States that he is feeling fine and does not wish to come in for an appointment. Nothing further was needed.

## 2018-07-15 NOTE — Telephone Encounter (Signed)
Call to see if feeling better if not we can add him on today to my schedule

## 2018-07-16 ENCOUNTER — Other Ambulatory Visit: Payer: Self-pay | Admitting: Internal Medicine

## 2018-07-16 DIAGNOSIS — M545 Low back pain: Secondary | ICD-10-CM | POA: Diagnosis not present

## 2018-07-16 DIAGNOSIS — M9903 Segmental and somatic dysfunction of lumbar region: Secondary | ICD-10-CM | POA: Diagnosis not present

## 2018-07-17 ENCOUNTER — Ambulatory Visit: Payer: Medicare Other

## 2018-07-17 ENCOUNTER — Ambulatory Visit: Payer: Medicare Other | Admitting: Podiatry

## 2018-07-19 ENCOUNTER — Ambulatory Visit: Payer: Medicare Other

## 2018-07-19 ENCOUNTER — Ambulatory Visit (INDEPENDENT_AMBULATORY_CARE_PROVIDER_SITE_OTHER): Payer: Medicare Other | Admitting: Adult Health

## 2018-07-19 ENCOUNTER — Encounter: Payer: Self-pay | Admitting: Adult Health

## 2018-07-19 DIAGNOSIS — J449 Chronic obstructive pulmonary disease, unspecified: Secondary | ICD-10-CM

## 2018-07-19 DIAGNOSIS — J441 Chronic obstructive pulmonary disease with (acute) exacerbation: Secondary | ICD-10-CM

## 2018-07-19 MED ORDER — AZITHROMYCIN 250 MG PO TABS: ORAL_TABLET | ORAL | 0 refills | Status: AC

## 2018-07-19 NOTE — Progress Notes (Signed)
 @Patient  ID: Eric Spencer, male    DOB: 1945/09/01, 73 y.o.   MRN: [id number]  Chief Complaint  Patient presents with  . Acute Visit    COPD     Referring provider: No ref. provider found  HPI: 73 year old male former smoker, 2018, followed for GOLD IV COPD  TEST  PFT's  12/21/2017  FEV1 1.04 (29 % ) ratio 39  p 15 % improvement from saba p symbicort 160 x 2  prior to study with DLCO  37 % corrects to 41 % for alv volume    07/19/2018 Acute OV : COPD  Patient presents for an acute office visit.  He complains over the 3-4 days he has had increased cough congestion with sinus drainage productive cough with thick white mucus.  Itchy watery eyes.  Has had some intermittent wheezing.  Has a very drippy nose with increased drainage.  He denies any chest pain fever body aches orthopnea PND or increased leg swelling. He has used some Mucinex with some benefit. Feels that symptoms started after he had his gas logs lit.  Says that he had something similar to this last year.  Says that he is very sensitive to any smells or fumes.  Only has the pilot light . Denies any headache dizziness visual changes.  He denies any nausea vomiting or diarrhea.  Does state that his appetite is been down.  Says is been down ever since starting Daliresp.  Of note patient did receive Prevnar vaccine 2 weeks ago.  Unfortunately patient was supposed to have gotten the flu vaccine but mistakenly was given Prevnar vaccine.  Denies fever, body aches , rash or chest pain.       Allergies  Allergen Reactions  . Penicillins     Immunization History  Administered Date(s) Administered  . Influenza, High Dose Seasonal PF 09/07/2017  . Pneumococcal Conjugate-13 05/08/2017, 07/11/2018    Past Medical History:  Diagnosis Date  . Arthritis   . Asthma   . COPD (chronic obstructive pulmonary disease) (HCC)   . Hypertension     Tobacco History: Social History   Tobacco Use  Smoking Status Former Smoker   . Packs/day: 1.00  . Years: 30.00  . Pack years: 30.00  . Types: Cigarettes  . Last attempt to quit: 10/28/2015  . Years since quitting: 2.7  Smokeless Tobacco Never Used   Counseling given: Not Answered   Outpatient Medications Prior to Visit  Medication Sig Dispense Refill  . albuterol (PROVENTIL) (2.5 MG/3ML) 0.083% nebulizer solution Take 3 mLs (2.5 mg total) by nebulization every 6 (six) hours as needed for wheezing or shortness of breath. 150 mL 1  . amLODipine (NORVASC) 10 MG tablet TAKE 1 TABLET BY MOUTH ONCE DAILY. 90 tablet 0  . arformoterol (BROVANA) 15 MCG/2ML NEBU Take 2 mLs (15 mcg total) by nebulization 2 (two) times daily. 120 mL 11  . bisoprolol (ZEBETA) 5 MG tablet Take 1 tablet (5 mg total) by mouth daily. 30 tablet 11  . dextromethorphan-guaiFENesin (MUCINEX DM) 30-600 MG 12hr tablet Take 1 tablet by mouth 2 (two) times daily.    . famotidine (PEPCID) 20 MG tablet One at bedtime 30 tablet 11  . gabapentin (NEURONTIN) 300 MG capsule Take 1 capsule (300 mg total) by mouth 3 (three) times daily. 90 capsule 3  . ibuprofen (ADVIL,MOTRIN) 200 MG tablet Take 200 mg by mouth every 6 (six) hours as needed.    . montelukast (SINGULAIR) 10 MG tablet TAKE 1  TABLET BY MOUTH ONCE DAILY. 30 tablet 5  . pantoprazole (PROTONIX) 40 MG tablet TAKE ONE TABLET BY MOUTH DAILY 30 TO 60 MINUTES BEFORE FIRST MEAL OF THE DAY. 30 tablet 0  . Respiratory Therapy Supplies (FLUTTER) DEVI 1 Device by Does not apply route as needed. 1 each 0  . Revefenacin (YUPELRI) 175 MCG/3ML SOLN Inhale 3 mLs into the lungs daily. 120 mL 11  . roflumilast (DALIRESP) 500 MCG TABS tablet Take 1 tablet (500 mcg total) by mouth daily. (Patient taking differently: Take 500 mcg by mouth every other day. ) 30 tablet 11  . triamcinolone (NASACORT) 55 MCG/ACT AERO nasal inhaler Place 2 sprays into the nose daily.    Marland Kitchen. triamterene-hydrochlorothiazide (MAXZIDE-25) 37.5-25 MG tablet One daily as needed for leg swelling 30  tablet 2  . VENTOLIN HFA 108 (90 Base) MCG/ACT inhaler INHALE 1 TO 2 PUFFS INTO LUNGS EVERY 4 HOURS AS NEEDED. 18 g 2  . azelastine (ASTELIN) 0.1 % nasal spray Place 2 sprays into both nostrils 2 (two) times daily. 30 mL 5   No facility-administered medications prior to visit.      Review of Systems:   Constitutional:   No  weight loss, night sweats,  Fevers, chills, fatigue, or  lassitude.  HEENT:   No headaches,  Difficulty swallowing,  Tooth/dental problems, or  Sore throat,                No sneezing, itching, ear ache,  +nasal congestion, post nasal drip,   CV:  No chest pain,  Orthopnea, PND, swelling in lower extremities, anasarca, dizziness, palpitations, syncope.   GI  No heartburn, indigestion, abdominal pain, nausea, vomiting, diarrhea, change in bowel habits, loss of appetite, bloody stools.   Resp:   No chest wall deformity  Skin: no rash or lesions.  GU: no dysuria, change in color of urine, no urgency or frequency.  No flank pain, no hematuria   MS:  No joint pain or swelling.  No decreased range of motion.  No back pain.    Physical Exam  BP 128/68 (BP Location: Left Arm, Cuff Size: Normal)   Pulse 80   Temp 98.2 F (36.8 C)   Ht 6\' 2"  (1.88 m)   Wt 195 lb 6.4 oz (88.6 kg)   SpO2 97%   BMI 25.09 kg/m   GEN: A/Ox3; pleasant , NAD, elderly , chronically ill appearing    HEENT:  [place]/AT,  EACs-clear, TMs-wnl, NOSE-clear, THROAT-clear, no lesions, no postnasal drip or exudate noted.   NECK:  Supple w/ fair ROM; no JVD; normal carotid impulses w/o bruits; no thyromegaly or nodules palpated; no lymphadenopathy.    RESP  Few trace rhonchi   no accessory muscle use, no dullness to percussion  CARD:  RRR, no m/r/g, no peripheral edema, pulses intact, no cyanosis or clubbing.  GI:   Soft & nt; nml bowel sounds; no organomegaly or masses detected.   Musco: Warm bil, no deformities or joint swelling noted.   Neuro: alert, no focal deficits noted.    Skin:  Warm, no lesions or rashes    Lab Results:  CBC  BNP No results found for: BNP  ProBNP No results found for: PROBNP  Imaging: No results found.    PFT Results Latest Ref Rng & Units 12/21/2017  FVC-Pre L 2.48  FVC-Predicted Pre % 52  FVC-Post L 2.67  FVC-Predicted Post % 56  Pre FEV1/FVC % % 36  Post FEV1/FCV % % 39  FEV1-Pre L 0.90  FEV1-Predicted Pre % 25  FEV1-Post L 1.04  DLCO UNC% % 37  DLCO COR %Predicted % 41  TLC L 8.76  TLC % Predicted % 117  RV % Predicted % 211    No results found for: NITRICOXIDE      Assessment & Plan:   COPD with acute exacerbation (HCC) Flare with URI/acute rhinitis If doing well in 7 to 10 days may go ahead and get flu shot which would be greater than 3 weeks out from recent Prevnar vaccine. Does not appear to have any known complications from a repeat Prevnar vaccine given mistakenly at immunization clinic  Plan  Patient Instructions  ZPack take as directed. -to have on hold if symptoms worsen with discolored mucus .  Use Flutter valve Twice daily  .  Mucinex Twice daily  As needed  Cough/congestion  Saline  Nasal spray 2 puffs Twice daily   Use Nasacort Nasal 2 puffs daily As needed  Nasal congestion  Zyrtec 10mg  1/2 At bedtime  For 1 week then As needed  .  Decrease Daliresp 1/2 daily .  Flu shot next week if better.  Follow up with Dr. 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  Advised that may cut Daliresp to daily  to see if this helps with side effects.     Rubye Oaks, NP 07/19/2018

## 2018-07-19 NOTE — Assessment & Plan Note (Signed)
Flare with URI/acute rhinitis If doing well in 7 to 10 days may go ahead and get flu shot which would be greater than 3 weeks out from recent Prevnar vaccine. Does not appear to have any known complications from a repeat Prevnar vaccine given mistakenly at immunization clinic  Plan  Patient Instructions  ZPack take as directed. -to have on hold if symptoms worsen with discolored mucus .  Use Flutter valve Twice daily  .  Mucinex Twice daily  As needed  Cough/congestion  Saline  Nasal spray 2 puffs Twice daily   Use Nasacort Nasal 2 puffs daily As needed  Nasal congestion  Zyrtec 10mg  1/2 At bedtime  For 1 week then As needed  .  Decrease Daliresp 1/2 daily .  Flu shot next week if better.  Follow up with Dr. Sherene Sires  Or Murlean Seelye NP in 4 weeks as planned and As needed   Please contact office for sooner follow up if symptoms do not improve or worsen or seek emergency care

## 2018-07-19 NOTE — Patient Instructions (Addendum)
ZPack take as directed. -to have on hold if symptoms worsen with discolored mucus .  Use Flutter valve Twice daily  .  Mucinex Twice daily  As needed  Cough/congestion  Saline  Nasal spray 2 puffs Twice daily   Use Nasacort Nasal 2 puffs daily As needed  Nasal congestion  Zyrtec 10mg  1/2 At bedtime  For 1 week then As needed  .  Decrease Daliresp 1/2 daily .  Flu shot next week if better.  Follow up with Dr. Sherene Sires  Or Orvill Coulthard NP in 4 weeks as planned and As needed   Please contact office for sooner follow up if symptoms do not improve or worsen or seek emergency care

## 2018-07-19 NOTE — Assessment & Plan Note (Signed)
Patient is tolerating Daliresp however has a decreased appetite.  Advised that may cut Daliresp to daily  to see if this helps with side effects.

## 2018-07-23 ENCOUNTER — Telehealth: Payer: Self-pay | Admitting: Adult Health

## 2018-07-23 NOTE — Telephone Encounter (Signed)
ATC pt, no answer and there was no option to leave a message. Will try back. 

## 2018-07-24 MED ORDER — ROFLUMILAST 250 MCG PO TABS: 250.0000 ug | ORAL_TABLET | Freq: Every day | ORAL | 11 refills | Status: AC

## 2018-07-24 NOTE — Telephone Encounter (Signed)
LMTCB x1 for pt.  

## 2018-07-24 NOTE — Telephone Encounter (Signed)
Call made to patient, confirmed pharmacy, aware new script has been sent. Voiced understanding. Nothing further is needed at this time.

## 2018-07-24 NOTE — Telephone Encounter (Signed)
That is awesome . Yes please send that dose  Daliresp 250mg  daily . #30 , 11 refills  Please check with him on what pharmacy and if 30 day or 90 day refills.   Tell so glad he is better.

## 2018-07-24 NOTE — Telephone Encounter (Signed)
Call made to patient, he states he was taking Daliresp but it makes him extremely dizzy. He states he spoke with Rubye Oaks and she suggested he try taking half. He states he has been taking daliresp 250mg  for a week and notices a huge difference and would like to stay on this dose. He states he has not been dizzy at all and wanted to know if he can stay on this dose.   TP please advise if okay to send Daliresp . Thanks.

## 2018-07-24 NOTE — Telephone Encounter (Signed)
Pt is calling back [phone]

## 2018-07-27 DIAGNOSIS — Z23 Encounter for immunization: Secondary | ICD-10-CM | POA: Diagnosis not present

## 2018-07-31 DIAGNOSIS — M545 Low back pain: Secondary | ICD-10-CM | POA: Diagnosis not present

## 2018-07-31 DIAGNOSIS — M9903 Segmental and somatic dysfunction of lumbar region: Secondary | ICD-10-CM | POA: Diagnosis not present

## 2018-08-01 ENCOUNTER — Encounter (HOSPITAL_COMMUNITY)
Admission: RE | Admit: 2018-08-01 | Discharge: 2018-08-01 | Disposition: A | Discharge status: Home / Self Care | Payer: Medicare Other | Source: Ambulatory Visit | Attending: Internal Medicine | Admitting: Internal Medicine

## 2018-08-01 ENCOUNTER — Encounter (HOSPITAL_COMMUNITY): Payer: Self-pay

## 2018-08-01 VITALS — BP 130/68 | HR 105 | Ht 74.0 in | Wt 196.2 lb

## 2018-08-01 DIAGNOSIS — J449 Chronic obstructive pulmonary disease, unspecified: Secondary | ICD-10-CM

## 2018-08-01 NOTE — Progress Notes (Signed)
Pulmonary Individual Treatment Plan  Patient Details  Name: Eric Spencer MRN: [id number] Date of Birth: 02/14/1946 Referring Provider:     PULMONARY REHAB COPD [facility]  Referring Provider  [name]      Initial Encounter Date:    PULMONARY REHAB COPD ORIENTATION [facility] 08/01/2018 [facility]  Date  08/01/18      Visit Diagnosis: COPD mixed type (HCC)  Patient's Home Medications on Admission:   Current Outpatient Medications:  .  albuterol (PROVENTIL) (2.5 MG/3ML) 0.083% nebulizer solution, Take 3 mLs (2.5 mg total) by nebulization every 6 (six) hours as needed for wheezing or shortness of breath., Disp: 150 mL, Rfl: 1 .  amLODipine (NORVASC) 10 MG tablet, TAKE 1 TABLET BY MOUTH ONCE DAILY., Disp: 90 tablet, Rfl: 0 .  arformoterol (BROVANA) 15 MCG/2ML NEBU, Take 2 mLs (15 mcg total) by nebulization 2 (two) times daily., Disp: 120 mL, Rfl: 11 .  bisoprolol (ZEBETA) 5 MG tablet, Take 1 tablet (5 mg total) by mouth daily., Disp: 30 tablet, Rfl: 11 .  dextromethorphan-guaiFENesin (MUCINEX DM) 30-600 MG 12hr tablet, Take 1 tablet by mouth 2 (two) times daily., Disp: , Rfl:  .  famotidine (PEPCID) 20 MG tablet, One at bedtime, Disp: 30 tablet, Rfl: 11 .  gabapentin (NEURONTIN) 300 MG capsule, Take 1 capsule (300 mg total) by mouth 3 (three) times daily. (Patient not taking: Reported on 08/01/2018), Disp: 90 capsule, Rfl: 3 .  ibuprofen (ADVIL,MOTRIN) 200 MG tablet, Take 200 mg by mouth every 6 (six) hours as needed., Disp: , Rfl:  .  montelukast (SINGULAIR) 10 MG tablet, TAKE 1 TABLET BY MOUTH ONCE DAILY., Disp: 30 tablet, Rfl: 5 .  pantoprazole (PROTONIX) 40 MG tablet, TAKE ONE TABLET BY MOUTH DAILY 30 TO 60 MINUTES BEFORE FIRST MEAL OF THE DAY., Disp: 30 tablet, Rfl: 0 .  Respiratory Therapy Supplies (FLUTTER) DEVI, 1 Device by Does not apply route as needed., Disp: 1 each, Rfl: 0 .  Revefenacin (YUPELRI) 175  MCG/3ML SOLN, Inhale 3 mLs into the lungs daily., Disp: 120 mL, Rfl: 11 .  Roflumilast (DALIRESP) 250 MCG TABS, Take 250 mcg by mouth daily., Disp: 30 tablet, Rfl: 11 .  roflumilast (DALIRESP) 500 MCG TABS tablet, Take 1 tablet (500 mcg total) by mouth daily. (Patient taking differently: Take 250 mcg by mouth daily. ), Disp: 30 tablet, Rfl: 11 .  triamcinolone (NASACORT) 55 MCG/ACT AERO nasal inhaler, Place 2 sprays into the nose daily., Disp: , Rfl:  .  triamterene-hydrochlorothiazide (MAXZIDE-25) 37.5-25 MG tablet, One daily as needed for leg swelling (Patient not taking: Reported on 08/01/2018), Disp: 30 tablet, Rfl: 2 .  VENTOLIN HFA 108 (90 Base) MCG/ACT inhaler, INHALE 1 TO 2 PUFFS INTO LUNGS EVERY 4 HOURS AS NEEDED., Disp: 18 g, Rfl: 2  Past Medical History: Past Medical History:  Diagnosis Date  . Arthritis   . Asthma   . COPD (chronic obstructive pulmonary disease) (HCC)   . Hypertension     Tobacco Use: Social History   Tobacco Use  Smoking Status Former Smoker  . Packs/day: 1.50  . Years: 48.00  . Pack years: 72.00  . Types: Cigarettes  . Last attempt to quit: 10/28/2015  . Years since quitting: 2.7  Smokeless Tobacco Never Used    Labs: Recent Review Advice workerlowsheet Data    Labs for [facility]P Cardiac and Pulmonary Rehab Latest Ref Rng & Units 01/13/2016   Hemoglobin A1c <5.7 % 5.3  Capillary Blood Glucose: No results found for: GLUCAP   Pulmonary Assessment Scores: Pulmonary Assessment Scores    Row Name 08/01/18 1145         ADL UCSD   ADL Phase  Entry     SOB Score total  69     Rest  0     Walk  8     Stairs  4     Bath  3     Dress  3     Shop  3       CAT Score   CAT Score  23       mMRC Score   mMRC Score  3        Pulmonary Function Assessment: Pulmonary Function Assessment - 08/01/18 1141      Pulmonary Function Tests   FVC%  52 %    FEV1%  25 %    FEV1/FVC Ratio  48    RV%  211 %    DLCO%  37 %      Initial Spirometry Results   FVC%   52 %    FEV1%  25 %    FEV1/FVC Ratio  48      Post Bronchodilator Spirometry Results   FVC%  56 %    FEV1%  29 %    FEV1/FVC Ratio  52      Breath   Shortness of Breath  Yes;Limiting activity       Exercise Target Goals: Exercise Program Goal: Individual exercise prescription set using results from initial 6 min walk test and THRR while considering  patient's activity barriers and safety.   Exercise Prescription Goal: Initial exercise prescription builds to 30-45 minutes a day of aerobic activity, 2-3 days per week.  Home exercise guidelines will be given to patient during program as part of exercise prescription that the participant will acknowledge.  Activity Barriers & Risk Stratification: Activity Barriers & Cardiac Risk Stratification - 08/01/18 0938      Activity Barriers & Cardiac Risk Stratification   Activity Barriers  Arthritis;Back Problems;Deconditioning;Shortness of Breath;Other (comment)    Comments  neuropathy in feet     Cardiac Risk Stratification  Moderate       6 Minute Walk: 6 Minute Walk    Row Name 08/01/18 0936         6 Minute Walk   Phase  Initial     Distance  700 feet     Walk Time  5.06 minutes     # of Rest Breaks  1     MPH  1.32     METS  2.02     RPE  15     Perceived Dyspnea   14     VO2 Peak  8.68     Symptoms  Yes (comment)     Comments  SOB - had to stop and put O2 on, leg tiredness due to deconditioning.      Resting HR  105 bpm     Resting BP  130/68     Resting Oxygen Saturation   98 %     Exercise Oxygen Saturation  during 6 min walk  86 % after O2 - 94     Max Ex. HR  118 bpm     Max Ex. BP  152/62     2 Minute Post BP  132/70        Oxygen Initial Assessment: Oxygen Initial Assessment - 08/01/18 1135  Home Oxygen   Home Oxygen Device  None    Sleep Oxygen Prescription  None    Home Exercise Oxygen Prescription  None    Home at Rest Exercise Oxygen Prescription  None      Initial 6 min Walk   Oxygen  Used  Continuous    Liters per minute  2      Program Oxygen Prescription   Program Oxygen Prescription  Continuous    Liters per minute  2    Comments  Patient may need if SaO2 dropps below 90%      Intervention   Short Term Goals  To learn and demonstrate proper pursed lip breathing techniques or other breathing techniques.;To learn and understand importance of maintaining oxygen saturations>88%    Long  Term Goals  Maintenance of O2 saturations>88%;Exhibits proper breathing techniques, such as pursed lip breathing or other method taught during program session       Oxygen Re-Evaluation:   Oxygen Discharge (Final Oxygen Re-Evaluation):   Initial Exercise Prescription: Initial Exercise Prescription - 08/01/18 0900      Date of Initial Exercise RX and Referring Provider   Date  08/01/18    Referring Provider  [name]    Expected Discharge Date  10/30/18      Oxygen   Oxygen  Continuous    Liters  2   as needed while walking      Treadmill   MPH  0.8    Grade  0    Minutes  17    METs  1.61      Recumbant Elliptical   Level  1    RPM  26    Watts  20    Minutes  17    METs  1      T5 Nustep   Level  1    SPM  40    Minutes  17    METs  1.5      Prescription Details   Frequency (times per week)  2    Duration  Progress to 30 minutes of continuous aerobic without signs/symptoms of physical distress      Intensity   THRR 40-80% of Max Heartrate  [phone]    Ratings of Perceived Exertion  11-13    Perceived Dyspnea  0-4      Progression   Progression  Continue to progress workloads to maintain intensity without signs/symptoms of physical distress.      Resistance Training   Training Prescription  Yes    Weight  1    Reps  10-15       Perform Capillary Blood Glucose checks as needed.  Exercise Prescription Changes:  Exercise Prescription Changes    Row Name 08/01/18 0900             Response to Exercise   Blood Pressure (Admit)  130/68        Blood Pressure (Exercise)  152/62       Blood Pressure (Exit)  132/70       Heart Rate (Admit)  105 bpm       Heart Rate (Exercise)  118 bpm       Heart Rate (Exit)  86 bpm       Oxygen Saturation (Admit)  98 %       Oxygen Saturation (Exercise)  86 %       Oxygen Saturation (Exit)  99 %       Rating of Perceived Exertion (Exercise)  15       Perceived Dyspnea (Exercise)  14       Symptoms  SOB       Comments  6 minute walk test           Exercise Comments:   Exercise Goals and Review:  Exercise Goals    Row Name 08/01/18 0941             Exercise Goals   Increase Physical Activity  Yes       Intervention  Provide advice, education, support and counseling about physical activity/exercise needs.;Develop an individualized exercise prescription for aerobic and resistive training based on initial evaluation findings, risk stratification, comorbidities and participant's personal goals.       Expected Outcomes  Short Term: Attend rehab on a regular basis to increase amount of physical activity.       Increase Strength and Stamina  Yes       Intervention  Provide advice, education, support and counseling about physical activity/exercise needs.;Develop an individualized exercise prescription for aerobic and resistive training based on initial evaluation findings, risk stratification, comorbidities and participant's personal goals.       Expected Outcomes  Short Term: Increase workloads from initial exercise prescription for resistance, speed, and METs.       Able to understand and use rate of perceived exertion (RPE) scale  Yes       Intervention  Provide education and explanation on how to use RPE scale       Expected Outcomes  Short Term: Able to use RPE daily in rehab to express subjective intensity level;Long Term:  Able to use RPE to guide intensity level when exercising independently       Able to understand and use Dyspnea scale  Yes       Intervention  Provide education and  explanation on how to use Dyspnea scale       Expected Outcomes  Long Term: Able to use Dyspnea scale to guide intensity level when exercising independently;Short Term: Able to use Dyspnea scale daily in rehab to express subjective sense of shortness of breath during exertion       Knowledge and understanding of Target Heart Rate Range (THRR)  Yes       Intervention  Provide education and explanation of THRR including how the numbers were predicted and where they are located for reference       Expected Outcomes  Short Term: Able to state/look up THRR;Short Term: Able to use daily as guideline for intensity in rehab;Long Term: Able to use THRR to govern intensity when exercising independently       Able to check pulse independently  Yes       Intervention  Provide education and demonstration on how to check pulse in carotid and radial arteries.;Review the importance of being able to check your own pulse for safety during independent exercise       Expected Outcomes  Short Term: Able to explain why pulse checking is important during independent exercise;Long Term: Able to check pulse independently and accurately       Understanding of Exercise Prescription  Yes       Intervention  Provide education, explanation, and written materials on patient's individual exercise prescription       Expected Outcomes  Short Term: Able to explain program exercise prescription;Long Term: Able to explain home exercise prescription to exercise independently          Exercise Goals Re-Evaluation :  Discharge Exercise Prescription (Final Exercise Prescription Changes): Exercise Prescription Changes - 08/01/18 0900      Response to Exercise   Blood Pressure (Admit)  130/68    Blood Pressure (Exercise)  152/62    Blood Pressure (Exit)  132/70    Heart Rate (Admit)  105 bpm    Heart Rate (Exercise)  118 bpm    Heart Rate (Exit)  86 bpm    Oxygen Saturation (Admit)  98 %    Oxygen Saturation (Exercise)  86 %     Oxygen Saturation (Exit)  99 %    Rating of Perceived Exertion (Exercise)  15    Perceived Dyspnea (Exercise)  14    Symptoms  SOB    Comments  6 minute walk test        Nutrition:  Target Goals: Understanding of nutrition guidelines, daily intake of sodium 1500mg , cholesterol 200mg , calories 30% from fat and 7% or less from saturated fats, daily to have 5 or more servings of fruits and vegetables.  Biometrics: Pre Biometrics - 08/01/18 0942      Pre Biometrics   Height  6\' 2"  (1.88 m)    Waist Circumference  39 inches    Hip Circumference  40 inches    Waist to Hip Ratio  0.98 %    Triceps Skinfold  7 mm    % Body Fat  22.7 %    Grip Strength  7.4 kg    Flexibility  0 in   cracked vertebrae    Single Leg Stand  6 seconds        Nutrition Therapy Plan and Nutrition Goals: Nutrition Therapy & Goals - 08/01/18 1148      Personal Nutrition Goals   Personal Goal #2  Patient is eatinf a regular diet. Handout was given with guidelines of heart healthy choices. Patient expresed an understaning.     Additional Goals?  No       Nutrition Assessments: Nutrition Assessments - 08/01/18 1150      MEDFICTS Scores   Pre Score  74       Nutrition Goals Re-Evaluation:   Nutrition Goals Discharge (Final Nutrition Goals Re-Evaluation):   Psychosocial: Target Goals: Acknowledge presence or absence of significant depression and/or stress, maximize coping skills, provide positive support system. Participant is able to verbalize types and ability to use techniques and skills needed for reducing stress and depression.  Initial Review & Psychosocial Screening: Initial Psych Review & Screening - 08/01/18 1147      Initial Review   Current issues with  None Identified      Family Dynamics   Good Support System?  Yes      Barriers   Psychosocial barriers to participate in program  There are no identifiable barriers or psychosocial needs.      Screening Interventions    Interventions  Encouraged to exercise    Expected Outcomes  Short Term goal: Identification and review with participant of any Quality of Life or Depression concerns found by scoring the questionnaire.;Long Term goal: The participant improves quality of Life and PHQ9 Scores as seen by post scores and/or verbalization of changes       Quality of Life Scores: Quality of Life - 08/01/18 0943      Quality of Life   Select  Quality of Life      Quality of Life Scores   Health/Function Pre  9.2 %    Socioeconomic Pre  29 %  Psych/Spiritual Pre  16.29 %    Family Pre  27.6 %    GLOBAL Pre  17.09 %      Scores of 19 and below usually indicate a poorer quality of life in these areas.  A difference of  2-3 points is a clinically meaningful difference.  A difference of 2-3 points in the total score of the Quality of Life Index has been associated with significant improvement in overall quality of life, self-image, physical symptoms, and general health in studies assessing change in quality of life.   PHQ-9: Recent Review Flowsheet Data    Depression screen [facility] 2/9 08/01/2018 10/18/2017 07/11/2017 05/23/2017 02/19/2017   Decreased Interest 0 0 0 2 0   Down, Depressed, Hopeless 0 0 0 1 2   PHQ - 2 Score 0 0 0 3 2   Altered sleeping 0 - 0 0 0   Tired, decreased energy 1 - 0 1 1   Change in appetite 2 - 0 0 0   Feeling bad or failure about yourself  0 - 0 1 1   Trouble concentrating 0 - 0 0 0   Moving slowly or fidgety/restless 0 - 0 0 1   Suicidal thoughts 0 - 0 0 0   PHQ-9 Score 3 - 0 5 5   Difficult doing work/chores Somewhat difficult - Not difficult at all Somewhat difficult Somewhat difficult     Interpretation of Total Score  Total Score Depression Severity:  1-4 = Minimal depression, 5-9 = Mild depression, 10-14 = Moderate depression, 15-19 = Moderately severe depression, 20-27 = Severe depression   Psychosocial Evaluation and Intervention: Psychosocial Evaluation - 08/01/18 1150       Psychosocial Evaluation & Interventions   Interventions  Encouraged to exercise with the program and follow exercise prescription    Continue Psychosocial Services   No Follow up required       Psychosocial Re-Evaluation:   Psychosocial Discharge (Final Psychosocial Re-Evaluation):    Education: Education Goals: Education classes will be provided on a weekly basis, covering required topics. Participant will state understanding/return demonstration of topics presented.  Learning Barriers/Preferences: Learning Barriers/Preferences - 08/01/18 1009      Learning Barriers/Preferences   Learning Barriers  None    Learning Preferences  Skilled Demonstration;Individual Instruction;Group Instruction       Education Topics: How Lungs Work and Diseases: - Discuss the anatomy of the lungs and diseases that can affect the lungs, such as COPD.   Exercise: -Discuss the importance of exercise, FITT principles of exercise, normal and abnormal responses to exercise, and how to exercise safely.   Environmental Irritants: -Discuss types of environmental irritants and how to limit exposure to environmental irritants.   Meds/Inhalers and oxygen: - Discuss respiratory medications, definition of an inhaler and oxygen, and the proper way to use an inhaler and oxygen.   Energy Saving Techniques: - Discuss methods to conserve energy and decrease shortness of breath when performing activities of daily living.    Bronchial Hygiene / Breathing Techniques: - Discuss breathing mechanics, pursed-lip breathing technique,  proper posture, effective ways to clear airways, and other functional breathing techniques   Cleaning Equipment: - Provides group verbal and written instruction about the health risks of elevated stress, cause of high stress, and healthy ways to reduce stress.   Nutrition I: Fats: - Discuss the types of cholesterol, what cholesterol does to the body, and how cholesterol  levels can be controlled.   Nutrition II: Labels: -Discuss the different  components of food labels and how to read food labels.   Respiratory Infections: - Discuss the signs and symptoms of respiratory infections, ways to prevent respiratory infections, and the importance of seeking medical treatment when having a respiratory infection.   Stress I: Signs and Symptoms: - Discuss the causes of stress, how stress may lead to anxiety and depression, and ways to limit stress.   Stress II: Relaxation: -Discuss relaxation techniques to limit stress.   Oxygen for Home/Travel: - Discuss how to prepare for travel when on oxygen and proper ways to transport and store oxygen to ensure safety.   Knowledge Questionnaire Score: Knowledge Questionnaire Score - 08/01/18 1151      Knowledge Questionnaire Score   Pre Score  14/18       Core Components/Risk Factors/Patient Goals at Admission: Personal Goals and Risk Factors at Admission - 08/01/18 1151      Core Components/Risk Factors/Patient Goals on Admission    Weight Management  Weight Maintenance    Personal Goal Other  Yes    Personal Goal  Patient wants to breathe better, get stronger, be able to sing on the choir.     Intervention  Attend PR 2 x week and supplement with at home exercise 3 x week.     Expected Outcomes  Reach personal goals       Core Components/Risk Factors/Patient Goals Review:    Core Components/Risk Factors/Patient Goals at Discharge (Final Review):    ITP Comments:   Comments: Patient arrived for 1st visit/orientation/education at 0800. Patient was referred to PR by Dr. Sherene Sires due to COPD (J44.9). During orientation advised patient on arrival and appointment times what to wear, what to do before, during and after exercise. Reviewed attendance and class policy. Talked about inclement weather and class consultation policy. Pt is scheduled to return Pulmonary Rehab on 08/06/2018 at 1330. Pt was advised to come  to class 15 minutes before class starts. Patient was also given instructions on meeting with the dietician and attending the Family Structure classes. Discussed RPE/Dpysnea scales. Discussed initial THR and how to find their radial and/or carotid pulse. Discussed the initial exercise prescription and how this effects their progress. Pt is eager to get started. Patient participated in warm up stretches followed by light weights and resistance bands. Patient was able to complete 6 minute walk test. Patient had to use O2 at 2L after starting test. He complained of leg tiredness due to peripheral neuropathy. Tiredness subsided at 2 minute rest. Tiredness was completely gone at the end of orientation. Patient was measured for the equipment. Discussed equipment safety with patient. Took patient pre-anthropometric measurements. Patient finished visit at 1015.

## 2018-08-01 NOTE — Progress Notes (Signed)
Daily Session Note  Patient Details  Name: Hervey Wedig MRN: [id number] Date of Birth: [birth year] Referring Provider:     PULMONARY REHAB COPD ORIENTATION from 08/01/2018 in [facility]  Referring Provider  [name]      Encounter Date: 08/01/2018  Check In: Session Check In - 08/01/18 1120      Check-In   Supervising physician immediately available to respond to emergencies  See telemetry face sheet for immediately available MD    Location  [facility]    Staff Present  Russella Dar, MS, EP, [facility], Exercise Physiologist;Amanda Ballard, Exercise Physiologist;Debra Wynetta Emery, RN, BSN    Medication changes reported      No    Fall or balance concerns reported     No    Tobacco Cessation  --   Quit 2017   Warm-up and Cool-down  Performed as group-led instruction    Resistance Training Performed  Yes    VAD Patient?  No    PAD/SET Patient?  No      Pain Assessment   Currently in Pain?  No/denies    Multiple Pain Sites  No       Capillary Blood Glucose: No results found for this or any previous visit (from the past 24 hour(s)).  Exercise Prescription Changes - 08/01/18 0900      Response to Exercise   Blood Pressure (Admit)  130/68    Blood Pressure (Exercise)  152/62    Blood Pressure (Exit)  132/70    Heart Rate (Admit)  105 bpm    Heart Rate (Exercise)  118 bpm    Heart Rate (Exit)  86 bpm    Oxygen Saturation (Admit)  98 %    Oxygen Saturation (Exercise)  86 %    Oxygen Saturation (Exit)  99 %    Rating of Perceived Exertion (Exercise)  15    Perceived Dyspnea (Exercise)  14    Symptoms  SOB    Comments  6 minute walk test        Social History   Tobacco Use  Smoking Status Former Smoker  . Packs/day: 1.50  . Years: 48.00  . Pack years: 72.00  . Types: Cigarettes  . Last attempt to quit: 10/28/2015  . Years since quitting: 2.7  Smokeless Tobacco Never Used    Goals Met:  Proper associated with RPD/PD & O2  Sat Independence with exercise equipment Exercise tolerated well Personal goals reviewed Queuing for purse lip breathing No report of cardiac concerns or symptoms Strength training completed today  Goals Unmet:  Not Applicable  Comments: Check out: 1015   Dr. Sinda Du is Medical Director for [facility].

## 2018-08-01 NOTE — Progress Notes (Signed)
Cardiac/Pulmonary Rehab Medication Review by a Pharmacist  Does the patient  feel that his/her medications are working for him/her?  Yes- overall breathing better and blood pressure better.   Has the patient been experiencing any side effects to the medications prescribed?  Yes- some meds cause dizziness. MD is optimizing doses. GI side effects/decrease weight from roflumilast. Dose has been decreased.  Does the patient measure his/her own blood pressure or blood glucose at home?  no - normal BP, gets checked at MD  Does the patient have any problems obtaining medications due to transportation or finances?   No- Medicare/part D help with expensive meds  Understanding of regimen: good Understanding of indications: good Potential of compliance: good  Questions asked to Determine Patient Understanding of Medication Regimen:  1. What is the name of the medication?  2. What is the medication used for?  3. When should it be taken?  4. How much should be taken?  5. How will you take it?  6. What side effects should you report?  Understanding Defined as: Excellent: All questions above are correct Good: Questions 1-4 are correct Fair: Questions 1-2 are correct  Poor: 1 or none of the above questions are correct   Pharmacist comments: Overall, patient states breathing has improved and BP normal.  Experienced some side effects but have improved with lower doses.    Tad Moore 08/01/2018 8:45 AM

## 2018-08-05 NOTE — Progress Notes (Signed)
Chart and office note reviewed in detail  > agree with a/p as outlined    

## 2018-08-06 ENCOUNTER — Encounter (HOSPITAL_COMMUNITY)
Admission: RE | Admit: 2018-08-06 | Discharge: 2018-08-06 | Disposition: A | Discharge status: Home / Self Care | Payer: Medicare Other | Source: Ambulatory Visit | Attending: Internal Medicine | Admitting: Internal Medicine

## 2018-08-06 DIAGNOSIS — J449 Chronic obstructive pulmonary disease, unspecified: Secondary | ICD-10-CM | POA: Diagnosis not present

## 2018-08-06 NOTE — Progress Notes (Signed)
Daily Session Note  Patient Details  Name: Cris Talavera MRN: [id number] Date of Birth: [birth year] Referring Provider:     PULMONARY REHAB COPD ORIENTATION from 08/01/2018 in [place]  Referring Provider  [name]      Encounter Date: 08/06/2018  Check In: Session Check In - 08/06/18 1330      Check-In   Supervising physician immediately available to respond to emergencies  See telemetry face sheet for immediately available MD    Location  [facility]    Staff Present  Russella Dar, MS, EP, [facility], Exercise Physiologist;Tong Pieczynski Zachery Conch, Exercise Physiologist    Medication changes reported      No    Fall or balance concerns reported     No    Warm-up and Cool-down  Performed as group-led instruction    Resistance Training Performed  Yes    VAD Patient?  No    PAD/SET Patient?  No      Pain Assessment   Currently in Pain?  No/denies    Pain Score  0-No pain    Multiple Pain Sites  No       Capillary Blood Glucose: No results found for this or any previous visit (from the past 24 hour(s)).    Social History   Tobacco Use  Smoking Status Former Smoker  . Packs/day: 1.50  . Years: 48.00  . Pack years: 72.00  . Types: Cigarettes  . Last attempt to quit: 10/28/2015  . Years since quitting: 2.7  Smokeless Tobacco Never Used    Goals Met:  Proper associated with RPD/PD & O2 Sat Independence with exercise equipment Exercise tolerated well No report of cardiac concerns or symptoms Strength training completed today  Goals Unmet:  Not Applicable  Comments: Pt able to follow exercise prescription today without complaint.  Will continue to monitor for progression. Check out 1430.   Dr. Sinda Du is Medical Director for [facility] Pul[facility] Re[facility].

## 2018-08-08 ENCOUNTER — Encounter (HOSPITAL_COMMUNITY)
Admission: RE | Admit: 2018-08-08 | Discharge: 2018-08-08 | Disposition: A | Discharge status: Home / Self Care | Payer: Medicare Other | Source: Ambulatory Visit | Attending: Internal Medicine | Admitting: Internal Medicine

## 2018-08-08 DIAGNOSIS — J449 Chronic obstructive pulmonary disease, unspecified: Secondary | ICD-10-CM | POA: Diagnosis not present

## 2018-08-08 NOTE — Progress Notes (Signed)
Daily Session Note  Patient Details  Name: Eric Spencer MRN: [id number] Date of Birth: 1946/02/15 Referring Provider:     PULMONARY REHAB COPD ORIENTATION from 08/01/2018 in [place]  Referring Provider  [name]      Encounter Date: 08/08/2018  Check In: Session Check In - 08/08/18 1330      Check-In   Supervising physician immediately available to respond to emergencies  See telemetry face sheet for immediately available MD    Location  [facility]    Staff Present  Benay Pike, Exercise Physiologist;Debra Wynetta Emery, RN, BSN    Medication changes reported      No    Fall or balance concerns reported     No    Warm-up and Cool-down  Performed as group-led instruction    Resistance Training Performed  Yes    VAD Patient?  No    PAD/SET Patient?  No      Pain Assessment   Currently in Pain?  No/denies    Pain Score  0-No pain    Multiple Pain Sites  No       Capillary Blood Glucose: No results found for this or any previous visit (from the past 24 hour(s)).    Social History   Tobacco Use  Smoking Status Former Smoker  . Packs/day: 1.50  . Years: 48.00  . Pack years: 72.00  . Types: Cigarettes  . Last attempt to quit: 10/28/2015  . Years since quitting: 2.7  Smokeless Tobacco Never Used    Goals Met:  Proper associated with RPD/PD & O2 Sat Independence with exercise equipment Improved SOB with ADL's Using PLB without cueing & demonstrates good technique Exercise tolerated well No report of cardiac concerns or symptoms  Strength training performed today   Goals Unmet:  Not Applicable  Comments: Pt able to follow exercise prescription today without complaint.  Will continue to monitor for progression. Check out 1430.   Dr. Sinda Du is Medical Director for [facility] Pul[facility] Re[facility].

## 2018-08-12 DIAGNOSIS — M9903 Segmental and somatic dysfunction of lumbar region: Secondary | ICD-10-CM | POA: Diagnosis not present

## 2018-08-12 DIAGNOSIS — M545 Low back pain: Secondary | ICD-10-CM | POA: Diagnosis not present

## 2018-08-13 ENCOUNTER — Encounter (HOSPITAL_COMMUNITY)
Admission: RE | Admit: 2018-08-13 | Discharge: 2018-08-13 | Disposition: A | Discharge status: Home / Self Care | Payer: Medicare Other | Source: Ambulatory Visit | Attending: Internal Medicine | Admitting: Internal Medicine

## 2018-08-13 DIAGNOSIS — J449 Chronic obstructive pulmonary disease, unspecified: Secondary | ICD-10-CM | POA: Diagnosis not present

## 2018-08-13 NOTE — Progress Notes (Signed)
Daily Session Note  Patient Details  Name: Eric Spencer MRN: [id number] Date of Birth: [birth year] Referring Provider:     PULMONARY REHAB COPD ORIENTATION from 08/01/2018 in [place]  Referring Provider  [name]      Encounter Date: 08/13/2018  Check In: Session Check In - 08/13/18 1330      Check-In   Supervising physician immediately available to respond to emergencies  See telemetry face sheet for immediately available MD    Location  [facility]    Staff Present  Benay Pike, Exercise Physiologist;Diane Coad, MS, EP, [facility], Exercise Physiologist    Medication changes reported      No    Fall or balance concerns reported     No    Warm-up and Cool-down  Performed as group-led instruction    Resistance Training Performed  Yes    VAD Patient?  No    PAD/SET Patient?  No      Pain Assessment   Currently in Pain?  No/denies    Pain Score  0-No pain    Multiple Pain Sites  No       Capillary Blood Glucose: No results found for this or any previous visit (from the past 24 hour(s)).    Social History   Tobacco Use  Smoking Status Former Smoker  . Packs/day: 1.50  . Years: 48.00  . Pack years: 72.00  . Types: Cigarettes  . Last attempt to quit: 10/28/2015  . Years since quitting: 2.7  Smokeless Tobacco Never Used    Goals Met:  Proper associated with RPD/PD & O2 Sat Independence with exercise equipment Using PLB without cueing & demonstrates good technique Exercise tolerated well No report of cardiac concerns or symptoms Strength training completed today  Goals Unmet:  Not Applicable  Comments: Pt able to follow exercise prescription today without complaint.  Will continue to monitor for progression. Check out 1430.   Dr. Sinda Du is Medical Director for [facility].

## 2018-08-14 ENCOUNTER — Ambulatory Visit: Payer: Medicare Other | Admitting: Podiatry

## 2018-08-15 ENCOUNTER — Encounter (HOSPITAL_COMMUNITY)
Admission: RE | Admit: 2018-08-15 | Discharge: 2018-08-15 | Disposition: A | Discharge status: Home / Self Care | Payer: Medicare Other | Source: Ambulatory Visit | Attending: Internal Medicine | Admitting: Internal Medicine

## 2018-08-15 DIAGNOSIS — J449 Chronic obstructive pulmonary disease, unspecified: Secondary | ICD-10-CM

## 2018-08-15 NOTE — Progress Notes (Signed)
Daily Session Note  Patient Details  Name: Eric Spencer MRN: [id number] Date of Birth: [birth year] Referring Provider:     PULMONARY REHAB COPD ORIENTATION from 08/01/2018 in [place]  Referring Provider  [name]      Encounter Date: 08/15/2018  Check In: Session Check In - 08/15/18 1322      Check-In   Supervising physician immediately available to respond to emergencies  See telemetry face sheet for immediately available MD    Location  [facility]    Staff Present  Benay Pike, Exercise Physiologist;Debra Wynetta Emery, RN, BSN    Medication changes reported      No    Fall or balance concerns reported     No    Warm-up and Cool-down  Performed as group-led instruction    Resistance Training Performed  Yes    VAD Patient?  No    PAD/SET Patient?  No      Pain Assessment   Currently in Pain?  No/denies    Pain Score  0-No pain    Multiple Pain Sites  No       Capillary Blood Glucose: No results found for this or any previous visit (from the past 24 hour(s)).    Social History   Tobacco Use  Smoking Status Former Smoker  . Packs/day: 1.50  . Years: 48.00  . Pack years: 72.00  . Types: Cigarettes  . Last attempt to quit: 10/28/2015  . Years since quitting: 2.8  Smokeless Tobacco Never Used    Goals Met:  Proper associated with RPD/PD & O2 Sat Independence with exercise equipment Using PLB without cueing & demonstrates good technique Exercise tolerated well No report of cardiac concerns or symptoms Strength training completed today  Goals Unmet:  Not Applicable  Comments: Pt able to follow exercise prescription today without complaint.  Will continue to monitor for progression. Check out 1430.   Dr. Sinda Du is Medical Director for [facility].

## 2018-08-20 ENCOUNTER — Encounter (HOSPITAL_COMMUNITY)
Admission: RE | Admit: 2018-08-20 | Discharge: 2018-08-20 | Disposition: A | Discharge status: Home / Self Care | Payer: Medicare Other | Source: Ambulatory Visit | Attending: Internal Medicine | Admitting: Internal Medicine

## 2018-08-20 DIAGNOSIS — J449 Chronic obstructive pulmonary disease, unspecified: Secondary | ICD-10-CM | POA: Diagnosis not present

## 2018-08-20 NOTE — Progress Notes (Signed)
Daily Session Note  Patient Details  Name: Eric Spencer MRN: [id number] Date of Birth: [birth year] Referring Provider:     PULMONARY REHAB COPD ORIENTATION from 08/01/2018 in [place]  Referring Provider  [name]      Encounter Date: 08/20/2018  Check In: Session Check In - 08/20/18 1330      Check-In   Supervising physician immediately available to respond to emergencies  See telemetry face sheet for immediately available MD    Location  [facility]    Staff Present  Benay Pike, Exercise Physiologist;Diane Coad, MS, EP, [facility], Exercise Physiologist;Other    Medication changes reported      No    Fall or balance concerns reported     No    Warm-up and Cool-down  Performed as group-led instruction    Resistance Training Performed  Yes    VAD Patient?  No    PAD/SET Patient?  No      Pain Assessment   Currently in Pain?  No/denies    Pain Score  0-No pain    Multiple Pain Sites  No       Capillary Blood Glucose: No results found for this or any previous visit (from the past 24 hour(s)).    Social History   Tobacco Use  Smoking Status Former Smoker  . Packs/day: 1.50  . Years: 48.00  . Pack years: 72.00  . Types: Cigarettes  . Last attempt to quit: 10/28/2015  . Years since quitting: 2.8  Smokeless Tobacco Never Used    Goals Met:  Proper associated with RPD/PD & O2 Sat Independence with exercise equipment Improved SOB with ADL's Using PLB without cueing & demonstrates good technique Exercise tolerated well No report of cardiac concerns or symptoms Strength training completed today  Goals Unmet:  Not Applicable  Comments: Pt able to follow exercise prescription today without complaint.  Will continue to monitor for progression. Check out 1430.   Dr. Sinda Du is Medical Director for [facility] Pulmonary Rehab.

## 2018-08-22 ENCOUNTER — Encounter (HOSPITAL_COMMUNITY)
Admission: RE | Admit: 2018-08-22 | Discharge: 2018-08-22 | Disposition: A | Discharge status: Home / Self Care | Payer: Medicare Other | Source: Ambulatory Visit | Attending: Internal Medicine | Admitting: Internal Medicine

## 2018-08-22 DIAGNOSIS — J449 Chronic obstructive pulmonary disease, unspecified: Secondary | ICD-10-CM

## 2018-08-22 NOTE — Progress Notes (Signed)
Daily Session Note  Patient Details  Name: Eric Spencer MRN: [id number] Date of Birth: 27-Jul-1945 Referring Provider:     PULMONARY REHAB COPD ORIENTATION from 08/01/2018 in [place]  Referring Provider  [name]      Encounter Date: 08/22/2018  Check In: Session Check In - 08/22/18 1330      Check-In   Supervising physician immediately available to respond to emergencies  See telemetry face sheet for immediately available MD    Location  [facility]    Staff Present  Russella Dar, MS, EP, [facility], Exercise Physiologist;Other;Nathalia Wismer Wynetta Emery, RN, BSN    Medication changes reported      No    Fall or balance concerns reported     No    Warm-up and Cool-down  Performed as group-led instruction    Resistance Training Performed  Yes    VAD Patient?  No    PAD/SET Patient?  No      Pain Assessment   Currently in Pain?  No/denies    Pain Score  0-No pain    Multiple Pain Sites  No       Capillary Blood Glucose: No results found for this or any previous visit (from the past 24 hour(s)).    Social History   Tobacco Use  Smoking Status Former Smoker  . Packs/day: 1.50  . Years: 48.00  . Pack years: 72.00  . Types: Cigarettes  . Last attempt to quit: 10/28/2015  . Years since quitting: 2.8  Smokeless Tobacco Never Used    Goals Met:  Proper associated with RPD/PD & O2 Sat Independence with exercise equipment Improved SOB with ADL's Using PLB without cueing & demonstrates good technique Exercise tolerated well No report of cardiac concerns or symptoms Strength training completed today  Goals Unmet:  Not Applicable  Comments: Pt able to follow exercise prescription today without complaint.  Will continue to monitor for progression. Check out 1430.   Dr. Sinda Du is Medical Director for [facility] Pul[facility] Rehab.

## 2018-08-26 ENCOUNTER — Encounter: Payer: Self-pay | Admitting: Internal Medicine

## 2018-08-26 ENCOUNTER — Ambulatory Visit (INDEPENDENT_AMBULATORY_CARE_PROVIDER_SITE_OTHER): Payer: Medicare Other | Admitting: Internal Medicine

## 2018-08-26 VITALS — BP 102/74 | HR 71 | Ht 74.0 in | Wt 197.6 lb

## 2018-08-26 DIAGNOSIS — J449 Chronic obstructive pulmonary disease, unspecified: Secondary | ICD-10-CM | POA: Diagnosis not present

## 2018-08-26 DIAGNOSIS — R05 Cough: Secondary | ICD-10-CM | POA: Diagnosis not present

## 2018-08-26 DIAGNOSIS — R059 Cough, unspecified: Secondary | ICD-10-CM

## 2018-08-26 MED ORDER — PREDNISONE 10 MG PO TABS: ORAL_TABLET | ORAL | 0 refills | Status: AC

## 2018-08-26 MED ORDER — BUDESONIDE 0.25 MG/2ML IN SUSP: RESPIRATORY_TRACT | 12 refills | Status: AC

## 2018-08-26 NOTE — Progress Notes (Signed)
Subjective:    Patient ID: Eric Spencer, male   DOB: 28-Feb-1946    MRN: [id number]    Brief patient profile:  30  yowm  Quit smoking 10/2016  asthma as child up until age 73  Played baseball/ wrestling in HS with sinus problems fall and spring in his 20's while in [place] better with cortisone sprays  starting age 31's and better after teeth pulled around 2009  Then 2010 moved back to [place] then next year developed seasonal cough/ sob better p abx/pred and prn saba and completely better in between upper resp symptoms  s any chronic rx and then around 2017 noted chronic doe/ worse with bending over > then more chronic cough/sob  since May 2018 despite stopping smoking and starting on trelegy and singulair and then much worse x early April 2019 while on maint rx with trelegy / singulair  so self referred to pulmonary clinic.      Brief patient profile:  10/25/2017 1st [facility] office visit/ Eric Spencer   Chief Complaint  Patient presents with  . Pulmonary Consult    Referred by [facility].  Pt c/o SOB and cough off and on for the past several years- worse over the past 2-3 wks. He c/o SOB with walking short distances. His cough is non prod. He is using his albuterol inhaler 4 x daily and neb with albuterol 3 x daily on average.   since acute symptoms coughing /sob more so than usual  Whereas 3 weeks prior to OV  Had been maintained on trelegy and singulair and rarely needing saba in any form then acutely worse and no better p pred and levaquin this time  Having severe coughing fits with min Mucus is still yellow esp in am none from nose  /has two more days levaquin  Despite severe cough sob better and Now can go up to steps better than a week .prior to OV   but still way over using saba including in waiting room today and misunderstands how/ when to use saba  Already seen in ER with tachycardia 10/18/17 with MI ruled out  Does better at hs now on 2 pillows in terms  of sob but severe hacking cough disrupts sleep rec Plan A = Automatic =  symbicort 160 Take 2 puffs first thing in am and then another 2 puffs about 12 hours later.  Work on inhaler technique:   Plan B = Backup Only use your albuterol as a rescue medication Plan C = Crisis - only use your albuterol/ipatropium  nebulizer if you first try Plan B For cough >  mucinex dm 1200 mg every 12 hours and use the flutter valve as needed  Stop losartan and start bystolic 10 mg daily until return  Pantoprazole (protonix) 40 mg   Take  30-60 min before first meal of the day and Pepcid (famotidine)  20 mg one @  bedtime until return to office - this is the best way to tell whether stomach acid is contributing to your problem.   GERD  Diet .    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 Follow-up    Cough has SOB improved and then worsened again approx 1 wk after the last visit. He has not had to use nebs.   Dyspnea:  Was able to do [employer]  not now Cough: worst first thing in am /light green in am / says using flutter but still feeling urge to force a cough daytime  Sleep: no with breathing problems or noct disturbance due to cough  SABA use:  Has not used it much  rec When you finish the bystolic switch over to bisprolol 5 mg one tablet daily  Work on maintaining perferct  inhaler technique: Prednisone 10 mg take  4 each am x 2 days,   2 each am x 2 days,  1 each am x 2 days and stop  Doxycline 100 mg twice daily x 10 days  schedule  Sinus  CT  neg Add: did not go to lab/ can wait until next ov     12/21/2017  f/u ov/Eric Spencer re:  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 Follow-up    PFT's done today. Pt states breathing seems worse since the last visit. He is using his albuterol inhaler 2 x daily on average and rarely uses neb.   Dyspnea:  Worse since April  2019  Cough:  Worse since mowed grass 12/18/17  SABA WUJ:WJXBJYNuse:reports quite a bit better p saba despite symb adherence at 2 puffs q 12 / always better with pred also 02: none   rec Prednisone 10 mg  X 2 each am until better then one daily until return  Plan A = Automatic = symbicort 160x 2 and spiriva 2 pffs each am / symbicort 160 x 2  Work on inhaler technique:  Plan B = Backup Only use your albuterol as a rescue medication Plan C = Crisis - only use your albuterol nebulizer if you first try Plan B and it fails to help > ok to use the nebulizer up to every 4 hours but if start needing it regularly call for immediate appointment    01/22/2018  f/u ov/Eric Spencer re:  GOLD IV  Copd / maint on sym/spiriva and prednisone  Chief Complaint  Patient presents with  . Follow-up    Breathing has been worse the past few days.  He is wheezing some and coughing with yellow sputum. He is using his albuterol inhaler 2 x daily and neb once per wk on average. He has been having some swelling in his ankles since he started amlodipine.   Dyspnea:  Push cart all day around [employer] slow pace = MMRC2 = can't walk a nl pace on a flat grade s sob but does fine slow and flat but breathing worse since mowed grass sev days prior to OV   Cough: daytime cough/ using flatter valve but not sure it's working properly, did not bring it  SABA use: sev times daily helps some 02:  No  rec Put blocks under the head of your bed  Daliresp 500 mg every other day for a month then 500 mg daily   If breathing worse and need nebulizer >  Prednisone 10 mg 2 daily until better then one daily thereafter For cough/ congestion > mucinex dm up to 1200 mg every 12 hours and use the flutter valve as much as possible  We will be referring you to pulmonary rehab and allergy  - consider hrct next to look for bronchiectasis     03/18/2018  f/u ov/Eric Spencer re: GOLD IV  symbicort /  No longer spiriva/ prednisone ? deliresp  Chief Complaint  Patient  presents with  . Follow-up    Breathing has improved some since  the last visit. He is using his albuterol inhaler 2 x daily and neb with albuterol 2 x wkly on average.   Dyspnea:  50 ft  = MMRC3 = can't walk 100 yards even at a slow pace at a flat grade s stopping due to sob   Cough: is better on dalirest put still has sensation throat clogged up from drainage worse since finished prednisone "ran out" but producing minimal mucoid sputum at this point Sleeping: can't tolerate bed elevation with bed blocks though was using large cinder blocks previously  SABA use: twice daily hfa / twice a week neb  rec Plan A = Automatic =  Performist(or Brovana)  twice daily and Yupelri once daily (stop symbicort)  Plan B = Backup Only use your albuterol as a rescue medication  Plan C = Crisis - only use your albuterol nebulizer if you first try Plan B and it fails to help > ok to use the nebulizer up to every 4 hours but if start needing it regularly call for immediate appointment Plan D = Deltasone If you feel like you are loosing ground > prednisone 20 mg per day until better then 10 mg per day x 5 days and stop Please see patient coordinator before you leave today  to schedule [facility] to deliver neb and meds         04/15/2018  f/u ov/Eric Spencer re: GOLD IV COPD / brov/yupelri   And no prednisone maint Chief Complaint  Patient presents with  . Follow-up    COPD GOLD IV patient uses albuterol BID, increased SOB and cough  in every way (except singing) better until 5 day prior to OV  Then acute chills/ yellow drainage - started  on prednisone 20mg  daily but no better  Dyspnea:  More sob x 50 ft  Cough: thick mucus / no mucinex dm Sleeping: blocks  SABA use: just using daily around 4 pm and occ hs but did not increase as rec with flare  02: no zpak  For cough > mucinex or mucinex dm up to 1200 mg every 12 hours and use the flutter valve  For swelling > maxzide 25 one daily as needed  Please remember to  go to the lab and x-ray department downstairs in the basement  for your tests - we will call you with the results when they are available.     NP rec 04/29/18  rec May stop Singulair .  Take Prednisone 10mg  daily for 5 days and 1/2 daily for 5 days and then stop .  Bring Neb meds to next visit.  Use Flutter valve Twice daily  .  Mucinex Twice daily  As needed  Cough/congestion  Saline  Nasal spray 2 puffs Twice daily   Use Nasacort Nasal 2 puffs daily As needed  Nasal congestion     05/27/2018  f/u ov/Eric Spencer re:  Copd gold iv / awaiting rehab start maint on brovana/yupelri/daliresp though did not bring meds as req  Chief Complaint  Patient presents with  . Follow-up    Breathing worse since last visit. He is using his ventolin inhaler 3 x daily on average.  He has used his albuterol neb once since his last visit.   Dyspnea:  MMRC2 = can't walk a nl pace on a flat grade s sob but does fine slow and flat  Cough: worse day than noct/ some worse with nasal congestion flares   Sleeping: bed blocks/ no 02  SABA use: sometimes wakes up  from nap  02: none/ has tank for emergencies  From fire dept/ wants supply but declines ono RA rec Only use your albuterol inhaler as a rescue medication to be used if you can't catch your breath by resting or doing a relaxed purse lip breathing pattern.  - The less you use it, the better it will work when you need it. - Ok to use up to 2 puffs  every 4 hours if you must but call for immediate appointment if use goes up over your usual need - Don't leave home without it !!  (think of it like the spare tire for your car) Work on perfecting your inhaler technique:   If you feel like you are loosing ground > prednisone 20 mg per day until better then 10 mg per day x 5 days and stop    07/19/2018 May stop Singulair .  Take Prednisone 10mg  daily for 5 days and 1/2 daily for 5 days and then stop .  Bring Neb meds to next visit.  Use Flutter valve Twice daily  .   Mucinex Twice daily  As needed  Cough/congestion  Saline  Nasal spray 2 puffs Twice daily   Use Nasacort Nasal 2 puffs daily As needed  Nasal congestion  Refer to Pulmonary rehab> started  08/01/2018 and immediately improved    08/26/2018  f/u ov/Eric Spencer re: presently on prednisone 20 mg  And not following weaning instructions/  still on singulair and daliresp 250 mg daily plus maint yupelri/brovana Chief Complaint  Patient presents with  . Follow-up    Pt c/o wheezing for the past 5 days. He has cough with clear sputum. He has been using his albuterol inhaler once per day on average and has used albuterol neb once in the past wk.  Dyspnea:  17 min at . flat s 02  Cough: 5 days prior to OV  Flared but had been gone mostly in am's but does not wake him up / assoc with pnds Sleeping: on side with 4 inch bed blocks  SABA use: as above  02: none    No obvious day to day or daytime variability or assoc excess/ purulent sputum or mucus plugs or hemoptysis or cp or chest tightness, subjective wheeze or overt sinus or hb symptoms.   Sleeping as above  without nocturnal   exacerbation  of respiratory  c/o's or need for noct saba. Also denies any obvious fluctuation of symptoms with weather or environmental changes or other aggravating or alleviating factors except as outlined above   No unusual exposure hx or h/o childhood pna/ asthma or knowledge of premature birth.  Current Allergies, Complete Past Medical History, Past Surgical History, Family History, and Social History were reviewed in [employer] record.  ROS  The following are not active complaints unless bolded Hoarseness, sore throat, dysphagia, dental problems, itching, sneezing,  nasal congestion or discharge of excess mucus or purulent secretions, ear ache,   fever, chills, sweats, unintended wt loss or wt gain, classically pleuritic or exertional cp,  orthopnea pnd or arm/hand swelling  or leg swelling,  presyncope, palpitations, abdominal pain, anorexia, nausea, vomiting, diarrhea  or change in bowel habits or change in bladder habits, change in stools or change in urine, dysuria, hematuria,  rash, arthralgias, visual complaints, headache, numbness, weakness or ataxia or problems with walking or coordination,  change in mood or  memory.        Current Meds  Medication Sig  . albuterol (  PROVENTIL) (2.5 MG/3ML) 0.083% nebulizer solution Take 3 mLs (2.5 mg total) by nebulization every 6 (six) hours as needed for wheezing or shortness of breath.  Marland Kitchen amLODipine (NORVASC) 10 MG tablet TAKE 1 TABLET BY MOUTH ONCE DAILY.  Marland Kitchen arformoterol (BROVANA) 15 MCG/2ML NEBU Take 2 mLs (15 mcg total) by nebulization 2 (two) times daily.  . bisoprolol (ZEBETA) 5 MG tablet Take 1 tablet (5 mg total) by mouth daily.  Marland Kitchen dextromethorphan-guaiFENesin (MUCINEX DM) 30-600 MG 12hr tablet Take 1 tablet by mouth 2 (two) times daily.  . famotidine (PEPCID) 20 MG tablet One at bedtime  . ibuprofen (ADVIL,MOTRIN) 200 MG tablet Take 200 mg by mouth every 6 (six) hours as needed.  . montelukast (SINGULAIR) 10 MG tablet TAKE 1 TABLET BY MOUTH ONCE DAILY.  . pantoprazole (PROTONIX) 40 MG tablet TAKE ONE TABLET BY MOUTH DAILY 30 TO 60 MINUTES BEFORE FIRST MEAL OF THE DAY.  Marland Kitchen Respiratory Therapy Supplies (FLUTTER) DEVI 1 Device by Does not apply route as needed.  . Revefenacin (YUPELRI) 175 MCG/3ML SOLN Inhale 3 mLs into the lungs daily.  . Roflumilast (DALIRESP) 250 MCG TABS Take 250 mcg by mouth daily.  Marland Kitchen triamcinolone (NASACORT) 55 MCG/ACT AERO nasal inhaler Place 2 sprays into the nose daily.  Marland Kitchen triamterene-hydrochlorothiazide (MAXZIDE-25) 37.5-25 MG tablet One daily as needed for leg swelling  . VENTOLIN HFA 108 (90 Base) MCG/ACT inhaler INHALE 1 TO 2 PUFFS INTO LUNGS EVERY 4 HOURS AS NEEDED.                          Objective:   Physical Exam  amb wm min rattling   08/26/2018       197  05/27/2018        195 03/18/2018      201  01/22/2018      217  12/21/2017       206  11/09/2017       203   10/25/17 204 lb (92.5 kg)  10/18/17 208 lb (94.3 kg)  10/18/17 208 lb (94.3 kg)     Vital signs reviewed - Note on arrival 02 sats  93% on RA       HEENT: nl dentition / oropharynx. Nl external ear canals without cough reflex -  Mild bilateral non-specific turbinate edema     NECK :  without JVD/Nodes/TM/ nl carotid upstrokes bilaterally   LUNGS: no acc muscle use,  Mod barrel  contour chest wall with bilateral  Distant bs s audible wheeze and  without cough on insp or exp maneuver and mod  Hyperresonant  to  percussion bilaterally     CV:  RRR  no s3 or murmur or increase in P2, and 1+ pitting edema both ankles  ABD:  soft and nontender with pos mid insp Hoover's  in the supine position. No bruits or organomegaly appreciated, bowel sounds nl  MS:   Nl gait/  ext warm without deformities, calf tenderness, cyanosis or clubbing No obvious joint restrictions   SKIN: warm and dry without lesions    NEURO:  alert, approp, nl sensorium with  no motor or cerebellar deficits apparent.               Assessment:

## 2018-08-26 NOTE — Patient Instructions (Addendum)
As needed for itchy/ sneezy runny nose>>>   Zyrtec 10mg   daily   For cough /congestion >>  mucinex dm up to 1200 mg every 12 hours and use the flutter valve as much as possible   Add budesonide 0.25 mg twice daily to your nebulizer     If you feel like you are loosing ground > prednisone 20 mg per day until better then 10 mg per day x 5 days and stop   Please schedule a follow up office visit in 6 weeks, call sooner if needed with all medications /inhalers/ solutions in hand so we can verify exactly what you are taking. This includes all medications from all doctors and over the counters - PLEASE separate them into two bags:  the ones you take automatically, no matter what, vs the ones you take just when you feel you need them "BAG #2 is UP TO YOU"  - this will really help [place] help you take your medications more effectively.  - to see Tammy NP on return

## 2018-08-27 ENCOUNTER — Encounter (HOSPITAL_COMMUNITY)
Admission: RE | Admit: 2018-08-27 | Discharge: 2018-08-27 | Disposition: A | Discharge status: Home / Self Care | Payer: Medicare Other | Source: Ambulatory Visit | Attending: Internal Medicine | Admitting: Internal Medicine

## 2018-08-27 DIAGNOSIS — J449 Chronic obstructive pulmonary disease, unspecified: Secondary | ICD-10-CM

## 2018-08-27 NOTE — Progress Notes (Signed)
Daily Session Note  Patient Details  Name: Eric Spencer MRN: [id number] Date of Birth: 1946-03-14 Referring Provider:     PULMONARY REHAB COPD ORIENTATION from 08/01/2018 in [place]  Referring Provider  [name]      Encounter Date: 08/27/2018  Check In: Session Check In - 08/27/18 1330      Check-In   Supervising physician immediately available to respond to emergencies  See telemetry face sheet for immediately available MD    Location  [facility]    Staff Present  Russella Dar, MS, EP, [facility], Exercise Physiologist;Topacio Cella Zachery Conch, Exercise Physiologist    Medication changes reported      No    Fall or balance concerns reported     No    Warm-up and Cool-down  Performed as group-led instruction    Resistance Training Performed  Yes    VAD Patient?  No    PAD/SET Patient?  No      Pain Assessment   Currently in Pain?  No/denies    Pain Score  0-No pain    Multiple Pain Sites  No       Capillary Blood Glucose: No results found for this or any previous visit (from the past 24 hour(s)).    Social History   Tobacco Use  Smoking Status Former Smoker  . Packs/day: 1.50  . Years: 48.00  . Pack years: 72.00  . Types: Cigarettes  . Last attempt to quit: 10/28/2015  . Years since quitting: 2.8  Smokeless Tobacco Never Used    Goals Met:  Proper associated with RPD/PD & O2 Sat Independence with exercise equipment Improved SOB with ADL's Exercise tolerated well No report of cardiac concerns or symptoms Strength training completed today  Goals Unmet:  Not Applicable  Comments: Pt able to follow exercise prescription today without complaint.  Will continue to monitor for progression. Check out 1430.   Dr. Sinda Du is Medical Director for [facility].

## 2018-08-28 ENCOUNTER — Encounter: Payer: Self-pay | Admitting: Internal Medicine

## 2018-08-28 NOTE — Assessment & Plan Note (Signed)
Sinus ct 11/28/2017 >>> Clear paranasal sinuses Allergy profile 12/21/2017 >  Eos 0.0 /  IgE 540 RAST pos grass and trees but basically also everything else but cats  - flare 01/22/2018 while on singulair > d/c'd  - referred to allergy 01/22/2018 >  seen by Dellis Anes 02/20/18  Reviewed action plans with him in detail and noted he struggles a lot with the concept of a maintenance versus a as needed.  And I noted that he struggled a lot with the concept of a maintenance versus a as needed medication.    To keep things simple, I have asked the patient to first separate medicines that are perceived as maintenance, that is to be taken daily "no matter what", from those medicines that are taken on only on an as-needed basis and I have given the patient examples of both, and then return to regroup.   I had an extended discussion with the patient reviewing all relevant studies completed to date and  lasting 15 to 20 minutes of a 25 minute visit    See device teaching which extended face to face time for this visit.  Each maintenance medication was reviewed in detail including emphasizing most importantly the difference between maintenance and prns and under what circumstances the prns are to be triggered using an action plan format that is not reflected in the computer generated alphabetically organized AVS which I have not found useful in most complex patients, especially with respiratory illnesses  Please see AVS for specific instructions unique to this visit that I personally wrote and verbalized to the the pt in detail and then reviewed with pt  by my nurse highlighting any  changes in therapy recommended at today's visit to their plan of care.

## 2018-08-28 NOTE — Assessment & Plan Note (Signed)
Quit smoking 10/2016  PFT's  12/21/2017  FEV1 1.04 (29 % ) ratio 39  p 15 % improvement from saba p symbicort 160 x 2  prior to study with DLCO  37 % corrects to 41 % for alv volume   - 12/21/2017  After extensive coaching inhaler device  effectiveness =    75% SMI > start spiriva smi 2.5 x 2 qam - Prednisone maint rx 12/21/2017 > better then ran out  - alpha one AT Screen   MM  191  - rec trial of dilaresp 01/22/2018  ? Improved 03/18/2018  - changed to laba/lama neb 03/18/2018 and changed pred to Plan D with floor 0  - pulmonary rehab mid feb 2020  - 08/26/2018  After extensive coaching inhaler device,  effectiveness =  90%   - 08/26/2018 added budesonide 0.25 bid to neb    Clearly  Group D in terms of symptom/risk and laba/lama/ICS  therefore appropriate rx at this point >>>  Add ics = bud as above  The goal with a chronic steroid dependent illness is always arriving at the lowest effective dose that controls the disease/symptoms and not accepting a set "formula" which is based on statistics or guidelines that don't always take into account patient  variability or the natural hx of the dz in every individual patient, which may well vary over time.  For now therefore I recommend the patient maintain  20 mg ceiling and taper to 0 if tolerates once budesonide full effects (one week)

## 2018-08-29 ENCOUNTER — Other Ambulatory Visit: Payer: Self-pay | Admitting: Family Medicine

## 2018-08-29 ENCOUNTER — Encounter (HOSPITAL_COMMUNITY)
Admission: RE | Admit: 2018-08-29 | Discharge: 2018-08-29 | Disposition: A | Discharge status: Home / Self Care | Payer: Medicare Other | Source: Ambulatory Visit | Attending: Internal Medicine | Admitting: Internal Medicine

## 2018-08-29 DIAGNOSIS — J449 Chronic obstructive pulmonary disease, unspecified: Secondary | ICD-10-CM

## 2018-08-29 DIAGNOSIS — I10 Essential (primary) hypertension: Secondary | ICD-10-CM

## 2018-08-29 NOTE — Progress Notes (Signed)
Daily Session Note  Patient Details  Name: Kylee Nardozzi MRN: [id number] Date of Birth: 05/24/1946 Referring Provider:     PULMONARY REHAB COPD ORIENTATION from 08/01/2018 in [place]  Referring Provider  [name]      Encounter Date: 08/29/2018  Check In: Session Check In - 08/29/18 1045      Check-In   Supervising physician immediately available to respond to emergencies  See telemetry face sheet for immediately available MD    Location  [facility]    Staff Present  Russella Dar, MS, EP, [facility], Exercise Physiologist;Lael Wetherbee, Exercise Physiologist;Debra Wynetta Emery, RN, BSN    Medication changes reported      No    Fall or balance concerns reported     No    Warm-up and Cool-down  Performed as group-led instruction    Resistance Training Performed  Yes    VAD Patient?  No    PAD/SET Patient?  No      Pain Assessment   Currently in Pain?  No/denies    Pain Score  0-No pain    Multiple Pain Sites  No       Capillary Blood Glucose: No results found for this or any previous visit (from the past 24 hour(s)).    Social History   Tobacco Use  Smoking Status Former Smoker  . Packs/day: 1.50  . Years: 48.00  . Pack years: 72.00  . Types: Cigarettes  . Last attempt to quit: 10/28/2015  . Years since quitting: 2.8  Smokeless Tobacco Never Used    Goals Met:  Proper associated with RPD/PD & O2 Sat Independence with exercise equipment Improved SOB with ADL's Exercise tolerated well No report of cardiac concerns or symptoms Strength training completed today  Goals Unmet:  Not Applicable  Comments: Pt able to follow exercise prescription today without complaint.  Will continue to monitor for progression. Check out 1145.   Dr. Sinda Du is Medical Director for [facility] Re[facility].

## 2018-08-29 NOTE — Progress Notes (Signed)
Daily Session Note  Patient Details  Name: Eric Spencer MRN: [id number] Date of Birth: 09-23-1945 Referring Provider:     PULMONARY REHAB COPD ORIENTATION from 08/01/2018 in [place]  Referring Provider  [name]      Encounter Date: 08/29/2018  Check In: Session Check In - 08/29/18 1045      Check-In   Supervising physician immediately available to respond to emergencies  See telemetry face sheet for immediately available MD    Location  [facility]    Staff Present  Russella Dar, MS, EP, [facility], Exercise Physiologist;Kaelynne Christley, Exercise Physiologist;Debra Wynetta Emery, RN, BSN    Medication changes reported      No    Fall or balance concerns reported     No    Warm-up and Cool-down  Performed as group-led instruction    Resistance Training Performed  Yes    VAD Patient?  No    PAD/SET Patient?  No      Pain Assessment   Currently in Pain?  No/denies    Pain Score  0-No pain    Multiple Pain Sites  No       Capillary Blood Glucose: No results found for this or any previous visit (from the past 24 hour(s)).    Social History   Tobacco Use  Smoking Status Former Smoker  . Packs/day: 1.50  . Years: 48.00  . Pack years: 72.00  . Types: Cigarettes  . Last attempt to quit: 10/28/2015  . Years since quitting: 2.8  Smokeless Tobacco Never Used    Goals Met:  Proper associated with RPD/PD & O2 Sat Independence with exercise equipment Exercise tolerated well No report of cardiac concerns or symptoms Strength training completed today  Goals Unmet:  Not Applicable  Comments: Pt able to follow exercise prescription today without complaint.  Will continue to monitor for progression. Check out 1145.   Dr. Sinda Du is Medical Director for [facility].

## 2018-08-29 NOTE — Progress Notes (Signed)
Pulmonary Individual Treatment Plan  Patient Details  Name: Eric Spencer MRN: [id number] Date of Birth: 1946/04/09 Referring Provider:     PULMONARY REHAB COPD ORIENTATION from 08/01/2018 in [facility]  Referring Provider  [name]      Initial Encounter Date:    PULMONARY REHAB COPD ORIENTATION from 08/01/2018 in [facility]  Date  08/01/18      Visit Diagnosis: COPD mixed type (HCC)  Patient's Home Medications on Admission:   Current Outpatient Medications:  .  albuterol (PROVENTIL) (2.5 MG/3ML) 0.083% nebulizer solution, Take 3 mLs (2.5 mg total) by nebulization every 6 (six) hours as needed for wheezing or shortness of breath., Disp: 150 mL, Rfl: 1 .  amLODipine (NORVASC) 10 MG tablet, TAKE 1 TABLET BY MOUTH ONCE DAILY., Disp: 90 tablet, Rfl: 0 .  arformoterol (BROVANA) 15 MCG/2ML NEBU, Take 2 mLs (15 mcg total) by nebulization 2 (two) times daily., Disp: 120 mL, Rfl: 11 .  bisoprolol (ZEBETA) 5 MG tablet, Take 1 tablet (5 mg total) by mouth daily., Disp: 30 tablet, Rfl: 11 .  budesonide (PULMICORT) 0.25 MG/2ML nebulizer solution, One twice daily with brovana, Disp: 120 mL, Rfl: 12 .  dextromethorphan-guaiFENesin (MUCINEX DM) 30-600 MG 12hr tablet, Take 1 tablet by mouth 2 (two) times daily., Disp: , Rfl:  .  famotidine (PEPCID) 20 MG tablet, One at bedtime, Disp: 30 tablet, Rfl: 11 .  ibuprofen (ADVIL,MOTRIN) 200 MG tablet, Take 200 mg by mouth every 6 (six) hours as needed., Disp: , Rfl:  .  montelukast (SINGULAIR) 10 MG tablet, TAKE 1 TABLET BY MOUTH ONCE DAILY., Disp: 30 tablet, Rfl: 5 .  pantoprazole (PROTONIX) 40 MG tablet, TAKE ONE TABLET BY MOUTH DAILY 30 TO 60 MINUTES BEFORE FIRST MEAL OF THE DAY., Disp: 30 tablet, Rfl: 0 .  predniSONE (DELTASONE) 10 MG tablet, Two with bfast daily until better,  Then one daily x 5 days and stop, Disp: 100 tablet, Rfl: 0 .  Respiratory Therapy Supplies (FLUTTER) DEVI, 1 Device by Does not apply  route as needed., Disp: 1 each, Rfl: 0 .  Revefenacin (YUPELRI) 175 MCG/3ML SOLN, Inhale 3 mLs into the lungs daily., Disp: 120 mL, Rfl: 11 .  Roflumilast (DALIRESP) 250 MCG TABS, Take 250 mcg by mouth daily., Disp: 30 tablet, Rfl: 11 .  triamcinolone (NASACORT) 55 MCG/ACT AERO nasal inhaler, Place 2 sprays into the nose daily., Disp: , Rfl:  .  triamterene-hydrochlorothiazide (MAXZIDE-25) 37.5-25 MG tablet, One daily as needed for leg swelling, Disp: 30 tablet, Rfl: 2 .  VENTOLIN HFA 108 (90 Base) MCG/ACT inhaler, INHALE 1 TO 2 PUFFS INTO LUNGS EVERY 4 HOURS AS NEEDED., Disp: 18 g, Rfl: 2  Past Medical History: Past Medical History:  Diagnosis Date  . Arthritis   . Asthma   . COPD (chronic obstructive pulmonary disease) (HCC)   . Hypertension     Tobacco Use: Social History   Tobacco Use  Smoking Status Former Smoker  . Packs/day: 1.50  . Years: 48.00  . Pack years: 72.00  . Types: Cigarettes  . Last attempt to quit: 10/28/2015  . Years since quitting: 2.8  Smokeless Tobacco Never Used    Labs: Recent Review Advice worker    Labs for ITP Cardiac and Pulmonary Rehab Latest Ref Rng & Units 01/13/2016   Hemoglobin A1c <5.7 % 5.3      Capillary Blood Glucose: No results found for: GLUCAP   Pulmonary Assessment Scores: Pulmonary Assessment Scores  Row Name 08/01/18 1145         ADL UCSD   ADL Phase  Entry     SOB Score total  69     Rest  0     Walk  8     Stairs  4     Bath  3     Dress  3     Shop  3       CAT Score   CAT Score  23       mMRC Score   mMRC Score  3        Pulmonary Function Assessment: Pulmonary Function Assessment - 08/01/18 1141      Pulmonary Function Tests   FVC%  52 %    FEV1%  25 %    FEV1/FVC Ratio  48    RV%  211 %    DLCO%  37 %      Initial Spirometry Results   FVC%  52 %    FEV1%  25 %    FEV1/FVC Ratio  48      Post Bronchodilator Spirometry Results   FVC%  56 %    FEV1%  29 %    FEV1/FVC Ratio  52       Breath   Shortness of Breath  Yes;Limiting activity       Exercise Target Goals: Exercise Program Goal: Individual exercise prescription set using results from initial 6 min walk test and THRR while considering  patient's activity barriers and safety.   Exercise Prescription Goal: Initial exercise prescription builds to 30-45 minutes a day of aerobic activity, 2-3 days per week.  Home exercise guidelines will be given to patient during program as part of exercise prescription that the participant will acknowledge.  Activity Barriers & Risk Stratification: Activity Barriers & Cardiac Risk Stratification - 08/01/18 0938      Activity Barriers & Cardiac Risk Stratification   Activity Barriers  Arthritis;Back Problems;Deconditioning;Shortness of Breath;Other (comment)    Comments  neuropathy in feet     Cardiac Risk Stratification  Moderate       6 Minute Walk: 6 Minute Walk    Row Name 08/01/18 0936         6 Minute Walk   Phase  Initial     Distance  700 feet     Walk Time  5.06 minutes     # of Rest Breaks  1     MPH  1.32     METS  2.02     RPE  15     Perceived Dyspnea   14     VO2 Peak  8.68     Symptoms  Yes (comment)     Comments  SOB - had to stop and put O2 on, leg tiredness due to deconditioning.      Resting HR  105 bpm     Resting BP  130/68     Resting Oxygen Saturation   98 %     Exercise Oxygen Saturation  during 6 min walk  86 % after O2 - 94     Max Ex. HR  118 bpm     Max Ex. BP  152/62     2 Minute Post BP  132/70        Oxygen Initial Assessment: Oxygen Initial Assessment - 08/01/18 1135      Home Oxygen   Home Oxygen Device  None    Sleep Oxygen Prescription  None    Home Exercise Oxygen Prescription  None    Home at Rest Exercise Oxygen Prescription  None      Initial 6 min Walk   Oxygen Used  Continuous    Liters per minute  2      Program Oxygen Prescription   Program Oxygen Prescription  Continuous    Liters per minute  2     Comments  Patient may need if SaO2 dropps below 90%      Intervention   Short Term Goals  To learn and demonstrate proper pursed lip breathing techniques or other breathing techniques.;To learn and understand importance of maintaining oxygen saturations>88%    Long  Term Goals  Maintenance of O2 saturations>88%;Exhibits proper breathing techniques, such as pursed lip breathing or other method taught during program session       Oxygen Re-Evaluation: Oxygen Re-Evaluation    Row Name 08/29/18 0808             Program Oxygen Prescription   Program Oxygen Prescription  None       Comments  Patient may need if SaO2 dropps below 90%         Home Oxygen   Home Oxygen Device  None       Sleep Oxygen Prescription  None       Home Exercise Oxygen Prescription  None       Home at Rest Exercise Oxygen Prescription  None       Compliance with Home Oxygen Use  Yes         Goals/Expected Outcomes   Short Term Goals  To learn and demonstrate proper pursed lip breathing techniques or other breathing techniques.;To learn and understand importance of maintaining oxygen saturations>88%       Long  Term Goals  Maintenance of O2 saturations>88%;Exhibits proper breathing techniques, such as pursed lip breathing or other method taught during program session       Comments  Patient has not needed O2 in the program. He is able to properly use the pulse oximetry and is able to verbalize maintaining his O2 saturation >88%. He also is able to properly demonstrate pursed lip breathing during the program. Will continue to monitor for progress.        Goals/Expected Outcomes  Patient will continue to meet his short and long term goals.           Oxygen Discharge (Final Oxygen Re-Evaluation): Oxygen Re-Evaluation - 08/29/18 0808      Program Oxygen Prescription   Program Oxygen Prescription  None    Comments  Patient may need if SaO2 dropps below 90%      Home Oxygen   Home Oxygen Device  None    Sleep  Oxygen Prescription  None    Home Exercise Oxygen Prescription  None    Home at Rest Exercise Oxygen Prescription  None    Compliance with Home Oxygen Use  Yes      Goals/Expected Outcomes   Short Term Goals  To learn and demonstrate proper pursed lip breathing techniques or other breathing techniques.;To learn and understand importance of maintaining oxygen saturations>88%    Long  Term Goals  Maintenance of O2 saturations>88%;Exhibits proper breathing techniques, such as pursed lip breathing or other method taught during program session    Comments  Patient has not needed O2 in the program. He is able to properly use the pulse oximetry and is able to verbalize maintaining his O2 saturation >88%. He  also is able to properly demonstrate pursed lip breathing during the program. Will continue to monitor for progress.     Goals/Expected Outcomes  Patient will continue to meet his short and long term goals.        Initial Exercise Prescription: Initial Exercise Prescription - 08/01/18 0900      Date of Initial Exercise RX and Referring Provider   Date  08/01/18    Referring Provider  [name]    Expected Discharge Date  10/30/18      Oxygen   Oxygen  Continuous    Liters  2   as needed while walking      Treadmill   MPH  0.8    Grade  0    Minutes  17    METs  1.61      Recumbant Elliptical   Level  1    RPM  26    Watts  20    Minutes  17    METs  1      T5 Nustep   Level  1    SPM  40    Minutes  17    METs  1.5      Prescription Details   Frequency (times per week)  2    Duration  Progress to 30 minutes of continuous aerobic without signs/symptoms of physical distress      Intensity   THRR 40-80% of Max Heartrate  [phone]    Ratings of Perceived Exertion  11-13    Perceived Dyspnea  0-4      Progression   Progression  Continue to progress workloads to maintain intensity without signs/symptoms of physical distress.      Resistance Training   Training  Prescription  Yes    Weight  1    Reps  10-15       Perform Capillary Blood Glucose checks as needed.  Exercise Prescription Changes:  Exercise Prescription Changes    Row Name 08/01/18 0900 08/21/18 1400           Response to Exercise   Blood Pressure (Admit)  130/68  122/60      Blood Pressure (Exercise)  152/62  132/62      Blood Pressure (Exit)  132/70  110/72      Heart Rate (Admit)  105 bpm  74 bpm      Heart Rate (Exercise)  118 bpm  94 bpm      Heart Rate (Exit)  86 bpm  83 bpm      Oxygen Saturation (Admit)  98 %  95 %      Oxygen Saturation (Exercise)  86 %  90 %      Oxygen Saturation (Exit)  99 %  92 %      Rating of Perceived Exertion (Exercise)  15  11      Perceived Dyspnea (Exercise)  14  13      Symptoms  SOB  -      Comments  6 minute walk test   -      Duration  -  Continue with 30 min of aerobic exercise without signs/symptoms of physical distress.      Intensity  -  THRR New [phone]        Resistance Training   Training Prescription  -  Yes      Weight  -  1      Reps  -  10-15  Treadmill   MPH  -  0.6 had to turn down in order to breath       Grade  -  0      Minutes  -  17      METs  -  1.45        Recumbant Elliptical   Level  -  1      RPM  -  39      Watts  -  55      Minutes  -  17      METs  -  1         Exercise Comments:  Exercise Comments    Row Name 08/28/18 1507           Exercise Comments  Pt. has attended 8 exercise sessions so far. He is now able to walk on the TM for the full 17 minutes rather than having to stop and sit to catch his breath. We will continue to monitor and progress as we see fit.           Exercise Goals and Review:  Exercise Goals    Row Name 08/01/18 0941             Exercise Goals   Increase Physical Activity  Yes       Intervention  Provide advice, education, support and counseling about physical activity/exercise needs.;Develop an individualized exercise prescription for  aerobic and resistive training based on initial evaluation findings, risk stratification, comorbidities and participant's personal goals.       Expected Outcomes  Short Term: Attend rehab on a regular basis to increase amount of physical activity.       Increase Strength and Stamina  Yes       Intervention  Provide advice, education, support and counseling about physical activity/exercise needs.;Develop an individualized exercise prescription for aerobic and resistive training based on initial evaluation findings, risk stratification, comorbidities and participant's personal goals.       Expected Outcomes  Short Term: Increase workloads from initial exercise prescription for resistance, speed, and METs.       Able to understand and use rate of perceived exertion (RPE) scale  Yes       Intervention  Provide education and explanation on how to use RPE scale       Expected Outcomes  Short Term: Able to use RPE daily in rehab to express subjective intensity level;Long Term:  Able to use RPE to guide intensity level when exercising independently       Able to understand and use Dyspnea scale  Yes       Intervention  Provide education and explanation on how to use Dyspnea scale       Expected Outcomes  Long Term: Able to use Dyspnea scale to guide intensity level when exercising independently;Short Term: Able to use Dyspnea scale daily in rehab to express subjective sense of shortness of breath during exertion       Knowledge and understanding of Target Heart Rate Range (THRR)  Yes       Intervention  Provide education and explanation of THRR including how the numbers were predicted and where they are located for reference       Expected Outcomes  Short Term: Able to state/look up THRR;Short Term: Able to use daily as guideline for intensity in rehab;Long Term: Able to use THRR to govern intensity when exercising independently       Able to check  pulse independently  Yes       Intervention  Provide  education and demonstration on how to check pulse in carotid and radial arteries.;Review the importance of being able to check your own pulse for safety during independent exercise       Expected Outcomes  Short Term: Able to explain why pulse checking is important during independent exercise;Long Term: Able to check pulse independently and accurately       Understanding of Exercise Prescription  Yes       Intervention  Provide education, explanation, and written materials on patient's individual exercise prescription       Expected Outcomes  Short Term: Able to explain program exercise prescription;Long Term: Able to explain home exercise prescription to exercise independently          Exercise Goals Re-Evaluation : Exercise Goals Re-Evaluation    Row Name 08/28/18 1504             Exercise Goal Re-Evaluation   Exercise Goals Review  Increase Physical Activity;Increase Strength and Stamina;Able to understand and use rate of perceived exertion (RPE) scale;Able to understand and use Dyspnea scale;Knowledge and understanding of Target Heart Rate Range (THRR);Able to check pulse independently;Understanding of Exercise Prescription       Comments  Pt. is still fairly new to the program having attended 8 sessions so far. He has reported feeling stronger since starting PR and is excited when he is able to increase his speed on the TM.       Expected Outcomes  increase strength.           Discharge Exercise Prescription (Final Exercise Prescription Changes): Exercise Prescription Changes - 08/21/18 1400      Response to Exercise   Blood Pressure (Admit)  122/60    Blood Pressure (Exercise)  132/62    Blood Pressure (Exit)  110/72    Heart Rate (Admit)  74 bpm    Heart Rate (Exercise)  94 bpm    Heart Rate (Exit)  83 bpm    Oxygen Saturation (Admit)  95 %    Oxygen Saturation (Exercise)  90 %    Oxygen Saturation (Exit)  92 %    Rating of Perceived Exertion (Exercise)  11    Perceived  Dyspnea (Exercise)  13    Duration  Continue with 30 min of aerobic exercise without signs/symptoms of physical distress.    Intensity  THRR New   [phone]     Resistance Training   Training Prescription  Yes    Weight  1    Reps  10-15      Treadmill   MPH  0.6   had to turn down in order to breath    Grade  0    Minutes  17    METs  1.45      Recumbant Elliptical   Level  1    RPM  39    Watts  55    Minutes  17    METs  1       Nutrition:  Target Goals: Understanding of nutrition guidelines, daily intake of sodium 1500mg , cholesterol 200mg , calories 30% from fat and 7% or less from saturated fats, daily to have 5 or more servings of fruits and vegetables.  Biometrics: Pre Biometrics - 08/01/18 0942      Pre Biometrics   Height   (1.88 m)    Waist Circumference  39 inches    Hip Circumference  40 inches  Waist to Hip Ratio  0.98 %    Triceps Skinfold  7 mm    % Body Fat  22.7 %    Grip Strength  7.4 kg    Flexibility  0 in   cracked vertebrae    Single Leg Stand  6 seconds        Nutrition Therapy Plan and Nutrition Goals: Nutrition Therapy & Goals - 08/29/18 0811      Nutrition Therapy   RD appointment deferred  Yes      Personal Nutrition Goals   Comments  Patient was reminded about RD meeting today. Will continue to monitor for progress.       Intervention Plan   Intervention  Nutrition handout(s) given to patient.       Nutrition Assessments: Nutrition Assessments - 08/01/18 1150      MEDFICTS Scores   Pre Score  74       Nutrition Goals Re-Evaluation:   Nutrition Goals Discharge (Final Nutrition Goals Re-Evaluation):   Psychosocial: Target Goals: Acknowledge presence or absence of significant depression and/or stress, maximize coping skills, provide positive support system. Participant is able to verbalize types and ability to use techniques and skills needed for reducing stress and depression.  Initial Review &  Psychosocial Screening: Initial Psych Review & Screening - 08/01/18 1147      Initial Review   Current issues with  None Identified      Family Dynamics   Good Support System?  Yes      Barriers   Psychosocial barriers to participate in program  There are no identifiable barriers or psychosocial needs.      Screening Interventions   Interventions  Encouraged to exercise    Expected Outcomes  Short Term goal: Identification and review with participant of any Quality of Life or Depression concerns found by scoring the questionnaire.;Long Term goal: The participant improves quality of Life and PHQ9 Scores as seen by post scores and/or verbalization of changes       Quality of Life Scores: Quality of Life - 08/01/18 0943      Quality of Life   Select  Quality of Life      Quality of Life Scores   Health/Function Pre  9.2 %    Socioeconomic Pre  29 %    Psych/Spiritual Pre  16.29 %    Family Pre  27.6 %    GLOBAL Pre  17.09 %      Scores of 19 and below usually indicate a poorer quality of life in these areas.  A difference of  2-3 points is a clinically meaningful difference.  A difference of 2-3 points in the total score of the Quality of Life Index has been associated with significant improvement in overall quality of life, self-image, physical symptoms, and general health in studies assessing change in quality of life.   PHQ-9: Recent Review Flowsheet Data    Depression screen [facility] 2/9 08/01/2018 10/18/2017 07/11/2017 05/23/2017 02/19/2017   Decreased Interest 0 0 0 2 0   Down, Depressed, Hopeless 0 0 0 1 2   PHQ - 2 Score 0 0 0 3 2   Altered sleeping 0 - 0 0 0   Tired, decreased energy 1 - 0 1 1   Change in appetite 2 - 0 0 0   Feeling bad or failure about yourself  0 - 0 1 1   Trouble concentrating 0 - 0 0 0   Moving slowly or fidgety/restless 0 -  0 0 1   Suicidal thoughts 0 - 0 0 0   PHQ-9 Score 3 - 0 5 5   Difficult doing work/chores Somewhat difficult - Not difficult at  all Somewhat difficult Somewhat difficult     Interpretation of Total Score  Total Score Depression Severity:  1-4 = Minimal depression, 5-9 = Mild depression, 10-14 = Moderate depression, 15-19 = Moderately severe depression, 20-27 = Severe depression   Psychosocial Evaluation and Intervention: Psychosocial Evaluation - 08/01/18 1150      Psychosocial Evaluation & Interventions   Interventions  Encouraged to exercise with the program and follow exercise prescription    Continue Psychosocial Services   No Follow up required       Psychosocial Re-Evaluation: Psychosocial Re-Evaluation    Row Name 08/29/18 0814             Psychosocial Re-Evaluation   Current issues with  None Identified       Comments  Patient's initial QOL score was 17.09 and his PHQ-9 score was 3 with no psychosocial issues identified.        Expected Outcomes  Patient will have no psychosocial issues identified at discharge.        Interventions  Relaxation education;Stress management education;Encouraged to attend Pulmonary Rehabilitation for the exercise       Continue Psychosocial Services   No Follow up required          Psychosocial Discharge (Final Psychosocial Re-Evaluation): Psychosocial Re-Evaluation - 08/29/18 2706      Psychosocial Re-Evaluation   Current issues with  None Identified    Comments  Patient's initial QOL score was 17.09 and his PHQ-9 score was 3 with no psychosocial issues identified.     Expected Outcomes  Patient will have no psychosocial issues identified at discharge.     Interventions  Relaxation education;Stress management education;Encouraged to attend Pulmonary Rehabilitation for the exercise    Continue Psychosocial Services   No Follow up required        Education: Education Goals: Education classes will be provided on a weekly basis, covering required topics. Participant will state understanding/return demonstration of topics presented.  Learning  Barriers/Preferences: Learning Barriers/Preferences - 08/01/18 1009      Learning Barriers/Preferences   Learning Barriers  None    Learning Preferences  Skilled Demonstration;Individual Instruction;Group Instruction       Education Topics: How Lungs Work and Diseases: - Discuss the anatomy of the lungs and diseases that can affect the lungs, such as COPD.   Exercise: -Discuss the importance of exercise, FITT principles of exercise, normal and abnormal responses to exercise, and how to exercise safely.   Environmental Irritants: -Discuss types of environmental irritants and how to limit exposure to environmental irritants.   PULMONARY REHAB CHRONIC OBSTRUCTIVE PULMONARY DISEASE from 08/22/2018 in [facility]  Date  08/08/18  Educator  Laural Benes  Instruction Review Code  2- Demonstrated Understanding      Meds/Inhalers and oxygen: - Discuss respiratory medications, definition of an inhaler and oxygen, and the proper way to use an inhaler and oxygen.   PULMONARY REHAB CHRONIC OBSTRUCTIVE PULMONARY DISEASE from 08/22/2018 in [facility]  Date  08/15/18  Educator  Laural Benes       Energy Saving Techniques: - Discuss methods to conserve energy and decrease shortness of breath when performing activities of daily living.    PULMONARY REHAB CHRONIC OBSTRUCTIVE PULMONARY DISEASE from 08/22/2018 in [facility]IAC REHABIL[facility]  Date  08/22/18  Educator  Coad  Instruction Review Code  2- Demonstrated Understanding      Bronchial Hygiene / Breathing Techniques: - Discuss breathing mechanics, pursed-lip breathing technique,  proper posture, effective ways to clear airways, and other functional breathing techniques   Cleaning Equipment: - Provides group verbal and written instruction about the health risks of elevated stress, cause of high stress, and healthy ways to reduce stress.   Nutrition I: Fats: - Discuss the types of cholesterol,  what cholesterol does to the body, and how cholesterol levels can be controlled.   Nutrition II: Labels: -Discuss the different components of food labels and how to read food labels.   Respiratory Infections: - Discuss the signs and symptoms of respiratory infections, ways to prevent respiratory infections, and the importance of seeking medical treatment when having a respiratory infection.   Stress I: Signs and Symptoms: - Discuss the causes of stress, how stress may lead to anxiety and depression, and ways to limit stress.   Stress II: Relaxation: -Discuss relaxation techniques to limit stress.   Oxygen for Home/Travel: - Discuss how to prepare for travel when on oxygen and proper ways to transport and store oxygen to ensure safety.   Knowledge Questionnaire Score: Knowledge Questionnaire Score - 08/01/18 1151      Knowledge Questionnaire Score   Pre Score  14/18       Core Components/Risk Factors/Patient Goals at Admission: Personal Goals and Risk Factors at Admission - 08/01/18 1151      Core Components/Risk Factors/Patient Goals on Admission    Weight Management  Weight Maintenance    Personal Goal Other  Yes    Personal Goal  Patient wants to breathe better, get stronger, be able to sing on the choir.     Intervention  Attend PR 2 x week and supplement with at home exercise 3 x week.     Expected Outcomes  Reach personal goals       Core Components/Risk Factors/Patient Goals Review:  Goals and Risk Factor Review    Row Name 08/29/18 0812             Core Components/Risk Factors/Patient Goals Review   Personal Goals Review  Weight Management/Obesity;Improve shortness of breath with ADL's Breathe better; get stronger; be able to sing in choir.        Review  Patient has completed 8 sessions maintaining his weight since his orientation visit. He is doing well in the program with some progression. He states his breathing has improved and he is now able to shower  without having to use his MDI multiple times. He also states he is ablet to put out his trash now which he has not been able to do in a long time. He is very pleased with his progress so far. Will continue to monitor for progress.        Expected Outcomes  Patient will continue to attend sessions and complete the program meeting his personal goals.           Core Components/Risk Factors/Patient Goals at Discharge (Final Review):  Goals and Risk Factor Review - 08/29/18 0812      Core Components/Risk Factors/Patient Goals Review   Personal Goals Review  Weight Management/Obesity;Improve shortness of breath with ADL's   Breathe better; get stronger; be able to sing in choir.    Review  Patient has completed 8 sessions maintaining his weight since his orientation visit. He is doing well in the program with some progression. He states his breathing  has improved and he is now able to shower without having to use his MDI multiple times. He also states he is ablet to put out his trash now which he has not been able to do in a long time. He is very pleased with his progress so far. Will continue to monitor for progress.     Expected Outcomes  Patient will continue to attend sessions and complete the program meeting his personal goals.        ITP Comments:   Comments: ITP REVIEW Pt is making expected progress toward pulmonary rehab goals after completing 8 sessions. Recommend continued exercise, life style modification, education, and utilization of breathing techniques to increase stamina and strength and decrease shortness of breath with exertion.

## 2018-09-02 ENCOUNTER — Telehealth: Payer: Self-pay | Admitting: Allergy & Immunology

## 2018-09-02 MED ORDER — CETIRIZINE HCL 10 MG PO TABS: 10.0000 mg | ORAL_TABLET | Freq: Every day | ORAL | 5 refills | Status: AC

## 2018-09-02 NOTE — Telephone Encounter (Signed)
Called patient spoke to wife advised we would send in the zyrtec as written on his plan but if insurance does not pay for it he should get otc. Wife verbalized understanding and will tell patient

## 2018-09-02 NOTE — Telephone Encounter (Signed)
Patient would like a script sent in for ZYRTEC to [facility] Patient used samples and they worked well Please call with any questions

## 2018-09-03 ENCOUNTER — Encounter (HOSPITAL_COMMUNITY)
Admission: RE | Admit: 2018-09-03 | Discharge: 2018-09-03 | Disposition: A | Discharge status: Home / Self Care | Payer: Medicare Other | Source: Ambulatory Visit | Attending: Internal Medicine | Admitting: Internal Medicine

## 2018-09-03 DIAGNOSIS — J449 Chronic obstructive pulmonary disease, unspecified: Secondary | ICD-10-CM

## 2018-09-03 NOTE — Progress Notes (Signed)
Daily Session Note  Patient Details  Name: Eric Spencer MRN: [id number] Date of Birth: 1945/11/16 Referring Provider:     PULMONARY REHAB COPD ORIENTATION from 08/01/2018 in [place]  Referring Provider  [name]      Encounter Date: 09/03/2018  Check In: Session Check In - 09/03/18 1330      Check-In   Supervising physician immediately available to respond to emergencies  See telemetry face sheet for immediately available MD    Location  [facility]    Staff Present  Russella Dar, MS, EP, [facility], Exercise Physiologist;Naryiah Schley Zachery Conch, Exercise Physiologist;Other    Medication changes reported      No    Fall or balance concerns reported     No    Warm-up and Cool-down  Performed as group-led instruction    Resistance Training Performed  Yes    VAD Patient?  No    PAD/SET Patient?  No      Pain Assessment   Currently in Pain?  No/denies    Pain Score  0-No pain    Multiple Pain Sites  No       Capillary Blood Glucose: No results found for this or any previous visit (from the past 24 hour(s)).    Social History   Tobacco Use  Smoking Status Former Smoker  . Packs/day: 1.50  . Years: 48.00  . Pack years: 72.00  . Types: Cigarettes  . Last attempt to quit: 10/28/2015  . Years since quitting: 2.8  Smokeless Tobacco Never Used    Goals Met:  Proper associated with RPD/PD & O2 Sat Independence with exercise equipment Improved SOB with ADL's Using PLB without cueing & demonstrates good technique Exercise tolerated well No report of cardiac concerns or symptoms Strength training completed today  Goals Unmet:  Not Applicable  Comments: Pt able to follow exercise prescription today without complaint.  Will continue to monitor for progression. Check out 1430.   Dr. Sinda Du is Medical Director for [facility] Pulmonary Re[facility].

## 2018-09-05 ENCOUNTER — Other Ambulatory Visit: Payer: Self-pay

## 2018-09-05 ENCOUNTER — Encounter (HOSPITAL_COMMUNITY)
Admission: RE | Admit: 2018-09-05 | Discharge: 2018-09-05 | Disposition: A | Discharge status: Home / Self Care | Payer: Medicare Other | Source: Ambulatory Visit | Attending: Internal Medicine | Admitting: Internal Medicine

## 2018-09-05 DIAGNOSIS — J449 Chronic obstructive pulmonary disease, unspecified: Secondary | ICD-10-CM

## 2018-09-05 NOTE — Progress Notes (Signed)
Patient ID: Eric Spencer, male   DOB: 11-Aug-1945, 73 y.o.   MRN: [id number] He came in for 30 day reevaluation for the pulmonary rehab program. He is participating and has found it helpful. No barriers to continued participation

## 2018-09-05 NOTE — Progress Notes (Signed)
Daily Session Note  Patient Details  Name: Eric Spencer MRN: [id number] Date of Birth: 04-12-1946 Referring Provider:     PULMONARY REHAB COPD ORIENTATION from 08/01/2018 in [place]  Referring Provider  [name]      Encounter Date: 09/05/2018  Check In: Session Check In - 09/05/18 0900      Check-In   Supervising physician immediately available to respond to emergencies  See telemetry face sheet for immediately available MD    Location  [facility]    Staff Present  Russella Dar, MS, EP, [facility], Exercise Physiologist;Amanda Zachery Conch, Exercise Physiologist;Kenyon Eshleman Wynetta Emery, RN, BSN    Medication changes reported      No    Fall or balance concerns reported     No    Warm-up and Cool-down  Performed as group-led instruction    Resistance Training Performed  Yes    VAD Patient?  No    PAD/SET Patient?  No      Pain Assessment   Currently in Pain?  No/denies    Pain Score  0-No pain    Multiple Pain Sites  No       Capillary Blood Glucose: No results found for this or any previous visit (from the past 24 hour(s)).    Social History   Tobacco Use  Smoking Status Former Smoker  . Packs/day: 1.50  . Years: 48.00  . Pack years: 72.00  . Types: Cigarettes  . Last attempt to quit: 10/28/2015  . Years since quitting: 2.8  Smokeless Tobacco Never Used    Goals Met:  Proper associated with RPD/PD & O2 Sat Independence with exercise equipment Improved SOB with ADL's Using PLB without cueing & demonstrates good technique Exercise tolerated well No report of cardiac concerns or symptoms Strength training completed today  Goals Unmet:  Not Applicable  Comments: Pt able to follow exercise prescription today without complaint.  Will continue to monitor for progression. Check out 1000.   Dr. Sinda Du is Medical Director for [facility] Re[facility].

## 2018-09-09 ENCOUNTER — Telehealth: Payer: Self-pay | Admitting: Adult Health

## 2018-09-09 NOTE — Telephone Encounter (Signed)
Hold on to Zpack., mostly likely allergies .  Use zyrtec , saline and nasacort  mucinex dm if needed for cough .   Make sure not fever , travel .   Call back if worse , will discuss next steps   Please contact office for sooner follow up if symptoms do not improve or worsen or seek emergency care

## 2018-09-09 NOTE — Telephone Encounter (Signed)
Spoke with pt and notified of recs per TP  Again, no travel or fever  Pt will back if not improving

## 2018-09-09 NOTE — Telephone Encounter (Signed)
Spoke with the pt  He c/o nasal congestion and cough with clear sputum "head stopped up" He worked out in his yard and feels this is coming from allergies He denies any recent travel, sick contacts, fever, chills, sweats, CP, chest tightness, increased SOB  He is taking saline NS, nasacort, and zyrtec  He has zpack on hand and wants to know if this would help Please advise thanks

## 2018-09-10 ENCOUNTER — Encounter (HOSPITAL_COMMUNITY): Payer: Medicare Other

## 2018-09-12 ENCOUNTER — Encounter (HOSPITAL_COMMUNITY): Payer: Medicare Other

## 2018-09-13 ENCOUNTER — Ambulatory Visit: Payer: Medicare Other | Admitting: Podiatry

## 2018-09-16 NOTE — Progress Notes (Signed)
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  albuterol (PROVENTIL) (2.5 MG/3ML) 0.083% nebulizer solution, Take 3 mLs (2.5 mg total) by nebulization every 6 (six) hours as needed for wheezing or shortness of breath., Disp: 150 mL, Rfl: 1 .  amLODipine (NORVASC) 10 MG tablet, TAKE 1 TABLET BY MOUTH ONCE DAILY., Disp: 90 tablet, Rfl: 0 .  arformoterol (BROVANA) 15 MCG/2ML NEBU, Take 2 mLs (15 mcg total) by nebulization 2 (two) times daily., Disp: 120 mL, Rfl: 11 .  bisoprolol (ZEBETA) 5 MG tablet, Take 1 tablet (5 mg total) by mouth daily., Disp: 30 tablet, Rfl: 11 .  budesonide (PULMICORT) 0.25 MG/2ML nebulizer solution, One twice daily with brovana, Disp: 120 mL, Rfl: 12 .  cetirizine (ZYRTEC) 10 MG tablet, Take 1 tablet (10 mg total) by mouth daily., Disp: 30 tablet, Rfl: 5 .  dextromethorphan-guaiFENesin (MUCINEX DM) 30-600 MG 12hr tablet, Take 1 tablet by mouth 2 (two) times daily., Disp: , Rfl:  .  famotidine (PEPCID) 20 MG tablet, One at bedtime, Disp: 30 tablet, Rfl: 11 .  ibuprofen (ADVIL,MOTRIN) 200 MG tablet, Take 200 mg by mouth every 6 (six) hours as needed., Disp: , Rfl:  .  montelukast (SINGULAIR) 10 MG tablet, TAKE 1 TABLET BY MOUTH ONCE DAILY., Disp: 30 tablet, Rfl: 5 .  pantoprazole (PROTONIX) 40 MG tablet, TAKE ONE TABLET BY MOUTH DAILY 30 TO 60 MINUTES BEFORE FIRST MEAL OF THE DAY., Disp: 30 tablet, Rfl: 0 .  predniSONE (DELTASONE) 10 MG tablet, Two with bfast daily until better,  Then one daily x 5 days and  stop, Disp: 100 tablet, Rfl: 0 .  Respiratory Therapy Supplies (FLUTTER) DEVI, 1 Device by Does not apply route as needed., Disp: 1 each, Rfl: 0 .  Revefenacin (YUPELRI) 175 MCG/3ML SOLN, Inhale 3 mLs into the lungs daily., Disp: 120 mL, Rfl: 11 .  Roflumilast (DALIRESP) 250 MCG TABS, Take 250 mcg by mouth daily., Disp: 30 tablet, Rfl: 11 .  triamcinolone (NASACORT) 55 MCG/ACT AERO nasal inhaler, Place 2 sprays into the nose daily., Disp: , Rfl:  .  triamterene-hydrochlorothiazide (MAXZIDE-25) 37.5-25 MG tablet, One daily as needed for leg swelling, Disp: 30 tablet, Rfl: 2 .  VENTOLIN HFA 108 (90 Base) MCG/ACT inhaler, INHALE 1 TO 2 PUFFS INTO LUNGS EVERY 4 HOURS AS NEEDED., Disp: 18 g, Rfl: 2  Past Medical History: Past Medical History:  Diagnosis Date  . Arthritis   . Asthma   . COPD (chronic obstructive pulmonary disease) ([place])   . Hypertension     Tobacco Use: Social History   Tobacco Use  Smoking Status Former Smoker  . Packs/day: 1.50  . Years: 48.00  . Pack years: 72.00  . Types: Cigarettes  . Last attempt to quit: 10/28/2015  . Years since quitting: 2.8  Smokeless Tobacco Never Used    Labs: Recent Review Scientist, physiological    Labs for ITP Cardiac and Pulmonary Rehab Latest Ref Rng & Units 01/13/2016   Hemoglobin A1c <5.7 % 5.3  Capillary Blood Glucose: No results found for: GLUCAP   Pulmonary Assessment Scores: Pulmonary Assessment Scores    Row Name 08/01/18 1145         ADL UCSD   ADL Phase  Entry     SOB Score total  69     Rest  0     Walk  8     Stairs  4     Bath  3     Dress  3     Shop  3       CAT Score   CAT Score  23       mMRC Score   mMRC Score  3        Pulmonary Function Assessment: Pulmonary Function Assessment - 08/01/18 1141      Pulmonary Function Tests   FVC%  52 %    FEV1%  25 %    FEV1/FVC Ratio  48    RV%  211 %    DLCO%  37 %      Initial Spirometry Results   FVC%  52 %    FEV1%  25 %    FEV1/FVC Ratio  48       Post Bronchodilator Spirometry Results   FVC%  56 %    FEV1%  29 %    FEV1/FVC Ratio  52      Breath   Shortness of Breath  Yes;Limiting activity       Exercise Target Goals: Exercise Program Goal: Individual exercise prescription set using results from initial 6 min walk test and THRR while considering  patient's activity barriers and safety.   Exercise Prescription Goal: Initial exercise prescription builds to 30-45 minutes a day of aerobic activity, 2-3 days per week.  Home exercise guidelines will be given to patient during program as part of exercise prescription that the participant will acknowledge.  Activity Barriers & Risk Stratification: Activity Barriers & Cardiac Risk Stratification - 08/01/18 0938      Activity Barriers & Cardiac Risk Stratification   Activity Barriers  Arthritis;Back Problems;Deconditioning;Shortness of Breath;Other (comment)    Comments  neuropathy in feet     Cardiac Risk Stratification  Moderate       6 Minute Walk: 6 Minute Walk    Row Name 08/01/18 0936         6 Minute Walk   Phase  Initial     Distance  700 feet     Walk Time  5.06 minutes     # of Rest Breaks  1     MPH  1.32     METS  2.02     RPE  15     Perceived Dyspnea   14     VO2 Peak  8.68     Symptoms  Yes (comment)     Comments  SOB - had to stop and put O2 on, leg tiredness due to deconditioning.      Resting HR  105 bpm     Resting BP  130/68     Resting Oxygen Saturation   98 %     Exercise Oxygen Saturation  during 6 min walk  86 % after O2 - 94     Max Ex. HR  118 bpm     Max Ex. BP  152/62     2 Minute Post BP  132/70        Oxygen Initial Assessment: Oxygen Initial Assessment - 08/01/18 1135  Home Oxygen   Home Oxygen Device  None    Sleep Oxygen Prescription  None    Home Exercise Oxygen Prescription  None    Home at Rest Exercise Oxygen Prescription  None      Initial 6 min Walk   Oxygen Used  Continuous    Liters per minute  2       Program Oxygen Prescription   Program Oxygen Prescription  Continuous    Liters per minute  2    Comments  Patient may need if SaO2 dropps below 90%      Intervention   Short Term Goals  To learn and demonstrate proper pursed lip breathing techniques or other breathing techniques.;To learn and understand importance of maintaining oxygen saturations>88%    Long  Term Goals  Maintenance of O2 saturations>88%;Exhibits proper breathing techniques, such as pursed lip breathing or other method taught during program session       Oxygen Re-Evaluation: Oxygen Re-Evaluation    Row Name 08/29/18 0808 09/16/18 1406           Program Oxygen Prescription   Program Oxygen Prescription  None  None      Liters per minute  -  2      Comments  Patient may need if SaO2 dropps below 90%  Patient may need if SaO2 dropps below 90%        Home Oxygen   Home Oxygen Device  None  None      Sleep Oxygen Prescription  None  None      Home Exercise Oxygen Prescription  None  None      Home at Rest Exercise Oxygen Prescription  None  None      Compliance with Home Oxygen Use  Yes  - N/A        Goals/Expected Outcomes   Short Term Goals  To learn and demonstrate proper pursed lip breathing techniques or other breathing techniques.;To learn and understand importance of maintaining oxygen saturations>88%  To learn and demonstrate proper pursed lip breathing techniques or other breathing techniques.;To learn and understand importance of maintaining oxygen saturations>88%      Long  Term Goals  Maintenance of O2 saturations>88%;Exhibits proper breathing techniques, such as pursed lip breathing or other method taught during program session  Maintenance of O2 saturations>88%;Exhibits proper breathing techniques, such as pursed lip breathing or other method taught during program session      Comments  Patient has not needed O2 in the program. He is able to properly use the pulse oximetry and is able to verbalize  maintaining his O2 saturation >88%. He also is able to properly demonstrate pursed lip breathing during the program. Will continue to monitor for progress.   Patient has not needed O2 in the program. He is able to properly use the pulse oximetry and is able to verbalize maintaining his O2 saturation >88%. He also is able to properly demonstrate pursed lip breathing during the program. Will continue to monitor for progress.       Goals/Expected Outcomes  Patient will continue to meet his short and long term goals.   Patient will continue to meet his short and long term goals.          Oxygen Discharge (Final Oxygen Re-Evaluation): Oxygen Re-Evaluation - 09/16/18 1406      Program Oxygen Prescription   Program Oxygen Prescription  None    Liters per minute  2    Comments  Patient may need if  SaO2 dropps below 90%      Home Oxygen   Home Oxygen Device  None    Sleep Oxygen Prescription  None    Home Exercise Oxygen Prescription  None    Home at Rest Exercise Oxygen Prescription  None    Compliance with Home Oxygen Use  --   N/A     Goals/Expected Outcomes   Short Term Goals  To learn and demonstrate proper pursed lip breathing techniques or other breathing techniques.;To learn and understand importance of maintaining oxygen saturations>88%    Long  Term Goals  Maintenance of O2 saturations>88%;Exhibits proper breathing techniques, such as pursed lip breathing or other method taught during program session    Comments  Patient has not needed O2 in the program. He is able to properly use the pulse oximetry and is able to verbalize maintaining his O2 saturation >88%. He also is able to properly demonstrate pursed lip breathing during the program. Will continue to monitor for progress.     Goals/Expected Outcomes  Patient will continue to meet his short and long term goals.        Initial Exercise Prescription: Initial Exercise Prescription - 08/01/18 0900      Date of Initial Exercise RX  and Referring Provider   Date  08/01/18    Referring Provider  [name]    Expected Discharge Date  10/30/18      Oxygen   Oxygen  Continuous    Liters  2   as needed while walking      Treadmill   MPH  0.8    Grade  0    Minutes  17    METs  1.61      Recumbant Elliptical   Level  1    RPM  26    Watts  20    Minutes  17    METs  1      T5 Nustep   Level  1    SPM  40    Minutes  17    METs  1.5      Prescription Details   Frequency (times per week)  2    Duration  Progress to 30 minutes of continuous aerobic without signs/symptoms of physical distress      Intensity   THRR 40-80% of Max Heartrate  [phone]    Ratings of Perceived Exertion  11-13    Perceived Dyspnea  0-4      Progression   Progression  Continue to progress workloads to maintain intensity without signs/symptoms of physical distress.      Resistance Training   Training Prescription  Yes    Weight  1    Reps  10-15       Perform Capillary Blood Glucose checks as needed.  Exercise Prescription Changes:  Exercise Prescription Changes    Row Name 08/01/18 0900 08/21/18 1400 09/03/18 1000 09/16/18 1000       Response to Exercise   Blood Pressure (Admit)  130/68  122/60  102/56  120/68    Blood Pressure (Exercise)  152/62  132/62  130/64  122/60    Blood Pressure (Exit)  132/70  110/72  110/54  108/64    Heart Rate (Admit)  105 bpm  74 bpm  78 bpm  83 bpm    Heart Rate (Exercise)  118 bpm  94 bpm  80 bpm  89 bpm    Heart Rate (Exit)  86 bpm  83 bpm  82  bpm  75 bpm    Oxygen Saturation (Admit)  98 %  95 %  93 %  93 %    Oxygen Saturation (Exercise)  86 %  90 %  96 %  93 %    Oxygen Saturation (Exit)  99 %  92 %  95 %  96 %    Rating of Perceived Exertion (Exercise)  _0 Perceived Dyspnea (Exercise)  _1 Symptoms  SOB  -  -  -    Comments  6 minute walk test   -  -  -    Duration  -  Continue with 30 min of aerobic exercise without signs/symptoms of physical  distress.  Continue with 30 min of aerobic exercise without signs/symptoms of physical distress.  Continue with 30 min of aerobic exercise without signs/symptoms of physical distress.    Intensity  -  THRR New [phone]  THRR unchanged  THRR unchanged      Resistance Training   Training Prescription  -  Yes  Yes  Yes    Weight  -  _2 Reps  -  10-15  10-15  10-15      Treadmill   MPH  -  0.6 had to turn down in order to breath   0.8  0.9    Grade  -  0  0  0    Minutes  -  _3 METs  -  1.45  1.61  1.69      Recumbant Elliptical   Level  -  _4 RPM  -  39  45  47    Watts  -  55  59  18    Minutes  -  _5 METs  -  _6 Home Exercise Plan   Plans to continue exercise at  -  -  -  Home (comment)    Frequency  -  -  -  Add 3 additional days to program exercise sessions.    Initial Home Exercises Provided  -  -  -  08/01/18       Exercise Comments:  Exercise Comments    Row Name 08/28/18 1507 09/16/18 1102         Exercise Comments  Pt. has attended 8 exercise sessions so far. He is now able to walk on the TM for the full 17 minutes rather than having to stop and sit to catch his breath. We will continue to monitor and progress as we see fit.   Pt. has attended 11 sessions so far. He is still making it 17 minutes on the TM and is going from 0.9-1.0 MPH. We will continue to monitor and progress as tolerated.          Exercise Goals and Review:  Exercise Goals    Row Name 08/01/18 0941             Exercise Goals   Increase Physical Activity  Yes       Intervention  Provide advice, education, support and counseling about physical activity/exercise needs.;Develop an individualized exercise prescription for aerobic and resistive training based on initial evaluation findings, risk stratification, comorbidities and participant's personal goals.  Expected Outcomes  Short Term: Attend rehab on a regular basis to increase amount of  physical activity.       Increase Strength and Stamina  Yes       Intervention  Provide advice, education, support and counseling about physical activity/exercise needs.;Develop an individualized exercise prescription for aerobic and resistive training based on initial evaluation findings, risk stratification, comorbidities and participant's personal goals.       Expected Outcomes  Short Term: Increase workloads from initial exercise prescription for resistance, speed, and METs.       Able to understand and use rate of perceived exertion (RPE) scale  Yes       Intervention  Provide education and explanation on how to use RPE scale       Expected Outcomes  Short Term: Able to use RPE daily in rehab to express subjective intensity level;Long Term:  Able to use RPE to guide intensity level when exercising independently       Able to understand and use Dyspnea scale  Yes       Intervention  Provide education and explanation on how to use Dyspnea scale       Expected Outcomes  Long Term: Able to use Dyspnea scale to guide intensity level when exercising independently;Short Term: Able to use Dyspnea scale daily in rehab to express subjective sense of shortness of breath during exertion       Knowledge and understanding of Target Heart Rate Range (THRR)  Yes       Intervention  Provide education and explanation of THRR including how the numbers were predicted and where they are located for reference       Expected Outcomes  Short Term: Able to state/look up THRR;Short Term: Able to use daily as guideline for intensity in rehab;Long Term: Able to use THRR to govern intensity when exercising independently       Able to check pulse independently  Yes       Intervention  Provide education and demonstration on how to check pulse in carotid and radial arteries.;Review the importance of being able to check your own pulse for safety during independent exercise       Expected Outcomes  Short Term: Able to explain why  pulse checking is important during independent exercise;Long Term: Able to check pulse independently and accurately       Understanding of Exercise Prescription  Yes       Intervention  Provide education, explanation, and written materials on patient's individual exercise prescription       Expected Outcomes  Short Term: Able to explain program exercise prescription;Long Term: Able to explain home exercise prescription to exercise independently          Exercise Goals Re-Evaluation : Exercise Goals Re-Evaluation    Row Name 08/28/18 1504 09/16/18 1100           Exercise Goal Re-Evaluation   Exercise Goals Review  Increase Physical Activity;Increase Strength and Stamina;Able to understand and use rate of perceived exertion (RPE) scale;Able to understand and use Dyspnea scale;Knowledge and understanding of Target Heart Rate Range (THRR);Able to check pulse independently;Understanding of Exercise Prescription  Increase Physical Activity;Increase Strength and Stamina;Able to understand and use rate of perceived exertion (RPE) scale;Able to understand and use Dyspnea scale;Knowledge and understanding of Target Heart Rate Range (THRR);Able to check pulse independently;Understanding of Exercise Prescription      Comments  Pt. is still fairly new to the program having attended 8 sessions so far.  He has reported feeling stronger since starting PR and is excited when he is able to increase his speed on the TM.  Pt. is doing well in PR. He has attended 11 exercise sesssions. He pushes himself everyday to walk longer on the TM without taking a break. He reports being able to do more around the house since starting the program.       Expected Outcomes  increase strength.   increase strength.          Discharge Exercise Prescription (Final Exercise Prescription Changes): Exercise Prescription Changes - 09/16/18 1000      Response to Exercise   Blood Pressure (Admit)  120/68    Blood Pressure (Exercise)   122/60    Blood Pressure (Exit)  108/64    Heart Rate (Admit)  83 bpm    Heart Rate (Exercise)  89 bpm    Heart Rate (Exit)  75 bpm    Oxygen Saturation (Admit)  93 %    Oxygen Saturation (Exercise)  93 %    Oxygen Saturation (Exit)  96 %    Rating of Perceived Exertion (Exercise)  11    Perceived Dyspnea (Exercise)  11    Duration  Continue with 30 min of aerobic exercise without signs/symptoms of physical distress.    Intensity  THRR unchanged      Resistance Training   Training Prescription  Yes    Weight  2    Reps  10-15      Treadmill   MPH  0.9    Grade  0    Minutes  17    METs  1.69      Recumbant Elliptical   Level  1    RPM  47    Watts  18    Minutes  22    METs  1      Home Exercise Plan   Plans to continue exercise at  Home (comment)    Frequency  Add 3 additional days to program exercise sessions.    Initial Home Exercises Provided  08/01/18       Nutrition:  Target Goals: Understanding of nutrition guidelines, daily intake of sodium <153m, cholesterol <2056m calories 30% from fat and 7% or less from saturated fats, daily to have 5 or more servings of fruits and vegetables.  Biometrics: Pre Biometrics - 08/01/18 [id number]    Pre Biometrics   Height  _0  (1.88 m)    Waist Circumference  39 inches    Hip Circumference  40 inches    Waist to Hip Ratio  0.98 %    Triceps Skinfold  7 mm    % Body Fat  22.7 %    Grip Strength  7.4 kg    Flexibility  0 in   cracked vertebrae    Single Leg Stand  6 seconds        Nutrition Therapy Plan and Nutrition Goals: Nutrition Therapy & Goals - 08/29/18 1258      Personal Nutrition Goals   Nutrition Goal  For heart healthy choices add >50% of whole grains, make half their plate fruits and vegetables. Discuss the difference between starchy vegetables and leafy greens, and how leafy vegetables provide fiber, helps maintain healthy weight, helps control blood glucose, and lowers cholesterol.  Discuss  purchasing fresh or frozen vegetable to reduce sodium and not to add grease, fat or sugar. Consume <18oz of red meat per week. Consume lean cuts of meats  and very little of meats high in sodium and nitrates such as pork and lunch meats. Discussed portion control for all food groups.    Comments  Patient met with RD 08/29/18.       Nutrition Assessments: Nutrition Assessments - 08/01/18 1150      MEDFICTS Scores   Pre Score  74       Nutrition Goals Re-Evaluation: Nutrition Goals Re-Evaluation    Row Name 09/16/18 1407             Goals   Current Weight  203 lb (92.1 kg)       Nutrition Goal  For heart healthy choices add >50% of whole grains, make half their plate fruits and vegetables. Discuss the difference between starchy vegetables and leafy greens, and how leafy vegetables provide fiber, helps maintain healthy weight, helps control blood glucose, and lowers cholesterol.  Discuss purchasing fresh or frozen vegetable to reduce sodium and not to add grease, fat or sugar. Consume <18oz of red meat per week. Consume lean cuts of meats and very little of meats high in sodium and nitrates such as pork and lunch meats. Discussed portion control for all food groups.       Comment  Patient has gained 8 lbs since last 30 day review. He says he has not changed his diet since he started the program and continues to follow a "GERD"diet. Will continue to monitor.        Expected Outcome  Patient will work toward meeting his nutritional goals.           Nutrition Goals Discharge (Final Nutrition Goals Re-Evaluation): Nutrition Goals Re-Evaluation - 09/16/18 1407      Goals   Current Weight  203 lb (92.1 kg)    Nutrition Goal  For heart healthy choices add >50% of whole grains, make half their plate fruits and vegetables. Discuss the difference between starchy vegetables and leafy greens, and how leafy vegetables provide fiber, helps maintain healthy weight, helps control blood glucose, and  lowers cholesterol.  Discuss purchasing fresh or frozen vegetable to reduce sodium and not to add grease, fat or sugar. Consume <18oz of red meat per week. Consume lean cuts of meats and very little of meats high in sodium and nitrates such as pork and lunch meats. Discussed portion control for all food groups.    Comment  Patient has gained 8 lbs since last 30 day review. He says he has not changed his diet since he started the program and continues to follow a "GERD"diet. Will continue to monitor.     Expected Outcome  Patient will work toward meeting his nutritional goals.        Psychosocial: Target Goals: Acknowledge presence or absence of significant depression and/or stress, maximize coping skills, provide positive support system. Participant is able to verbalize types and ability to use techniques and skills needed for reducing stress and depression.  Initial Review & Psychosocial Screening: Initial Psych Review & Screening - 08/01/18 1147      Initial Review   Current issues with  None Identified      Family Dynamics   Good Support System?  Yes      Barriers   Psychosocial barriers to participate in program  There are no identifiable barriers or psychosocial needs.      Screening Interventions   Interventions  Encouraged to exercise    Expected Outcomes  Short Term goal: Identification and review with participant of any Quality of Life  or Depression concerns found by scoring the questionnaire.;Long Term goal: The participant improves quality of Life and PHQ9 Scores as seen by post scores and/or verbalization of changes       Quality of Life Scores: Quality of Life - 08/01/18 0943      Quality of Life   Select  Quality of Life      Quality of Life Scores   Health/Function Pre  9.2 %    Socioeconomic Pre  29 %    Psych/Spiritual Pre  16.29 %    Family Pre  27.6 %    GLOBAL Pre  17.09 %      Scores of 19 and below usually indicate a poorer quality of life in these  areas.  A difference of  2-3 points is a clinically meaningful difference.  A difference of 2-3 points in the total score of the Quality of Life Index has been associated with significant improvement in overall quality of life, self-image, physical symptoms, and general health in studies assessing change in quality of life.   PHQ-9: Recent Review Flowsheet Data    Depression screen [facility] 2/9 08/01/2018 10/18/2017 07/11/2017 05/23/2017 02/19/2017   Decreased Interest 0 0 0 2 0   Down, Depressed, Hopeless 0 0 0 1 2   PHQ - 2 Score 0 0 0 3 2   Altered sleeping 0 - 0 0 0   Tired, decreased energy 1 - 0 1 1   Change in appetite 2 - 0 0 0   Feeling bad or failure about yourself  0 - 0 1 1   Trouble concentrating 0 - 0 0 0   Moving slowly or fidgety/restless 0 - 0 0 1   Suicidal thoughts 0 - 0 0 0   PHQ-9 Score 3 - 0 5 5   Difficult doing work/chores Somewhat difficult - Not difficult at all Somewhat difficult Somewhat difficult     Interpretation of Total Score  Total Score Depression Severity:  1-4 = Minimal depression, 5-9 = Mild depression, 10-14 = Moderate depression, 15-19 = Moderately severe depression, 20-27 = Severe depression   Psychosocial Evaluation and Intervention: Psychosocial Evaluation - 08/01/18 1150      Psychosocial Evaluation & Interventions   Interventions  Encouraged to exercise with the program and follow exercise prescription    Continue Psychosocial Services   No Follow up required       Psychosocial Re-Evaluation: Psychosocial Re-Evaluation    Row Name 08/29/18 0814 09/16/18 1411           Psychosocial Re-Evaluation   Current issues with  None Identified  None Identified      Comments  Patient's initial QOL score was 17.09 and his PHQ-9 score was 3 with no psychosocial issues identified.   Patient's initial QOL score was 17.09 and his PHQ-9 score was 3 with no psychosocial issues identified.       Expected Outcomes  Patient will have no psychosocial issues  identified at discharge.   Patient will have no psychosocial issues identified at discharge.       Interventions  Relaxation education;Stress management education;Encouraged to attend Pulmonary Rehabilitation for the exercise  Relaxation education;Stress management education;Encouraged to attend Pulmonary Rehabilitation for the exercise      Continue Psychosocial Services   No Follow up required  No Follow up required         Psychosocial Discharge (Final Psychosocial Re-Evaluation): Psychosocial Re-Evaluation - 09/16/18 1411      Psychosocial Re-Evaluation  Current issues with  None Identified    Comments  Patient's initial QOL score was 17.09 and his PHQ-9 score was 3 with no psychosocial issues identified.     Expected Outcomes  Patient will have no psychosocial issues identified at discharge.     Interventions  Relaxation education;Stress management education;Encouraged to attend Pulmonary Rehabilitation for the exercise    Continue Psychosocial Services   No Follow up required        Education: Education Goals: Education classes will be provided on a weekly basis, covering required topics. Participant will state understanding/return demonstration of topics presented.  Learning Barriers/Preferences: Learning Barriers/Preferences - 08/01/18 1009      Learning Barriers/Preferences   Learning Barriers  None    Learning Preferences  Skilled Demonstration;Individual Instruction;Group Instruction       Education Topics: How Lungs Work and Diseases: - Discuss the anatomy of the lungs and diseases that can affect the lungs, such as COPD.   Exercise: -Discuss the importance of exercise, FITT principles of exercise, normal and abnormal responses to exercise, and how to exercise safely.   Environmental Irritants: -Discuss types of environmental irritants and how to limit exposure to environmental irritants.   PULMONARY REHAB CHRONIC OBSTRUCTIVE PULMONARY DISEASE from 09/05/2018 in  [place]  Date  08/08/18  Educator  Wynetta Emery  Instruction Review Code  2- Demonstrated Understanding      Meds/Inhalers and oxygen: - Discuss respiratory medications, definition of an inhaler and oxygen, and the proper way to use an inhaler and oxygen.   PULMONARY REHAB CHRONIC OBSTRUCTIVE PULMONARY DISEASE from 09/05/2018 in [place]  Date  08/15/18  Educator  Wynetta Emery       Energy Saving Techniques: - Discuss methods to conserve energy and decrease shortness of breath when performing activities of daily living.    PULMONARY REHAB CHRONIC OBSTRUCTIVE PULMONARY DISEASE from 09/05/2018 in [place]  Date  08/22/18  Educator  C[place]  Instruction Review Code  2- Demonstrated Understanding      Bronchial Hygiene / Breathing Techniques: - Discuss breathing mechanics, pursed-lip breathing technique,  proper posture, effective ways to clear airways, and other functional breathing techniques   PULMONARY REHAB CHRONIC OBSTRUCTIVE PULMONARY DISEASE from 09/05/2018 in [place]  Date  08/29/18  Educator  Wynetta Emery  Instruction Review Code  2- Demonstrated Understanding      Cleaning Equipment: - Provides group verbal and written instruction about the health risks of elevated stress, cause of high stress, and healthy ways to reduce stress.   PULMONARY REHAB CHRONIC OBSTRUCTIVE PULMONARY DISEASE from 09/05/2018 in [place]  Date  09/05/18  Educator  Wynetta Emery  Instruction Review Code  2- Demonstrated Understanding      Nutrition I: Fats: - Discuss the types of cholesterol, what cholesterol does to the body, and how cholesterol levels can be controlled.   Nutrition II: Labels: -Discuss the different components of food labels and how to read food labels.   Respiratory Infections: - Discuss the signs and symptoms of respiratory infections, ways to prevent respiratory  infections, and the importance of seeking medical treatment when having a respiratory infection.   Stress I: Signs and Symptoms: - Discuss the causes of stress, how stress may lead to anxiety and depression, and ways to limit stress.   Stress II: Relaxation: -Discuss relaxation techniques to limit stress.   Oxygen for Home/Travel: - Discuss how to prepare for travel when on oxygen and proper ways to  transport and store oxygen to ensure safety.   Knowledge Questionnaire Score: Knowledge Questionnaire Score - 08/01/18 1151      Knowledge Questionnaire Score   Pre Score  14/18       Core Components/Risk Factors/Patient Goals at Admission: Personal Goals and Risk Factors at Admission - 08/01/18 1151      Core Components/Risk Factors/Patient Goals on Admission    Weight Management  Weight Maintenance    Personal Goal Other  Yes    Personal Goal  Patient wants to breathe better, get stronger, be able to sing on the choir.     Intervention  Attend PR 2 x week and supplement with at home exercise 3 x week.     Expected Outcomes  Reach personal goals       Core Components/Risk Factors/Patient Goals Review:  Goals and Risk Factor Review    Row Name 08/29/18 5277 09/16/18 1409           Core Components/Risk Factors/Patient Goals Review   Personal Goals Review  Weight Management/Obesity;Improve shortness of breath with ADL's Breathe better; get stronger; be able to sing in choir.   Weight Management/Obesity;Improve shortness of breath with ADL's Breathe better; get stronger; be able to sing in choir.       Review  Patient has completed 8 sessions maintaining his weight since his orientation visit. He is doing well in the program with some progression. He states his breathing has improved and he is now able to shower without having to use his MDI multiple times. He also states he is ablet to put out his trash now which he has not been able to do in a long time. He is very pleased with  his progress so far. Will continue to monitor for progress.   Patient has completed 11 sessions gaining 8 lbs since last 30 day review. He continues to do well in the program with progression. He continues to say he is breathing better and using his inhaler less now. He is able to do more things around the house with improved SOB. Outpatient pulmonary rehab services was suspended 09/09/18 due to COVID-19 restrictions. Patient says he plans to buy a treadmill to keep walking and exercising while he is out. Will continue to monitor.       Expected Outcomes  Patient will continue to attend sessions and complete the program meeting his personal goals.   Patient will continue to attend sessions and complete the program meeting his personal goals.          Core Components/Risk Factors/Patient Goals at Discharge (Final Review):  Goals and Risk Factor Review - 09/16/18 1409      Core Components/Risk Factors/Patient Goals Review   Personal Goals Review  Weight Management/Obesity;Improve shortness of breath with ADL's   Breathe better; get stronger; be able to sing in choir.    Review  Patient has completed 11 sessions gaining 8 lbs since last 30 day review. He continues to do well in the program with progression. He continues to say he is breathing better and using his inhaler less now. He is able to do more things around the house with improved SOB. Outpatient pulmonary rehab services was suspended 09/09/18 due to COVID-19 restrictions. Patient says he plans to buy a treadmill to keep walking and exercising while he is out. Will continue to monitor.     Expected Outcomes  Patient will continue to attend sessions and complete the program meeting his personal goals.  ITP Comments: ITP Comments    Row Name 08/29/18 1257 09/16/18 1405         ITP Comments  Patient attended the Family Structure class with hospital chaplian to discuss how their recent event has effected their life.   Pulmonary rehab  services was suspended 09/09/18 due to COVID-19 restrictions. Services will resume when restrictions are lifted.          Comments: ITP REVIEW Pt is making expected progress toward pulmonary rehab goals after completing 11 sessions. Recommend continued exercise, life style modification, education, and utilization of breathing techniques to increase stamina and strength and decrease shortness of breath with exertion.  Pulmonary rehab services was suspended 09/09/18 due to COVID-19 restrictions. Services will resume when restrictions are lifted. Will continue to monitor.

## 2018-09-17 ENCOUNTER — Other Ambulatory Visit: Payer: Self-pay | Admitting: Internal Medicine

## 2018-09-17 ENCOUNTER — Encounter (HOSPITAL_COMMUNITY): Payer: Medicare Other

## 2018-09-19 ENCOUNTER — Encounter (HOSPITAL_COMMUNITY): Payer: Medicare Other

## 2018-09-24 ENCOUNTER — Encounter (HOSPITAL_COMMUNITY): Payer: Medicare Other

## 2018-09-26 ENCOUNTER — Encounter (HOSPITAL_COMMUNITY): Payer: Medicare Other

## 2018-09-30 ENCOUNTER — Telehealth: Payer: Self-pay | Admitting: Adult Health

## 2018-09-30 NOTE — Telephone Encounter (Signed)
Pt states he is returning call.

## 2018-10-01 ENCOUNTER — Encounter (HOSPITAL_COMMUNITY): Payer: Medicare Other

## 2018-10-01 NOTE — Telephone Encounter (Signed)
Patient wanted to make sure that he can take albuterol  With other medication I assured him that's how Tammy p would like him to take it nothing further needed at this time.

## 2018-10-03 ENCOUNTER — Encounter (HOSPITAL_COMMUNITY): Payer: Medicare Other

## 2018-10-03 DIAGNOSIS — M9903 Segmental and somatic dysfunction of lumbar region: Secondary | ICD-10-CM | POA: Diagnosis not present

## 2018-10-03 DIAGNOSIS — M545 Low back pain: Secondary | ICD-10-CM | POA: Diagnosis not present

## 2018-10-07 ENCOUNTER — Encounter: Payer: Medicare Other | Admitting: Adult Health

## 2018-10-07 ENCOUNTER — Telehealth (HOSPITAL_COMMUNITY): Payer: Self-pay | Admitting: *Deleted

## 2018-10-07 NOTE — Telephone Encounter (Signed)
Called to check on patient and to verify e-mail address. Informed patient that we will be sending an exercise video to his e-mail to keep him engaged. Patient said he is not exercising a lot because he is SOB. Will follow up.

## 2018-10-08 ENCOUNTER — Encounter (HOSPITAL_COMMUNITY): Payer: Medicare Other

## 2018-10-10 ENCOUNTER — Encounter (HOSPITAL_COMMUNITY): Payer: Medicare Other

## 2018-10-11 DIAGNOSIS — M9902 Segmental and somatic dysfunction of thoracic region: Secondary | ICD-10-CM | POA: Diagnosis not present

## 2018-10-11 DIAGNOSIS — M546 Pain in thoracic spine: Secondary | ICD-10-CM | POA: Diagnosis not present

## 2018-10-11 DIAGNOSIS — M9901 Segmental and somatic dysfunction of cervical region: Secondary | ICD-10-CM | POA: Diagnosis not present

## 2018-10-11 DIAGNOSIS — M542 Cervicalgia: Secondary | ICD-10-CM | POA: Diagnosis not present

## 2018-10-11 DIAGNOSIS — M25511 Pain in right shoulder: Secondary | ICD-10-CM | POA: Diagnosis not present

## 2018-10-11 DIAGNOSIS — M9903 Segmental and somatic dysfunction of lumbar region: Secondary | ICD-10-CM | POA: Diagnosis not present

## 2018-10-15 ENCOUNTER — Encounter (HOSPITAL_COMMUNITY): Payer: Medicare Other

## 2018-10-16 DIAGNOSIS — M542 Cervicalgia: Secondary | ICD-10-CM | POA: Diagnosis not present

## 2018-10-16 DIAGNOSIS — M9902 Segmental and somatic dysfunction of thoracic region: Secondary | ICD-10-CM | POA: Diagnosis not present

## 2018-10-16 DIAGNOSIS — M9903 Segmental and somatic dysfunction of lumbar region: Secondary | ICD-10-CM | POA: Diagnosis not present

## 2018-10-16 DIAGNOSIS — M25511 Pain in right shoulder: Secondary | ICD-10-CM | POA: Diagnosis not present

## 2018-10-16 DIAGNOSIS — M9901 Segmental and somatic dysfunction of cervical region: Secondary | ICD-10-CM | POA: Diagnosis not present

## 2018-10-16 DIAGNOSIS — M546 Pain in thoracic spine: Secondary | ICD-10-CM | POA: Diagnosis not present

## 2018-10-17 ENCOUNTER — Encounter (HOSPITAL_COMMUNITY): Payer: Medicare Other

## 2018-10-18 ENCOUNTER — Other Ambulatory Visit: Payer: Self-pay | Admitting: Internal Medicine

## 2018-10-18 DIAGNOSIS — J439 Emphysema, unspecified: Secondary | ICD-10-CM

## 2018-10-21 DIAGNOSIS — M9903 Segmental and somatic dysfunction of lumbar region: Secondary | ICD-10-CM | POA: Diagnosis not present

## 2018-10-21 DIAGNOSIS — M25511 Pain in right shoulder: Secondary | ICD-10-CM | POA: Diagnosis not present

## 2018-10-21 DIAGNOSIS — M542 Cervicalgia: Secondary | ICD-10-CM | POA: Diagnosis not present

## 2018-10-21 DIAGNOSIS — M9902 Segmental and somatic dysfunction of thoracic region: Secondary | ICD-10-CM | POA: Diagnosis not present

## 2018-10-21 DIAGNOSIS — M9901 Segmental and somatic dysfunction of cervical region: Secondary | ICD-10-CM | POA: Diagnosis not present

## 2018-10-21 DIAGNOSIS — M546 Pain in thoracic spine: Secondary | ICD-10-CM | POA: Diagnosis not present

## 2018-10-22 ENCOUNTER — Encounter (HOSPITAL_COMMUNITY): Payer: Medicare Other

## 2018-10-24 ENCOUNTER — Encounter (HOSPITAL_COMMUNITY): Payer: Medicare Other

## 2018-10-30 DIAGNOSIS — M25511 Pain in right shoulder: Secondary | ICD-10-CM | POA: Diagnosis not present

## 2018-10-30 DIAGNOSIS — M9903 Segmental and somatic dysfunction of lumbar region: Secondary | ICD-10-CM | POA: Diagnosis not present

## 2018-10-30 DIAGNOSIS — M9902 Segmental and somatic dysfunction of thoracic region: Secondary | ICD-10-CM | POA: Diagnosis not present

## 2018-10-30 DIAGNOSIS — M9901 Segmental and somatic dysfunction of cervical region: Secondary | ICD-10-CM | POA: Diagnosis not present

## 2018-10-30 DIAGNOSIS — M546 Pain in thoracic spine: Secondary | ICD-10-CM | POA: Diagnosis not present

## 2018-10-30 DIAGNOSIS — M542 Cervicalgia: Secondary | ICD-10-CM | POA: Diagnosis not present

## 2018-11-04 DIAGNOSIS — M9903 Segmental and somatic dysfunction of lumbar region: Secondary | ICD-10-CM | POA: Diagnosis not present

## 2018-11-04 DIAGNOSIS — M545 Low back pain: Secondary | ICD-10-CM | POA: Diagnosis not present

## 2018-11-05 ENCOUNTER — Telehealth: Payer: Self-pay | Admitting: Internal Medicine

## 2018-11-05 MED ORDER — CLOTRIMAZOLE 10 MG MT TROC: 10.0000 mg | Freq: Four times a day (QID) | OROMUCOSAL | 2 refills | Status: AC | PRN

## 2018-11-05 MED ORDER — ALBUTEROL SULFATE (2.5 MG/3ML) 0.083% IN NEBU: 2.5000 mg | INHALATION_SOLUTION | Freq: Four times a day (QID) | RESPIRATORY_TRACT | 2 refills | Status: AC | PRN

## 2018-11-05 NOTE — Telephone Encounter (Signed)
Dr. Sherene Sires, please also advise if it is okay to prescribe something for pt to help with the thrush he has.

## 2018-11-05 NOTE — Telephone Encounter (Signed)
Called and spoke with pt. Pt stated he had an Rx of albuterol neb sol that was prescribed by another MD that he is requesting a refill of. Pt also states that he has thrush in his mouth and is requesting to have something sent in to help with this. Dr. Sherene Sires, please advise on this for pt. Thanks!

## 2018-11-05 NOTE — Telephone Encounter (Signed)
Ok to refill but it is strictly if needed as already on brovana bid which is the same drug, though longer acting  Ok to refill x 2

## 2018-11-05 NOTE — Telephone Encounter (Signed)
Called and spoke with pt letting him know that MW was fine for [place] refilling his albuterol neb sol but stated to him that this was only if needed as the brovana neb sol was bid and pt verbalized understanding. Also stated to pt that we were sending Rx in to help with the thrush and pt verbalized understanding. Verified pt's preferred pharmacy and sent both Rx's in for pt.nothing further needed.

## 2018-11-05 NOTE — Telephone Encounter (Signed)
Clotrimazole 10 mg troche #16 one qid prn refill x 2

## 2018-11-06 DIAGNOSIS — M9903 Segmental and somatic dysfunction of lumbar region: Secondary | ICD-10-CM | POA: Diagnosis not present

## 2018-11-06 DIAGNOSIS — M545 Low back pain: Secondary | ICD-10-CM | POA: Diagnosis not present

## 2018-11-08 DIAGNOSIS — M9903 Segmental and somatic dysfunction of lumbar region: Secondary | ICD-10-CM | POA: Diagnosis not present

## 2018-11-08 DIAGNOSIS — M545 Low back pain: Secondary | ICD-10-CM | POA: Diagnosis not present

## 2018-11-11 ENCOUNTER — Other Ambulatory Visit: Payer: Self-pay | Admitting: Internal Medicine

## 2018-11-12 DIAGNOSIS — M9903 Segmental and somatic dysfunction of lumbar region: Secondary | ICD-10-CM | POA: Diagnosis not present

## 2018-11-12 DIAGNOSIS — M545 Low back pain: Secondary | ICD-10-CM | POA: Diagnosis not present

## 2018-11-14 ENCOUNTER — Telehealth: Payer: Self-pay | Admitting: Internal Medicine

## 2018-11-14 NOTE — Telephone Encounter (Signed)
Called and spoke with pt letting him know the info stated by MW and pt expressed understanding. Nothing further needed.

## 2018-11-14 NOTE — Telephone Encounter (Signed)
Yes ok to take both but usually no need for nsaid once starts prednisone

## 2018-11-14 NOTE — Telephone Encounter (Signed)
Called and spoke with pt who stated last time he was prescribed prednisone, he was doing good and after being off of it for two weeks, he stated his symptoms flared up again. Pt stated that he does have a prednisone prescription that he believes he needs to start taking. Pt wanted to know if it would be okay for him to take the prednisone with ibuprofen. Dr. Sherene Sires, please advise on this for pt. Thanks!

## 2018-11-15 ENCOUNTER — Other Ambulatory Visit: Payer: Self-pay

## 2018-11-15 ENCOUNTER — Inpatient Hospital Stay (HOSPITAL_COMMUNITY)
Admission: EM | Admit: 2018-11-15 | Discharge: 2018-11-25 | DRG: 215 | Disposition: E | Payer: Medicare Other | Attending: Cardiology | Admitting: Cardiology

## 2018-11-15 ENCOUNTER — Emergency Department (HOSPITAL_COMMUNITY): Payer: Medicare Other

## 2018-11-15 DIAGNOSIS — Z20828 Contact with and (suspected) exposure to other viral communicable diseases: Secondary | ICD-10-CM | POA: Diagnosis present

## 2018-11-15 DIAGNOSIS — Z823 Family history of stroke: Secondary | ICD-10-CM

## 2018-11-15 DIAGNOSIS — I509 Heart failure, unspecified: Secondary | ICD-10-CM

## 2018-11-15 DIAGNOSIS — N170 Acute kidney failure with tubular necrosis: Secondary | ICD-10-CM | POA: Diagnosis not present

## 2018-11-15 DIAGNOSIS — Z515 Encounter for palliative care: Secondary | ICD-10-CM | POA: Diagnosis not present

## 2018-11-15 DIAGNOSIS — R7989 Other specified abnormal findings of blood chemistry: Secondary | ICD-10-CM | POA: Diagnosis not present

## 2018-11-15 DIAGNOSIS — R0689 Other abnormalities of breathing: Secondary | ICD-10-CM | POA: Diagnosis not present

## 2018-11-15 DIAGNOSIS — N183 Chronic kidney disease, stage 3 (moderate): Secondary | ICD-10-CM | POA: Diagnosis present

## 2018-11-15 DIAGNOSIS — D62 Acute posthemorrhagic anemia: Secondary | ICD-10-CM | POA: Diagnosis not present

## 2018-11-15 DIAGNOSIS — R778 Other specified abnormalities of plasma proteins: Secondary | ICD-10-CM | POA: Diagnosis present

## 2018-11-15 DIAGNOSIS — Z87891 Personal history of nicotine dependence: Secondary | ICD-10-CM

## 2018-11-15 DIAGNOSIS — R9431 Abnormal electrocardiogram [ECG] [EKG]: Secondary | ICD-10-CM | POA: Diagnosis not present

## 2018-11-15 DIAGNOSIS — Z8249 Family history of ischemic heart disease and other diseases of the circulatory system: Secondary | ICD-10-CM

## 2018-11-15 DIAGNOSIS — Z7951 Long term (current) use of inhaled steroids: Secondary | ICD-10-CM

## 2018-11-15 DIAGNOSIS — R34 Anuria and oliguria: Secondary | ICD-10-CM | POA: Diagnosis not present

## 2018-11-15 DIAGNOSIS — I255 Ischemic cardiomyopathy: Secondary | ICD-10-CM

## 2018-11-15 DIAGNOSIS — R0902 Hypoxemia: Secondary | ICD-10-CM | POA: Diagnosis not present

## 2018-11-15 DIAGNOSIS — Z4659 Encounter for fitting and adjustment of other gastrointestinal appliance and device: Secondary | ICD-10-CM

## 2018-11-15 DIAGNOSIS — Z452 Encounter for adjustment and management of vascular access device: Secondary | ICD-10-CM

## 2018-11-15 DIAGNOSIS — L8990 Pressure ulcer of unspecified site, unspecified stage: Secondary | ICD-10-CM

## 2018-11-15 DIAGNOSIS — Z833 Family history of diabetes mellitus: Secondary | ICD-10-CM

## 2018-11-15 DIAGNOSIS — Z79899 Other long term (current) drug therapy: Secondary | ICD-10-CM

## 2018-11-15 DIAGNOSIS — R6521 Severe sepsis with septic shock: Secondary | ICD-10-CM | POA: Diagnosis not present

## 2018-11-15 DIAGNOSIS — M19012 Primary osteoarthritis, left shoulder: Secondary | ICD-10-CM | POA: Diagnosis present

## 2018-11-15 DIAGNOSIS — K559 Vascular disorder of intestine, unspecified: Secondary | ICD-10-CM | POA: Diagnosis not present

## 2018-11-15 DIAGNOSIS — J811 Chronic pulmonary edema: Secondary | ICD-10-CM | POA: Diagnosis not present

## 2018-11-15 DIAGNOSIS — J849 Interstitial pulmonary disease, unspecified: Secondary | ICD-10-CM | POA: Diagnosis present

## 2018-11-15 DIAGNOSIS — R0609 Other forms of dyspnea: Secondary | ICD-10-CM | POA: Diagnosis present

## 2018-11-15 DIAGNOSIS — I4892 Unspecified atrial flutter: Secondary | ICD-10-CM | POA: Diagnosis not present

## 2018-11-15 DIAGNOSIS — J9601 Acute respiratory failure with hypoxia: Secondary | ICD-10-CM | POA: Diagnosis not present

## 2018-11-15 DIAGNOSIS — R109 Unspecified abdominal pain: Secondary | ICD-10-CM

## 2018-11-15 DIAGNOSIS — A419 Sepsis, unspecified organism: Secondary | ICD-10-CM | POA: Diagnosis not present

## 2018-11-15 DIAGNOSIS — I10 Essential (primary) hypertension: Secondary | ICD-10-CM | POA: Diagnosis present

## 2018-11-15 DIAGNOSIS — N179 Acute kidney failure, unspecified: Secondary | ICD-10-CM | POA: Diagnosis present

## 2018-11-15 DIAGNOSIS — J441 Chronic obstructive pulmonary disease with (acute) exacerbation: Secondary | ICD-10-CM | POA: Diagnosis not present

## 2018-11-15 DIAGNOSIS — Z880 Allergy status to penicillin: Secondary | ICD-10-CM

## 2018-11-15 DIAGNOSIS — L7632 Postprocedural hematoma of skin and subcutaneous tissue following other procedure: Secondary | ICD-10-CM | POA: Diagnosis not present

## 2018-11-15 DIAGNOSIS — J449 Chronic obstructive pulmonary disease, unspecified: Secondary | ICD-10-CM | POA: Diagnosis not present

## 2018-11-15 DIAGNOSIS — R0600 Dyspnea, unspecified: Secondary | ICD-10-CM

## 2018-11-15 DIAGNOSIS — J9690 Respiratory failure, unspecified, unspecified whether with hypoxia or hypercapnia: Secondary | ICD-10-CM

## 2018-11-15 DIAGNOSIS — R000 Tachycardia, unspecified: Secondary | ICD-10-CM | POA: Diagnosis not present

## 2018-11-15 DIAGNOSIS — Z9911 Dependence on respirator [ventilator] status: Secondary | ICD-10-CM

## 2018-11-15 DIAGNOSIS — M19011 Primary osteoarthritis, right shoulder: Secondary | ICD-10-CM | POA: Diagnosis present

## 2018-11-15 DIAGNOSIS — I2510 Atherosclerotic heart disease of native coronary artery without angina pectoris: Secondary | ICD-10-CM | POA: Diagnosis present

## 2018-11-15 DIAGNOSIS — R570 Cardiogenic shock: Secondary | ICD-10-CM

## 2018-11-15 DIAGNOSIS — I2129 ST elevation (STEMI) myocardial infarction involving other sites: Principal | ICD-10-CM | POA: Diagnosis present

## 2018-11-15 DIAGNOSIS — K659 Peritonitis, unspecified: Secondary | ICD-10-CM | POA: Diagnosis not present

## 2018-11-15 DIAGNOSIS — Z7952 Long term (current) use of systemic steroids: Secondary | ICD-10-CM

## 2018-11-15 DIAGNOSIS — Z209 Contact with and (suspected) exposure to unspecified communicable disease: Secondary | ICD-10-CM | POA: Diagnosis not present

## 2018-11-15 DIAGNOSIS — I214 Non-ST elevation (NSTEMI) myocardial infarction: Secondary | ICD-10-CM | POA: Diagnosis present

## 2018-11-15 DIAGNOSIS — I480 Paroxysmal atrial fibrillation: Secondary | ICD-10-CM | POA: Diagnosis not present

## 2018-11-15 DIAGNOSIS — K219 Gastro-esophageal reflux disease without esophagitis: Secondary | ICD-10-CM | POA: Diagnosis present

## 2018-11-15 DIAGNOSIS — Y840 Cardiac catheterization as the cause of abnormal reaction of the patient, or of later complication, without mention of misadventure at the time of the procedure: Secondary | ICD-10-CM | POA: Diagnosis not present

## 2018-11-15 DIAGNOSIS — I5021 Acute systolic (congestive) heart failure: Secondary | ICD-10-CM | POA: Diagnosis not present

## 2018-11-15 DIAGNOSIS — R0602 Shortness of breath: Secondary | ICD-10-CM

## 2018-11-15 DIAGNOSIS — I130 Hypertensive heart and chronic kidney disease with heart failure and stage 1 through stage 4 chronic kidney disease, or unspecified chronic kidney disease: Secondary | ICD-10-CM | POA: Diagnosis present

## 2018-11-15 DIAGNOSIS — A481 Legionnaires' disease: Secondary | ICD-10-CM | POA: Diagnosis present

## 2018-11-15 DIAGNOSIS — I472 Ventricular tachycardia: Secondary | ICD-10-CM | POA: Diagnosis present

## 2018-11-15 LAB — CBC WITH DIFFERENTIAL/PLATELET
Abs Immature Granulocytes: 0.05 10*3/uL (ref 0.00–0.07)
Basophils Absolute: 0 10*3/uL (ref 0.0–0.1)
Basophils Relative: 0 %
Eosinophils Absolute: 0 10*3/uL (ref 0.0–0.5)
Eosinophils Relative: 0 %
HCT: 39 % (ref 39.0–52.0)
Hemoglobin: 12.4 g/dL — ABNORMAL LOW (ref 13.0–17.0)
Immature Granulocytes: 1 %
Lymphocytes Relative: 8 %
Lymphs Abs: 0.8 10*3/uL (ref 0.7–4.0)
MCH: 29.9 pg (ref 26.0–34.0)
MCHC: 31.8 g/dL (ref 30.0–36.0)
MCV: 94 fL (ref 80.0–100.0)
Monocytes Absolute: 0.3 10*3/uL (ref 0.1–1.0)
Monocytes Relative: 3 %
Neutro Abs: 8.9 10*3/uL — ABNORMAL HIGH (ref 1.7–7.7)
Neutrophils Relative %: 88 %
Platelets: 232 10*3/uL (ref 150–400)
RBC: 4.15 MIL/uL — ABNORMAL LOW (ref 4.22–5.81)
RDW: 15.1 % (ref 11.5–15.5)
WBC: 10 10*3/uL (ref 4.0–10.5)
nRBC: 0 % (ref 0.0–0.2)

## 2018-11-15 LAB — TROPONIN I: Troponin I: 0.34 ng/mL (ref <0.03)

## 2018-11-15 LAB — BASIC METABOLIC PANEL
Anion gap: 12 (ref 5–15)
BUN: 37 mg/dL — ABNORMAL HIGH (ref 8–23)
CO2: 20 mmol/L — ABNORMAL LOW (ref 22–32)
Calcium: 8.9 mg/dL (ref 8.9–10.3)
Chloride: 104 mmol/L (ref 98–111)
Creatinine, Ser: 1.58 mg/dL — ABNORMAL HIGH (ref 0.61–1.24)
GFR calc Af Amer: 50 mL/min — ABNORMAL LOW (ref >60)
GFR calc non Af Amer: 43 mL/min — ABNORMAL LOW (ref >60)
Glucose, Bld: 190 mg/dL — ABNORMAL HIGH (ref 70–99)
Potassium: 4.1 mmol/L (ref 3.5–5.1)
Sodium: 136 mmol/L (ref 135–145)

## 2018-11-15 LAB — PROTIME-INR
INR: 1.1 (ref 0.8–1.2)
Prothrombin Time: 14 seconds (ref 11.4–15.2)

## 2018-11-15 LAB — SARS CORONAVIRUS 2 BY RT PCR (HOSPITAL ORDER, PERFORMED IN ~~LOC~~ HOSPITAL LAB): SARS Coronavirus 2: NEGATIVE

## 2018-11-15 LAB — APTT: aPTT: 28 seconds (ref 24–36)

## 2018-11-15 MED ORDER — NITROGLYCERIN 0.4 MG SL SUBL
0.4000 mg | SUBLINGUAL_TABLET | Freq: Once | SUBLINGUAL | Status: AC
  Administered 2018-11-15: 0.4 mg via SUBLINGUAL
  Filled 2018-11-15: qty 1

## 2018-11-15 MED ORDER — HEPARIN (PORCINE) 25000 UT/250ML-% IV SOLN
1100.0000 [IU]/h | INTRAVENOUS | Status: DC
  Filled 2018-11-15: qty 250

## 2018-11-15 MED ORDER — FUROSEMIDE 10 MG/ML IJ SOLN
40.0000 mg | Freq: Once | INTRAMUSCULAR | Status: AC
  Administered 2018-11-15: 40 mg via INTRAVENOUS
  Filled 2018-11-15: qty 4

## 2018-11-15 MED ORDER — HEPARIN (PORCINE) 25000 UT/250ML-% IV SOLN
1100.0000 [IU]/h | INTRAVENOUS | Status: DC
  Administered 2018-11-15 – 2018-11-16 (×2): 1100 [IU]/h via INTRAVENOUS

## 2018-11-15 MED ORDER — HEPARIN BOLUS VIA INFUSION: 4000.0000 [IU] | Freq: Once | INTRAVENOUS | Status: DC

## 2018-11-15 MED ORDER — HEPARIN SODIUM (PORCINE) 5000 UNIT/ML IJ SOLN
4000.0000 [IU] | Freq: Once | INTRAMUSCULAR | Status: AC
  Administered 2018-11-15: 4000 [IU] via INTRAVENOUS
  Filled 2018-11-15: qty 1

## 2018-11-15 MED ORDER — ASPIRIN 325 MG PO TABS
325.0000 mg | ORAL_TABLET | Freq: Once | ORAL | Status: AC
  Administered 2018-11-15: 325 mg via ORAL
  Filled 2018-11-15: qty 1

## 2018-11-15 NOTE — ED Provider Notes (Signed)
[facility] EMERGENCY DEPARTMENT Provider Note   CSN: [id number] Arrival date & time: December 04, 2018  2118    History   Chief Complaint Chief Complaint  Patient presents with  . Shortness of Breath    chest pain    HPI Eric Spencer is a 73 y.o. male.     Level 5 caveat for urgency of condition.  Chest pain and dyspnea since earlier today.  Patient has a known history of COPD, but no obvious cardiac disease.  He has been taking multiple nebulizer and MDI treatments.  EMS administered Solu-Medrol 125 mg IV and nebulizer treatments in route.  Pulse ox 100% on 2 L of nasal cannula.     Past Medical History:  Diagnosis Date  . Arthritis   . Asthma   . COPD (chronic obstructive pulmonary disease) (HCC)   . Hypertension     Patient Active Problem List   Diagnosis Date Noted  . DOE (dyspnea on exertion) 05/28/2018  . Seasonal and perennial allergic rhinitis 02/20/2018  . Gastroesophageal reflux disease 02/20/2018  . Cough 11/10/2017  . COPD with acute exacerbation (HCC) 10/25/2017  . 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 Essential hypertension 01/13/2016  . Peripheral neuropathy 01/13/2016    Past Surgical History:  Procedure Laterality Date  . ADENOIDECTOMY    . HERNIA REPAIR    . TONSILLECTOMY          Home Medications    Prior to Admission medications   Medication Sig Start Date End Date Taking? Authorizing Provider  albuterol (PROVENTIL) (2.5 MG/3ML) 0.083% nebulizer solution Take 3 mLs (2.5 mg total) by nebulization every 6 (six) hours as needed for wheezing or shortness of breath. 11/05/18   Nyoka Cowden, MD  amLODipine (NORVASC) 10 MG tablet TAKE 1 TABLET BY MOUTH ONCE DAILY. 08/29/18   Donita Brooks, MD  arformoterol (BROVANA) 15 MCG/2ML NEBU Take 2 mLs (15 mcg total) by nebulization 2 (two) times daily. 03/18/18   Nyoka Cowden, MD  bisoprolol (ZEBETA) 5 MG tablet Take 1 tablet (5 mg total) by mouth daily. 11/09/17   Nyoka Cowden, MD   budesonide (PULMICORT) 0.25 MG/2ML nebulizer solution One twice daily with brovana 08/26/18   Nyoka Cowden, MD  cetirizine (ZYRTEC) 10 MG tablet Take 1 tablet (10 mg total) by mouth daily. 09/02/18   Alfonse Spruce, MD  clotrimazole (MYCELEX) 10 MG troche Take 1 tablet (10 mg total) by mouth 4 (four) times daily as needed. 11/05/18   Nyoka Cowden, MD  dextromethorphan-guaiFENesin (MUCINEX DM) 30-600 MG 12hr tablet Take 1 tablet by mouth 2 (two) times daily.    [provider]  famotidine (PEPCID) 20 MG tablet TAKE ONE TABLET BY MOUTH AT BEDTIME. 11/11/18   Nyoka Cowden, MD  ibuprofen (ADVIL,MOTRIN) 200 MG tablet Take 200 mg by mouth every 6 (six) hours as needed.    [provider]  montelukast (SINGULAIR) 10 MG tablet TAKE 1 TABLET BY MOUTH ONCE DAILY. 03/12/18   Nyoka Cowden, MD  pantoprazole (PROTONIX) 40 MG tablet TAKE ONE TABLET BY MOUTH DAILY 30 TO 60 MINUTES BEFORE FIRST MEAL OF THE DAY. 10/18/18   Nyoka Cowden, MD  predniSONE (DELTASONE) 10 MG tablet Two with bfast daily until better,  Then one daily x 5 days and stop 08/26/18   Nyoka Cowden, MD  Respiratory Therapy Supplies (FLUTTER) DEVI 1 Device by Does not apply route as needed. 10/25/17   Nyoka Cowden, MD  Revefenacin (YUPELRI) 175 MCG/3ML SOLN Inhale 3 mLs into the lungs daily. 03/18/18   Nyoka Cowden, MD  Roflumilast (DALIRESP) 250 MCG TABS Take 250 mcg by mouth daily. 07/24/18   Parrett, Virgel Bouquet, NP  triamcinolone (NASACORT) 55 MCG/ACT AERO nasal inhaler Place 2 sprays into the nose daily.    [provider]  triamterene-hydrochlorothiazide Joseph Pierini) 37.5-25 MG tablet One daily as needed for leg swelling 04/15/18   Nyoka Cowden, MD  VENTOLIN HFA 108 (90 Base) MCG/ACT inhaler INHALE 1 TO 2 PUFFS INTO LUNGS EVERY 4 HOURS AS NEEDED. 10/18/18   Nyoka Cowden, MD    Family History Family History  Problem Relation Age of Onset  . Hypertension Mother   . Arthritis Father   . Stroke  Father   . Diabetes Maternal Grandmother   . Allergic rhinitis Neg Hx   . Asthma Neg Hx   . Eczema Neg Hx   . Angioedema Neg Hx     Social History Social History   Tobacco Use  . Smoking status: Former Smoker    Packs/day: 1.50    Years: 48.00    Pack years: 72.00    Types: Cigarettes    Last attempt to quit: 10/28/2015    Years since quitting: 3.0  . Smokeless tobacco: Never Used  Substance Use Topics  . Alcohol use: No  . Drug use: No     Allergies   Penicillins   Review of Systems Review of Systems  Unable to perform ROS: Acuity of condition     Physical Exam Updated Vital Signs BP 103/71   Pulse (!) 115   Temp (!) 97.5 F (36.4 C) (Oral)   Resp (!) 24   Ht 6\' 2"  (1.88 m)   Wt 90.7 kg   SpO2 100%   BMI 25.68 kg/m   Physical Exam Vitals signs and nursing note reviewed.  Constitutional:      Comments: Tachycardic, oxygenating well on nasal cannula  HENT:     Head: Normocephalic and atraumatic.  Eyes:     Conjunctiva/sclera: Conjunctivae normal.  Neck:     Musculoskeletal: Neck supple.  Cardiovascular:     Rate and Rhythm: Regular rhythm. Tachycardia present.  Pulmonary:     Effort: Pulmonary effort is normal.     Breath sounds: Wheezing present.  Abdominal:     General: Bowel sounds are normal.     Palpations: Abdomen is soft.  Musculoskeletal: Normal range of motion.  Skin:    General: Skin is warm and dry.  Neurological:     Mental Status: He is alert and oriented to person, place, and time.  Psychiatric:        Behavior: Behavior normal.      ED Treatments / Results  Labs (all labs ordered are listed, but only abnormal results are displayed) Labs Reviewed  BASIC METABOLIC PANEL - Abnormal; Notable for the following components:      Result Value   CO2 20 (*)    Glucose, Bld 190 (*)    BUN 37 (*)    Creatinine, Ser 1.58 (*)    GFR calc non Af Amer 43 (*)    GFR calc Af Amer 50 (*)    All other components within normal limits   CBC WITH DIFFERENTIAL/PLATELET - Abnormal; Notable for the following components:   RBC 4.15 (*)    Hemoglobin 12.4 (*)    Neutro Abs 8.9 (*)    All other components within normal limits  TROPONIN  I - Abnormal; Notable for the following components:   Troponin I 0.34 (*)    All other components within normal limits  SARS CORONAVIRUS 2 (HOSPITAL ORDER[facility])    EKG EKG Interpretation  Date/Time:  Friday Nov 15 2018 21:28:14 EDT Ventricular Rate:  118 PR Interval:    QRS Duration: 98 QT Interval:  341 QTC Calculation: 478 R Axis:   -27 Text Interpretation:  Sinus tachycardia Borderline left axis deviation Anteroseptal infarct, age indeterminate Lateral leads are also involved Confirmed by Donnetta Hutchingook, Tyeler Goedken ([id number]) on 10/26/2018 10:29:43 PM   Radiology Dg Chest Port 1 View  Result Date: 11/08/2018 CLINICAL DATA:  Initial evaluation for acute chest pain with shortness of breath. History of COPD. EXAM: PORTABLE CHEST 1 VIEW COMPARISON:  Prior radiograph from 04/15/2018 FINDINGS: Cardiac and mediastinal silhouettes stable in size and contour, and remain within normal limits. Lungs are hyperinflated with changes compatible with COPD. Superimposed diffuse increased prominence of the interstitial markings with perihilar vascular congestion, most suggestive of superimposed pulmonary interstitial edema. No consolidative opacity. Possible trace right pleural effusion. No pneumothorax. No acute osseous finding. IMPRESSION: 1. COPD with superimposed mild diffuse pulmonary interstitial edema. 2. Possible trace right pleural effusion. Electronically Signed   By: Rise MuBenjamin  McClintock M.D.   On: 10/30/2018 22:18    Procedures Procedures (including critical care time)  Medications Ordered in ED Medications  furosemide (LASIX) injection 40 mg (40 mg Intravenous Given 10/29/2018 2238)  aspirin tablet 325 mg (325 mg Oral Given 11/07/2018 2238)     Initial Impression / Assessment  and Plan / ED Course  I have reviewed the triage vital signs and the nursing notes.  Pertinent labs & imaging results that were available during my care of the patient were reviewed by me and considered in my medical decision making (see chart for details).       Patient initially presented as a COPD exacerbation.  However his EKG was abnormal, specifically ST depression inferiorly, T wave inversion laterally, ST elevation in a AVL and troponin was elevated.  Discussed with cardiologist on-call.  We decided not to call a code STEMI.  Aspirin and heparin initiated.  [facility].  Covid negative.    CRITICAL CARE Performed by: Donnetta HutchingBrian Almyra Birman Total critical care time: 40 minutes Critical care time was exclusive of separately billable procedures and treating other patients. Critical care was necessary to treat or prevent imminent or life-threatening deterioration. Critical care was time spent personally by me on the following activities: development of treatment plan with patient and/or surrogate as well as nursing, discussions with consultants, evaluation of patient's response to treatment, examination of patient, obtaining history from patient or surrogate, ordering and performing treatments and interventions, ordering and review of laboratory studies, ordering and review of radiographic studies, pulse oximetry and re-evaluation of patient's condition.  Final Clinical Impressions(s) / ED Diagnoses   Final diagnoses:  COPD exacerbation (HCC)  Elevated troponin  Abnormal EKG    ED Discharge Orders    None       Donnetta Hutchingook, Wahid Holley, MD 11/24/2018 2306

## 2018-11-15 NOTE — ED Notes (Signed)
ED Provider at bedside. 

## 2018-11-15 NOTE — ED Notes (Signed)
Pt's wife [phone]

## 2018-11-15 NOTE — Progress Notes (Signed)
ANTICOAGULATION CONSULT NOTE - Preliminary  Pharmacy Consult for heparin Indication: chest pain/ACS  Allergies  Allergen Reactions  . Penicillins     Childhood allergy- unaware of reaction    Patient Measurements: Height: 6\' 2"  (188 cm) Weight: 200 lb (90.7 kg) IBW/kg (Calculated) : 82.2 HEPARIN DW (KG): 90.7   Vital Signs: Temp: 97.5 F (36.4 C) (05/22 2123) Temp Source: Oral (05/22 2123) BP: 103/71 (05/22 2300) Pulse Rate: 118 (05/22 2300)  Labs: Recent Labs    11/11/2018 2146  HGB 12.4*  HCT 39.0  PLT 232  CREATININE 1.58*  TROPONINI 0.34*   Estimated Creatinine Clearance: 49.9 mL/min (A) (by C-G formula based on SCr of 1.58 mg/dL (H)).  Medical History: Past Medical History:  Diagnosis Date  . Arthritis   . Asthma   . COPD (chronic obstructive pulmonary disease) (HCC)   . Hypertension     Medications:  Scheduled:   Infusions:  . heparin     PRN:   Assessment: 73 yo male presented to ED at [facility] with SOB and chest pain.  THought initially to be COPD exacerbation.  EKG was abnormal with elevated troponin.  Started heparin and plan to transfer to [facility].   Goal of Therapy:  Heparin level 0.3-0.7 units/ml   Plan:  Give 4000 units bolus x 1 Start heparin infusion at 1100 units/hr Check anti-Xa level in 6 hours and daily while on heparin Continue to monitor H&H and platelets Preliminary review of pertinent patient information completed.  Jeani Hawking clinical pharmacist will complete review during morning rounds to assess the patient and finalize treatment regimen.  Loyola Mast, RPH Nov 23, 2018,11:15 PM

## 2018-11-15 NOTE — ED Triage Notes (Signed)
Patient has a hx of COPD. Patient has been having breathing problems and chest pain that started today. Patient has audible wheezing. EMS gave 125 solu-medrol, 2 albuterol nebs and 1 Atrovent. Patient is taking prednisone at home. Patient 100 percent O2 on 2 liters nasal cannula.

## 2018-11-15 NOTE — ED Notes (Signed)
Date and time results received: 11/27/18 2221 (use smartphrase ".now" to insert current time)  Test: troponin Critical Value: 0.34  Name of Provider Notified: Dr Glendell Docker  Orders Received? Or Actions Taken?:

## 2018-11-16 ENCOUNTER — Inpatient Hospital Stay (HOSPITAL_COMMUNITY): Payer: Medicare Other

## 2018-11-16 DIAGNOSIS — J449 Chronic obstructive pulmonary disease, unspecified: Secondary | ICD-10-CM | POA: Diagnosis not present

## 2018-11-16 DIAGNOSIS — A419 Sepsis, unspecified organism: Secondary | ICD-10-CM | POA: Diagnosis not present

## 2018-11-16 DIAGNOSIS — E872 Acidosis: Secondary | ICD-10-CM | POA: Diagnosis not present

## 2018-11-16 DIAGNOSIS — J432 Centrilobular emphysema: Secondary | ICD-10-CM | POA: Diagnosis not present

## 2018-11-16 DIAGNOSIS — J90 Pleural effusion, not elsewhere classified: Secondary | ICD-10-CM | POA: Diagnosis not present

## 2018-11-16 DIAGNOSIS — R570 Cardiogenic shock: Secondary | ICD-10-CM | POA: Diagnosis not present

## 2018-11-16 DIAGNOSIS — I214 Non-ST elevation (NSTEMI) myocardial infarction: Secondary | ICD-10-CM | POA: Diagnosis present

## 2018-11-16 DIAGNOSIS — N179 Acute kidney failure, unspecified: Secondary | ICD-10-CM

## 2018-11-16 DIAGNOSIS — I472 Ventricular tachycardia: Secondary | ICD-10-CM | POA: Diagnosis present

## 2018-11-16 DIAGNOSIS — K559 Vascular disorder of intestine, unspecified: Secondary | ICD-10-CM | POA: Diagnosis not present

## 2018-11-16 DIAGNOSIS — R579 Shock, unspecified: Secondary | ICD-10-CM | POA: Diagnosis not present

## 2018-11-16 DIAGNOSIS — I10 Essential (primary) hypertension: Secondary | ICD-10-CM | POA: Diagnosis not present

## 2018-11-16 DIAGNOSIS — I340 Nonrheumatic mitral (valve) insufficiency: Secondary | ICD-10-CM | POA: Diagnosis not present

## 2018-11-16 DIAGNOSIS — K659 Peritonitis, unspecified: Secondary | ICD-10-CM | POA: Diagnosis not present

## 2018-11-16 DIAGNOSIS — R188 Other ascites: Secondary | ICD-10-CM | POA: Diagnosis not present

## 2018-11-16 DIAGNOSIS — J439 Emphysema, unspecified: Secondary | ICD-10-CM | POA: Diagnosis not present

## 2018-11-16 DIAGNOSIS — Z452 Encounter for adjustment and management of vascular access device: Secondary | ICD-10-CM | POA: Diagnosis not present

## 2018-11-16 DIAGNOSIS — I4892 Unspecified atrial flutter: Secondary | ICD-10-CM | POA: Diagnosis not present

## 2018-11-16 DIAGNOSIS — D696 Thrombocytopenia, unspecified: Secondary | ICD-10-CM | POA: Diagnosis not present

## 2018-11-16 DIAGNOSIS — J984 Other disorders of lung: Secondary | ICD-10-CM | POA: Diagnosis not present

## 2018-11-16 DIAGNOSIS — R0609 Other forms of dyspnea: Secondary | ICD-10-CM

## 2018-11-16 DIAGNOSIS — R6521 Severe sepsis with septic shock: Secondary | ICD-10-CM | POA: Diagnosis not present

## 2018-11-16 DIAGNOSIS — J849 Interstitial pulmonary disease, unspecified: Secondary | ICD-10-CM | POA: Diagnosis present

## 2018-11-16 DIAGNOSIS — Z9911 Dependence on respirator [ventilator] status: Secondary | ICD-10-CM | POA: Diagnosis not present

## 2018-11-16 DIAGNOSIS — I255 Ischemic cardiomyopathy: Secondary | ICD-10-CM | POA: Diagnosis present

## 2018-11-16 DIAGNOSIS — J811 Chronic pulmonary edema: Secondary | ICD-10-CM | POA: Diagnosis not present

## 2018-11-16 DIAGNOSIS — Z20828 Contact with and (suspected) exposure to other viral communicable diseases: Secondary | ICD-10-CM | POA: Diagnosis not present

## 2018-11-16 DIAGNOSIS — J181 Lobar pneumonia, unspecified organism: Secondary | ICD-10-CM | POA: Diagnosis not present

## 2018-11-16 DIAGNOSIS — Z4682 Encounter for fitting and adjustment of non-vascular catheter: Secondary | ICD-10-CM | POA: Diagnosis not present

## 2018-11-16 DIAGNOSIS — R918 Other nonspecific abnormal finding of lung field: Secondary | ICD-10-CM | POA: Diagnosis not present

## 2018-11-16 DIAGNOSIS — R7989 Other specified abnormal findings of blood chemistry: Secondary | ICD-10-CM

## 2018-11-16 DIAGNOSIS — I480 Paroxysmal atrial fibrillation: Secondary | ICD-10-CM | POA: Diagnosis not present

## 2018-11-16 DIAGNOSIS — Z515 Encounter for palliative care: Secondary | ICD-10-CM | POA: Diagnosis not present

## 2018-11-16 DIAGNOSIS — D62 Acute posthemorrhagic anemia: Secondary | ICD-10-CM | POA: Diagnosis not present

## 2018-11-16 DIAGNOSIS — J441 Chronic obstructive pulmonary disease with (acute) exacerbation: Secondary | ICD-10-CM | POA: Diagnosis not present

## 2018-11-16 DIAGNOSIS — I509 Heart failure, unspecified: Secondary | ICD-10-CM | POA: Diagnosis not present

## 2018-11-16 DIAGNOSIS — R0989 Other specified symptoms and signs involving the circulatory and respiratory systems: Secondary | ICD-10-CM | POA: Diagnosis not present

## 2018-11-16 DIAGNOSIS — R34 Anuria and oliguria: Secondary | ICD-10-CM | POA: Diagnosis not present

## 2018-11-16 DIAGNOSIS — N183 Chronic kidney disease, stage 3 (moderate): Secondary | ICD-10-CM | POA: Diagnosis present

## 2018-11-16 DIAGNOSIS — D649 Anemia, unspecified: Secondary | ICD-10-CM | POA: Diagnosis not present

## 2018-11-16 DIAGNOSIS — I2129 ST elevation (STEMI) myocardial infarction involving other sites: Secondary | ICD-10-CM | POA: Diagnosis not present

## 2018-11-16 DIAGNOSIS — L7632 Postprocedural hematoma of skin and subcutaneous tissue following other procedure: Secondary | ICD-10-CM | POA: Diagnosis not present

## 2018-11-16 DIAGNOSIS — I130 Hypertensive heart and chronic kidney disease with heart failure and stage 1 through stage 4 chronic kidney disease, or unspecified chronic kidney disease: Secondary | ICD-10-CM | POA: Diagnosis present

## 2018-11-16 DIAGNOSIS — I2510 Atherosclerotic heart disease of native coronary artery without angina pectoris: Secondary | ICD-10-CM | POA: Diagnosis present

## 2018-11-16 DIAGNOSIS — A481 Legionnaires' disease: Secondary | ICD-10-CM | POA: Diagnosis not present

## 2018-11-16 DIAGNOSIS — R0603 Acute respiratory distress: Secondary | ICD-10-CM | POA: Diagnosis not present

## 2018-11-16 DIAGNOSIS — N170 Acute kidney failure with tubular necrosis: Secondary | ICD-10-CM | POA: Diagnosis not present

## 2018-11-16 DIAGNOSIS — I5021 Acute systolic (congestive) heart failure: Secondary | ICD-10-CM | POA: Diagnosis not present

## 2018-11-16 DIAGNOSIS — J9601 Acute respiratory failure with hypoxia: Secondary | ICD-10-CM

## 2018-11-16 DIAGNOSIS — Y840 Cardiac catheterization as the cause of abnormal reaction of the patient, or of later complication, without mention of misadventure at the time of the procedure: Secondary | ICD-10-CM | POA: Diagnosis not present

## 2018-11-16 DIAGNOSIS — K55019 Acute (reversible) ischemia of small intestine, extent unspecified: Secondary | ICD-10-CM | POA: Diagnosis not present

## 2018-11-16 DIAGNOSIS — Z95811 Presence of heart assist device: Secondary | ICD-10-CM | POA: Diagnosis not present

## 2018-11-16 DIAGNOSIS — K6389 Other specified diseases of intestine: Secondary | ICD-10-CM | POA: Diagnosis not present

## 2018-11-16 DIAGNOSIS — J9690 Respiratory failure, unspecified, unspecified whether with hypoxia or hypercapnia: Secondary | ICD-10-CM | POA: Diagnosis not present

## 2018-11-16 LAB — LIPID PANEL
Cholesterol: 261 mg/dL — ABNORMAL HIGH (ref 0–200)
HDL: 51 mg/dL (ref >40)
LDL Cholesterol: 190 mg/dL — ABNORMAL HIGH (ref 0–99)
Total CHOL/HDL Ratio: 5.1 RATIO
Triglycerides: 98 mg/dL (ref <150)
VLDL: 20 mg/dL (ref 0–40)

## 2018-11-16 LAB — COMPREHENSIVE METABOLIC PANEL
ALT: 19 U/L (ref 0–44)
AST: 27 U/L (ref 15–41)
Albumin: 3.6 g/dL (ref 3.5–5.0)
Alkaline Phosphatase: 58 U/L (ref 38–126)
Anion gap: 19 — ABNORMAL HIGH (ref 5–15)
BUN: 37 mg/dL — ABNORMAL HIGH (ref 8–23)
CO2: 15 mmol/L — ABNORMAL LOW (ref 22–32)
Calcium: 9.3 mg/dL (ref 8.9–10.3)
Chloride: 104 mmol/L (ref 98–111)
Creatinine, Ser: 2.38 mg/dL — ABNORMAL HIGH (ref 0.61–1.24)
GFR calc Af Amer: 31 mL/min — ABNORMAL LOW (ref >60)
GFR calc non Af Amer: 26 mL/min — ABNORMAL LOW (ref >60)
Glucose, Bld: 238 mg/dL — ABNORMAL HIGH (ref 70–99)
Potassium: 4.5 mmol/L (ref 3.5–5.1)
Sodium: 138 mmol/L (ref 135–145)
Total Bilirubin: 0.6 mg/dL (ref 0.3–1.2)
Total Protein: 7.1 g/dL (ref 6.5–8.1)

## 2018-11-16 LAB — BLOOD GAS, ARTERIAL
Acid-base deficit: 8.6 mmol/L — ABNORMAL HIGH (ref 0.0–2.0)
Bicarbonate: 16.2 mmol/L — ABNORMAL LOW (ref 20.0–28.0)
Drawn by: 54503
O2 Content: 4 L/min
O2 Saturation: 96.3 %
Patient temperature: 98.6
pCO2 arterial: 32.1 mmHg (ref 32.0–48.0)
pH, Arterial: 7.325 — ABNORMAL LOW (ref 7.350–7.450)
pO2, Arterial: 94.5 mmHg (ref 83.0–108.0)

## 2018-11-16 LAB — HEPARIN LEVEL (UNFRACTIONATED)
Heparin Unfractionated: 0.53 IU/mL (ref 0.30–0.70)
Heparin Unfractionated: 0.61 IU/mL (ref 0.30–0.70)

## 2018-11-16 LAB — TROPONIN I
Troponin I: 0.98 ng/mL (ref <0.03)
Troponin I: 3.29 ng/mL (ref <0.03)
Troponin I: 6.46 ng/mL (ref <0.03)

## 2018-11-16 LAB — T4, FREE: Free T4: 1.08 ng/dL (ref 0.82–1.77)

## 2018-11-16 LAB — MAGNESIUM: Magnesium: 2.3 mg/dL (ref 1.7–2.4)

## 2018-11-16 LAB — LACTIC ACID, PLASMA
Lactic Acid, Venous: 8.2 mmol/L (ref 0.5–1.9)
Lactic Acid, Venous: 9 mmol/L (ref 0.5–1.9)

## 2018-11-16 LAB — TSH: TSH: 2.416 u[IU]/mL (ref 0.350–4.500)

## 2018-11-16 LAB — BRAIN NATRIURETIC PEPTIDE: B Natriuretic Peptide: 1543 pg/mL — ABNORMAL HIGH (ref 0.0–100.0)

## 2018-11-16 LAB — HEMOGLOBIN A1C
Hgb A1c MFr Bld: 5.6 % (ref 4.8–5.6)
Mean Plasma Glucose: 114.02 mg/dL

## 2018-11-16 MED ORDER — METHYLPREDNISOLONE SODIUM SUCC 125 MG IJ SOLR
60.0000 mg | Freq: Four times a day (QID) | INTRAMUSCULAR | Status: DC
  Administered 2018-11-16: 04:00:00 60 mg via INTRAVENOUS
  Filled 2018-11-16: qty 2

## 2018-11-16 MED ORDER — IPRATROPIUM-ALBUTEROL 0.5-2.5 (3) MG/3ML IN SOLN
3.0000 mL | Freq: Four times a day (QID) | RESPIRATORY_TRACT | Status: DC
  Administered 2018-11-16: 3 mL via RESPIRATORY_TRACT
  Filled 2018-11-16: qty 3

## 2018-11-16 MED ORDER — BUDESONIDE 0.5 MG/2ML IN SUSP
0.5000 mg | Freq: Two times a day (BID) | RESPIRATORY_TRACT | Status: DC
  Administered 2018-11-16 – 2018-11-23 (×14): 0.5 mg via RESPIRATORY_TRACT
  Filled 2018-11-16 (×15): qty 2

## 2018-11-16 MED ORDER — METHYLPREDNISOLONE SODIUM SUCC 40 MG IJ SOLR
40.0000 mg | Freq: Two times a day (BID) | INTRAMUSCULAR | Status: DC
  Administered 2018-11-16 – 2018-11-23 (×14): 40 mg via INTRAVENOUS
  Filled 2018-11-16 (×14): qty 1

## 2018-11-16 MED ORDER — SODIUM CHLORIDE 0.9% FLUSH
3.0000 mL | Freq: Two times a day (BID) | INTRAVENOUS | Status: DC
  Administered 2018-11-16 – 2018-11-23 (×7): 3 mL via INTRAVENOUS

## 2018-11-16 MED ORDER — DOXYCYCLINE HYCLATE 100 MG PO TABS
100.0000 mg | ORAL_TABLET | Freq: Two times a day (BID) | ORAL | Status: DC
  Administered 2018-11-16: 04:00:00 100 mg via ORAL
  Filled 2018-11-16: qty 1

## 2018-11-16 MED ORDER — ATORVASTATIN CALCIUM 80 MG PO TABS
80.0000 mg | ORAL_TABLET | Freq: Every day | ORAL | Status: DC
  Administered 2018-11-16 – 2018-11-21 (×6): 80 mg via ORAL
  Filled 2018-11-16 (×6): qty 1

## 2018-11-16 MED ORDER — FAMOTIDINE 20 MG PO TABS: 40.0000 mg | ORAL_TABLET | Freq: Every day | ORAL | Status: DC

## 2018-11-16 MED ORDER — FAMOTIDINE 20 MG PO TABS: 20.0000 mg | ORAL_TABLET | Freq: Two times a day (BID) | ORAL | Status: DC

## 2018-11-16 MED ORDER — ARFORMOTEROL TARTRATE 15 MCG/2ML IN NEBU
15.0000 ug | INHALATION_SOLUTION | Freq: Two times a day (BID) | RESPIRATORY_TRACT | Status: DC
  Administered 2018-11-16 – 2018-11-23 (×14): 15 ug via RESPIRATORY_TRACT
  Filled 2018-11-16 (×15): qty 2

## 2018-11-16 MED ORDER — ALBUTEROL SULFATE (2.5 MG/3ML) 0.083% IN NEBU: 2.5000 mg | INHALATION_SOLUTION | RESPIRATORY_TRACT | Status: DC | PRN

## 2018-11-16 MED ORDER — IPRATROPIUM-ALBUTEROL 0.5-2.5 (3) MG/3ML IN SOLN
3.0000 mL | RESPIRATORY_TRACT | Status: DC | PRN
  Administered 2018-11-16 – 2018-11-17 (×2): 3 mL via RESPIRATORY_TRACT
  Filled 2018-11-16 (×2): qty 3

## 2018-11-16 MED ORDER — IPRATROPIUM-ALBUTEROL 0.5-2.5 (3) MG/3ML IN SOLN
3.0000 mL | RESPIRATORY_TRACT | Status: DC
  Administered 2018-11-16: 3 mL via RESPIRATORY_TRACT
  Filled 2018-11-16: qty 3

## 2018-11-16 MED ORDER — PANTOPRAZOLE SODIUM 40 MG PO TBEC
40.0000 mg | DELAYED_RELEASE_TABLET | Freq: Two times a day (BID) | ORAL | Status: DC
  Administered 2018-11-16: 40 mg via ORAL
  Filled 2018-11-16: qty 1

## 2018-11-16 MED ORDER — PREDNISONE 20 MG PO TABS: 40.0000 mg | ORAL_TABLET | Freq: Every day | ORAL | Status: DC

## 2018-11-16 MED ORDER — ENSURE ENLIVE PO LIQD: 237.0000 mL | Freq: Two times a day (BID) | ORAL | Status: DC

## 2018-11-16 MED ORDER — SODIUM CHLORIDE 0.9 % IV SOLN
Freq: Once | INTRAVENOUS | Status: AC
  Administered 2018-11-16: 04:00:00 via INTRAVENOUS

## 2018-11-16 MED ORDER — ADULT MULTIVITAMIN W/MINERALS CH: 1.0000 | ORAL_TABLET | Freq: Every day | ORAL | Status: DC

## 2018-11-16 MED ORDER — ONDANSETRON HCL 4 MG/2ML IJ SOLN: 4.0000 mg | Freq: Four times a day (QID) | INTRAMUSCULAR | Status: DC | PRN

## 2018-11-16 MED ORDER — ASPIRIN EC 81 MG PO TBEC: 81.0000 mg | DELAYED_RELEASE_TABLET | Freq: Every day | ORAL | Status: DC

## 2018-11-16 MED ORDER — FAMOTIDINE IN NACL 20-0.9 MG/50ML-% IV SOLN
20.0000 mg | INTRAVENOUS | Status: DC
  Administered 2018-11-16 – 2018-11-22 (×7): 20 mg via INTRAVENOUS
  Filled 2018-11-16 (×7): qty 50

## 2018-11-16 MED ORDER — ACETAMINOPHEN 325 MG PO TABS: 650.0000 mg | ORAL_TABLET | ORAL | Status: DC | PRN

## 2018-11-16 MED ORDER — NITROGLYCERIN 0.4 MG SL SUBL: 0.4000 mg | SUBLINGUAL_TABLET | SUBLINGUAL | Status: DC | PRN

## 2018-11-16 MED ORDER — LORATADINE 10 MG PO TABS
10.0000 mg | ORAL_TABLET | Freq: Every day | ORAL | Status: DC
  Administered 2018-11-18 – 2018-11-22 (×5): 10 mg via ORAL
  Filled 2018-11-16 (×6): qty 1

## 2018-11-16 MED ORDER — FAMOTIDINE 20 MG PO TABS: 20.0000 mg | ORAL_TABLET | Freq: Every day | ORAL | Status: DC

## 2018-11-16 NOTE — Procedures (Signed)
Set up for bedside echo, but patient became extremely short of breath and heart rate increased over 120.  Will try echo some other time when patient is able to tolerate.

## 2018-11-16 NOTE — Progress Notes (Signed)
ANTICOAGULATION CONSULT NOTE - Follow Up Consult  Pharmacy Consult for Heparin Indication: chest pain/ACS/ NSTEMI  Allergies  Allergen Reactions  . Penicillins     Childhood allergy- unaware of reaction    Patient Measurements: Height: 6\' 2"  (188 cm) Weight: 183 lb 6.8 oz (83.2 kg) IBW/kg (Calculated) : 82.2 Heparin Dosing Weight: 83 kg  Vital Signs: Temp: 97.5 F (36.4 C) (05/23 0619) Temp Source: Axillary (05/23 0619) BP: 95/71 (05/23 0619) Pulse Rate: 139 (05/23 0809)  Labs: Recent Labs    12/06/2018 2146 11/16/18 0223 11/16/18 0718  HGB 12.4*  --   --   HCT 39.0  --   --   PLT 232  --   --   APTT 28  --   --   LABPROT 14.0  --   --   INR 1.1  --   --   HEPARINUNFRC  --   --  0.53  CREATININE 1.58* 2.38*  --   TROPONINI 0.34* 0.98*  --     Estimated Creatinine Clearance: 33.1 mL/min (A) (by C-G formula based on SCr of 2.38 mg/dL (H)).  Assessment:   73 yr old male on IV heparin for ACS/ NSTEMI.      Initial heparin level is therapeutic (0.53) on 1100 units/hr.  Goal of Therapy:  Heparin level 0.3-0.7 units/ml Monitor platelets by anticoagulation protocol: Yes   Plan:   Continue heparin drip at 1100 units/hr  Repeat heparin level later today.  Daily heparin level and CBC while on heparin.  Dennie Fetters, [place] Pager: [phone] or phone: [phone] 11/16/2018,8:26 AM

## 2018-11-16 NOTE — Consult Note (Signed)
NAME:  Eric Spencer, MRN:  [id number], DOB:  [birth year], LOS: 0 ADMISSION DATE:  10/29/2018, CONSULTATION DATE:  11/16/18 REFERRING MD:  Dr. Delton SeeNelson, CHIEF COMPLAINT:  SOB   Brief History   73 y/o M, former smoker (quit 3 years ago), [facility]. Work up concerning for possible AECOPD, NSTEMI, AKI.   History of present illness   73 y/o M, former smoker (quit 3 years ago), who [facility] with reports of shortness of breath.  The patient reports he is followed by Dr. Sherene SiresWert for COPD.  He takes his medications regularly and participates in pulmonary rehab (feels he does much better when in rehab).  At baseline, he is independent of ADL's.  He does have some limitations due to bilateral shoulder arthritis but is still able to drive his tractor.  He worked out in the yard all day on 5/22 and feels he overdid it.  After going into the house, he noted he had worsening shortness of breath.  Office notes reviewed which show he called on 5/21 with reports of worsening dyspnea and started taking prednisone.  He tried an albuterol nebulization which initially helped but he climbed the stairs and felt SOB again and it did not resolve. States he has been having acid reflux in the last few weeks.  Additionally, increased bilateral shoulder pain and has been taking 600 mg Ibuprofen daily for at least 72 hours.     Initial work up notable for CXR with interstitial edema but no infiltrate.  He was tachycardic with rates to 130's. Initial labs - ABG 7.325 / 32 / 94 / 16.2.  Na 138, K 4.5, Cl 104, CO2 15, glucose 238, BUN 37, Cr 2.38 (up from 1.58), BNP 1,543, troponin 0.34 > 0.98, WBC 10, Hgb 12.4 and platelets 232. COVID testing negative.  He reported chest discomfort (points to sub-xyphoid / epigastric area. EKG had ST changes and [facility].  He was placed on BiPAP on arrival for increased WOB with improvement.    Past  Medical History  COPD  HTN  Allergies  Arthritis   Significant Hospital Events   5/23 Admit   Consults:  PCCM  Procedures:    Significant Diagnostic Tests:    Micro Data:  COVID 5/22 >> negative  BCx2 5/23 >>   Antimicrobials:  Doxycycline 5/23 >>   Interim history/subjective:  Pt reports bipap has helped significantly with work of breathing.  More comfortable.   Objective   Blood pressure 106/71, pulse (!) 123, temperature (!) 97.5 F (36.4 C), temperature source Oral, resp. rate (!) 21, height 6\' 2"  (1.88 m), weight 90.7 kg, SpO2 99 %.       No intake or output data in the 24 hours ending 11/16/18 0448 Filed Weights   11/09/2018 2125  Weight: 90.7 kg    Examination: General: elderly male lying in bed in NAD on BiPAP HEENT: MM pink/moist, BiPAP mask in place, dentures  Neuro: AAOx4, speech clear, MAE, non-focal exam  CV: s1s2 rrr, no m/r/g PULM: prolonged exp phase, diminished breath sounds bilaterally  GI: soft, non-tender, bsx4 active, no pulsatile masses  Extremities: warm/dry, no edema  Skin: no rashes or lesions  Resolved Hospital Problem list      Assessment & Plan:   Acute Exacerbation of COPD  -possible environmental trigger / allergies, GERD  -COVID negative  P: Continue BiPAP for now, ok to cycle  on / off for work of breathing  Wean O2 for sats 88-95% Brovana + Pulmicort  Duoneb scheduled for 1st 24 hours  IV solumedrol with transition to PO prednisone for 5 days total Doxycycline D1/x, consider 5 days total  Progress mobility as able  Pulmonary hygiene-IS, mobilize  Add claritin   NSTEMI  P: Per Cardiology  Heparin gtt per pharmacy  Trend troponin Assess ECHO ASA   AKI -suspect in setting of NSAID use Elevated Lactic Acid  -suspect in setting of increased WOB, slow clearance with AKI  P: Trend BMP / urinary output Replace electrolytes as indicated Avoid nephrotoxic agents, ensure adequate renal perfusion Gentle IVF's Trend  lactate   GERD P: BID Pepcid   Best practice:  Diet: As tolerated  Pain/Anxiety/Delirium protocol (if indicated): n/a VAP protocol (if indicated): na/ DVT prophylaxis: heparin gtt  GI prophylaxis: pepcid BID  Glucose control: per primary  Mobility: as tolerated  Code Status: Full Code  Family Communication: Patient updated on plan of care Disposition: Per Cardiology.   Labs   CBC: Recent Labs  Lab 11/22/2018 2146  WBC 10.0  NEUTROABS 8.9*  HGB 12.4*  HCT 39.0  MCV 94.0  PLT 232    Basic Metabolic Panel: Recent Labs  Lab 11/13/2018 2146 11/16/18 0223  NA 136 138  K 4.1 4.5  CL 104 104  CO2 20* 15*  GLUCOSE 190* 238*  BUN 37* 37*  CREATININE 1.58* 2.38*  CALCIUM 8.9 9.3  MG  --  2.3   GFR: Estimated Creatinine Clearance: 33.1 mL/min (A) (by C-G formula based on SCr of 2.38 mg/dL (H)). Recent Labs  Lab 10/30/2018 2146 11/16/18 0234  WBC 10.0  --   LATICACIDVEN  --  8.2*    Liver Function Tests: Recent Labs  Lab 11/16/18 0223  AST 27  ALT 19  ALKPHOS 58  BILITOT 0.6  PROT 7.1  ALBUMIN 3.6   No results for input(s): LIPASE, AMYLASE in the last 168 hours. No results for input(s): AMMONIA in the last 168 hours.  ABG    Component Value Date/Time   PHART 7.325 (L) 11/16/2018 0220   PCO2ART 32.1 11/16/2018 0220   PO2ART 94.5 11/16/2018 0220   HCO3 16.2 (L) 11/16/2018 0220   ACIDBASEDEF 8.6 (H) 11/16/2018 0220   O2SAT 96.3 11/16/2018 0220     Coagulation Profile: Recent Labs  Lab 11/05/2018 2146  INR 1.1    Cardiac Enzymes: Recent Labs  Lab 11/22/2018 2146 11/16/18 0223  TROPONINI 0.34* 0.98*    HbA1C: Hgb A1c MFr Bld  Date/Time Value Ref Range Status  11/16/2018 02:23 AM 5.6 4.8 - 5.6 % Final    Comment:    (NOTE) Pre diabetes:          5.7%-6.4% Diabetes:              >6.4% Glycemic control for   <7.0% adults with diabetes   01/13/2016 03:44 PM 5.3 <5.7 % Final    Comment:      For the purpose of screening for the presence of  diabetes:   <5.7%       Consistent with the absence of diabetes 5.7-6.4 %   Consistent with increased risk for diabetes (prediabetes) >=6.5 %     Consistent with diabetes   This assay result is consistent with a decreased risk of diabetes.   Currently, no consensus exists regarding use of hemoglobin A1c for diagnosis of diabetes in children.   According to American Diabetes Association (ADA)  guidelines, hemoglobin A1c <7.0% represents optimal control in non-pregnant diabetic patients. Different metrics may apply to specific patient populations. Standards of Medical Care in Diabetes (ADA).       CBG: No results for input(s): GLUCAP in the last 168 hours.  Review of Systems: Positives in [place]  Gen: Denies fever, chills, weight change, fatigue, night sweats HEENT: Denies blurred vision, double vision, hearing loss, tinnitus, sinus congestion, rhinorrhea, sore throat, neck stiffness, dysphagia PULM: Denies shortness of breath, cough, sputum production, hemoptysis, wheezing CV: Denies chest discomfort, edema, orthopnea, paroxysmal nocturnal dyspnea, palpitations GI: Denies abdominal pain, nausea, vomiting, diarrhea, hematochezia, melena, constipation, change in bowel habits. Acid reflux.   GU: Denies dysuria, hematuria, polyuria, oliguria, urethral discharge Endocrine: Denies hot or cold intolerance, polyuria, polyphagia or appetite change Derm: Denies rash, dry skin, scaling or peeling skin change Heme: Denies easy bruising, bleeding, bleeding gums Neuro: Denies headache, numbness, weakness, slurred speech, loss of memory or consciousness   Past Medical History  He,  has a past medical history of Arthritis, Asthma, COPD (chronic obstructive pulmonary disease) (HCC), and Hypertension.   Surgical History    Past Surgical History:  Procedure Laterality Date  . ADENOIDECTOMY    . HERNIA REPAIR    . TONSILLECTOMY       Social History   reports that he quit smoking about 3 years  ago. His smoking use included cigarettes. He has a 72.00 pack-year smoking history. He has never used smokeless tobacco. He reports that he does not drink alcohol or use drugs.   Family History   His family history includes Arthritis in his father; Diabetes in his maternal grandmother; Hypertension in his mother; Stroke in his father. There is no history of Allergic rhinitis, Asthma, Eczema, or Angioedema.   Allergies Allergies  Allergen Reactions  . Penicillins     Childhood allergy- unaware of reaction     Home Medications  Prior to Admission medications   Medication Sig Start Date End Date Taking? Authorizing Provider  albuterol (PROVENTIL) (2.5 MG/3ML) 0.083% nebulizer solution Take 3 mLs (2.5 mg total) by nebulization every 6 (six) hours as needed for wheezing or shortness of breath. 11/05/18   Nyoka Cowden, MD  amLODipine (NORVASC) 10 MG tablet TAKE 1 TABLET BY MOUTH ONCE DAILY. 08/29/18   Donita Brooks, MD  arformoterol (BROVANA) 15 MCG/2ML NEBU Take 2 mLs (15 mcg total) by nebulization 2 (two) times daily. 03/18/18   Nyoka Cowden, MD  bisoprolol (ZEBETA) 5 MG tablet Take 1 tablet (5 mg total) by mouth daily. 11/09/17   Nyoka Cowden, MD  budesonide (PULMICORT) 0.25 MG/2ML nebulizer solution One twice daily with brovana 08/26/18   Nyoka Cowden, MD  cetirizine (ZYRTEC) 10 MG tablet Take 1 tablet (10 mg total) by mouth daily. 09/02/18   Alfonse Spruce, MD  clotrimazole (MYCELEX) 10 MG troche Take 1 tablet (10 mg total) by mouth 4 (four) times daily as needed. 11/05/18   Nyoka Cowden, MD  dextromethorphan-guaiFENesin (MUCINEX DM) 30-600 MG 12hr tablet Take 1 tablet by mouth 2 (two) times daily.    [provider]  famotidine (PEPCID) 20 MG tablet TAKE ONE TABLET BY MOUTH AT BEDTIME. 11/11/18   Nyoka Cowden, MD  ibuprofen (ADVIL,MOTRIN) 200 MG tablet Take 200 mg by mouth every 6 (six) hours as needed.    [provider]  montelukast (SINGULAIR) 10 MG  tablet TAKE 1 TABLET BY MOUTH ONCE DAILY. 03/12/18   Nyoka Cowden, MD  pantoprazole (PROTONIX) 40 MG tablet TAKE ONE TABLET BY MOUTH DAILY 30 TO 60 MINUTES BEFORE FIRST MEAL OF THE DAY. 10/18/18   Nyoka Cowden, MD  predniSONE (DELTASONE) 10 MG tablet Two with bfast daily until better,  Then one daily x 5 days and stop 08/26/18   Nyoka Cowden, MD  Respiratory Therapy Supplies (FLUTTER) DEVI 1 Device by Does not apply route as needed. 10/25/17   Nyoka Cowden, MD  Revefenacin (YUPELRI) 175 MCG/3ML SOLN Inhale 3 mLs into the lungs daily. 03/18/18   Nyoka Cowden, MD  Roflumilast (DALIRESP) 250 MCG TABS Take 250 mcg by mouth daily. 07/24/18   Parrett, Virgel Bouquet, NP  triamcinolone (NASACORT) 55 MCG/ACT AERO nasal inhaler Place 2 sprays into the nose daily.    [provider]  triamterene-hydrochlorothiazide Joseph Pierini) 37.5-25 MG tablet One daily as needed for leg swelling 04/15/18   Nyoka Cowden, MD  VENTOLIN HFA 108 (90 Base) MCG/ACT inhaler INHALE 1 TO 2 PUFFS INTO LUNGS EVERY 4 HOURS AS NEEDED. 10/18/18   Nyoka Cowden, MD     Critical care time: n/a    Canary Brim, NP-C [facility] Pgr: [phone] or if no answer [phone] 11/16/2018, 4:48 AM

## 2018-11-16 NOTE — Progress Notes (Signed)
Pt taken off BIPAP and placed on 4L nasal cannula. . RT will continue to monitor.

## 2018-11-16 NOTE — Progress Notes (Signed)
Initial Nutrition Assessment  RD working remotely.  DOCUMENTATION CODES:   Not applicable, unable to assess for malnutrition at this time  INTERVENTION:   - Once diet advanced, Ensure Enlive po BID, each supplement provides 350 kcal and 20 grams of protein  - MVI with minerals daily  NUTRITION DIAGNOSIS:   Increased nutrient needs related to chronic illness (COPD) as evidenced by estimated needs.  GOAL:   Patient will meet greater than or equal to 90% of their needs  MONITOR:   Diet advancement, Weight trends, Labs, I & O's  REASON FOR ASSESSMENT:   Consult COPD Protocol  ASSESSMENT:   73 year old male who presented to the ED on 5/22 with SOB. PMH of COPD, GERD, HTN. Pt admitted with COPD exacerbation, AKI, and NSTEMI.  Unable to speak with pt at this time. Pt on BiPAP. Pt is NPO.  Reviewed weight history in chart. Pt with a 6.4 kg weight loss since 08/26/18. This is a 7.1% weight loss in just under 3 months which is not quite significant for timeframe. It does appear that pt's weight has slowly been trending down over the last 9 months but weight loss is not significant. Pt is at risk for malnutrition.  Once diet advanced, RD to order Ensure Enlive to aid pt in meeting increased kcal and protein needs. Will also order daily MVI with minerals.  Medications reviewed and include: Pepcid, Solu-medrol, Protonix, IV heparin  Labs reviewed: BUN 37 (H), creatinine 2.38 (H), LDL 190 (H)  NUTRITION - FOCUSED PHYSICAL EXAM:  Unable to complete at this time. RD working remotely.  Diet Order:   Diet Order            Diet NPO time specified  Diet effective now              EDUCATION NEEDS:   Not appropriate for education at this time  Skin:  Skin Assessment: Reviewed RN Assessment  Last BM:  11/19/2018  Height:   Ht Readings from Last 1 Encounters:  10/28/2018 6\' 2"  (1.88 m)    Weight:   Wt Readings from Last 1 Encounters:  11/16/18 83.2 kg    Ideal Body  Weight:  86.4 kg  BMI:  Body mass index is 23.55 kg/m.  Estimated Nutritional Needs:   Kcal:  2000-2200  Protein:  100-115 grams  Fluid:  >/= 2.0 L    Earma Reading, MS, RD, LDN Inpatient Clinical Dietitian Pager: [phone] Weekend/After Hours: [phone]

## 2018-11-16 NOTE — Progress Notes (Signed)
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  [facility] with severe shortness of breath as well as chest pain.  He had minimal troponin elevation.  [facility].  The patient has severe COPD.  He sees Dr. Sherene SiresWert in the pulmonary department.  [place]  He seems to have improved slightly.  He has been started on antibiotics.  Still wheezing quite a bit    Inpatient Medications    Scheduled Meds: . arformoterol  15 mcg Nebulization BID  . [START ON 10/26/2018] aspirin EC  81 mg Oral Daily  . atorvastatin  80 mg Oral q1800  . budesonide (PULMICORT) nebulizer solution  0.5 mg Nebulization BID  . famotidine  20 mg Oral QHS  . feeding supplement (ENSURE ENLIVE)  237 mL Oral BID BM  . loratadine  10 mg Oral Daily  . methylPREDNISolone (SOLU-MEDROL) injection  40 mg Intravenous Q12H  . [START ON 11/06/2018] multivitamin with minerals  1 tablet Oral Daily  . pantoprazole  40 mg Oral BID AC   Continuous Infusions: . heparin 1,100 Units/hr (11/16/18 0458)   PRN Meds: acetaminophen, ipratropium-albuterol, nitroGLYCERIN, ondansetron (ZOFRAN) IV   Vital Signs    Vitals:   11/16/18 0533 11/16/18 0619 11/16/18 0809 11/16/18 1138  BP: 101/72 95/71  126/79  Pulse: (!) 108 (!) 111 (!) 139 (!) 118  Resp: 16 18 (!) 31 (!) 22  Temp:  (!) 97.5 F (36.4 C)  98.1 F (36.7 C)  TempSrc:  Axillary  Axillary  SpO2: 100% 100% 99% 100%  Weight:  83.2 kg    Height:        Intake/Output Summary (Last 24 hours) at 11/16/2018 1310 Last data filed at 11/16/2018 0458 Gross per 24 hour  Intake 648.41 ml  Output -  Net 648.41 ml   Last 3 Weights 11/16/2018 2019/06/15 08/26/2018  Weight (lbs) 183 lb 6.8 oz 200 lb 197 lb 9.6 oz  Weight (kg) 83.2 kg  90.719 kg 89.631 kg      Telemetry    Sinus tach   - Personally Reviewed  ECG       Physical Exam   GEN: middle age man,   Mild - mod respiratory distress   Neck: No JVD Cardiac: RRR,   Tachy   Respiratory: Clear to auscultation bilaterally. GI: Soft, nontender, non-distended  MS: No edema; No deformity. Neuro:  Nonfocal  Psych: Normal affect   Labs    Chemistry Recent Labs  Lab 2019-03-29 2146 11/16/18 0223  NA 136 138  K 4.1 4.5  CL 104 104  CO2 20* 15*  GLUCOSE 190* 238*  BUN 37* 37*  CREATININE 1.58* 2.38*  CALCIUM 8.9 9.3  PROT  --  7.1  ALBUMIN  --  3.6  AST  --  27  ALT  --  19  ALKPHOS  --  58  BILITOT  --  0.6  GFRNONAA 43* 26*  GFRAA 50* 31*  ANIONGAP 12 19*     Hematology Recent Labs  Lab 2019-03-29 2146  WBC 10.0  RBC 4.15*  HGB 12.4*  HCT 39.0  MCV 94.0  MCH 29.9  MCHC 31.8  RDW 15.1  PLT 232    Cardiac Enzymes Recent Labs  Lab 2019-03-29 2146  11/16/18 0223 11/16/18 0718  TROPONINI 0.34* 0.98* 3.29*   No results for input(s): TROPIPOC in the last 168 hours.   BNP Recent Labs  Lab 11/16/18 0223  BNP 1,543.0*     DDimer No results for input(s): DDIMER in the last 168 hours.   Radiology    Dg Chest Port 1 View  Result Date: 10/26/2018 CLINICAL DATA:  Initial evaluation for acute chest pain with shortness of breath. History of COPD. EXAM: PORTABLE CHEST 1 VIEW COMPARISON:  Prior radiograph from 04/15/2018 FINDINGS: Cardiac and mediastinal silhouettes stable in size and contour, and remain within normal limits. Lungs are hyperinflated with changes compatible with COPD. Superimposed diffuse increased prominence of the interstitial markings with perihilar vascular congestion, most suggestive of superimposed pulmonary interstitial edema. No consolidative opacity. Possible trace right pleural effusion. No pneumothorax. No acute osseous finding. IMPRESSION: 1. COPD with superimposed mild diffuse pulmonary interstitial edema. 2.  Possible trace right pleural effusion. Electronically Signed   By: Rise Mu M.D.   On: 11/09/2018 22:18    Cardiac Studies      Patient Profile     73 y.o. male with severe lung disease, transferred from [facility] hosp with severe respiratory distress and + troponins  Assessment & Plan    1.   Severe respiratory distress.   Seems like a COPD exacerbation To me.  He is improved with antibiotics and Solu-Medrol.   His lactic acid level is high.  His troponins have increased to 3.29. Continue supportive care.  Appreciate the assistance of the pulmonary team.  2.  Non-ST segment elevation myocardial infarction.  His troponin is up to 3.29.  This could be all due to demand ischemia given his severe pulmonary disease but the troponin levels are somewhat higher than we would anticipate.  We will anticipate doing a heart catheterization on Tuesday.  3.  Tachycardia:   Doubt he would tolerate a beta blocker.   Will consider Diltiazem if his EF is normal (echo has been ordered )   For questions or updates, please contact [facility] Please consult [email or web address] for contact info under        Signed, Kristeen Miss, MD  11/16/2018, 1:10 PM

## 2018-11-16 NOTE — Progress Notes (Signed)
ANTICOAGULATION CONSULT NOTE - Follow Up Consult  Pharmacy Consult for Heparin Indication: chest pain/ACS  Allergies  Allergen Reactions  . Penicillins     Childhood allergy- unaware of reaction    Patient Measurements: Height: 6\' 2"  (188 cm) Weight: 183 lb 6.8 oz (83.2 kg) IBW/kg (Calculated) : 82.2 Heparin Dosing Weight: 83.2 kg  Vital Signs: Temp: 98.1 F (36.7 C) (05/23 1138) Temp Source: Axillary (05/23 1138) BP: 126/79 (05/23 1138) Pulse Rate: 95 (05/23 1519)  Labs: Recent Labs    11/22/2018 2146 11/16/18 0223 11/16/18 0718 11/16/18 1417  HGB 12.4*  --   --   --   HCT 39.0  --   --   --   PLT 232  --   --   --   APTT 28  --   --   --   LABPROT 14.0  --   --   --   INR 1.1  --   --   --   HEPARINUNFRC  --   --  0.53 0.61  CREATININE 1.58* 2.38*  --   --   TROPONINI 0.34* 0.98* 3.29*  --     Estimated Creatinine Clearance: 33.1 mL/min (A) (by C-G formula based on SCr of 2.38 mg/dL (H)).  Assessment:  Anticoag: Heparin for ACS/NSTEMI.   Initial HL 0.53, confirmed 0.61 remains in goal range.  Goal of Therapy:  Heparin level 0.3-0.7 units/ml Monitor platelets by anticoagulation protocol: Yes   Plan:  Continue heparin drip at 1100 units/hr Daily HL and CBC  Shakea Isip S. Merilynn Finland, PharmD, BCPS Clinical Staff Pharmacist Misty [facility] Stillinger 11/16/2018,3:24 PM

## 2018-11-16 NOTE — H&P (Addendum)
History & Physical    Patient ID: Eric Spencer MRN: [id number], DOB/AGE: 03/02/1946   Admit date: 10/27/2018   Primary Physician: Patient, No Pcp Per Primary Cardiologist: No primary care provider on file.  Patient Profile    Eric Spencer is a 73 year old gentleman with COPD, HTN who presents due to severe shortness of breath.   Past Medical History    Past Medical History:  Diagnosis Date   Arthritis    Asthma    COPD (chronic obstructive pulmonary disease) (HCC)    Hypertension     Past Surgical History:  Procedure Laterality Date   ADENOIDECTOMY     HERNIA REPAIR     TONSILLECTOMY       Allergies  Allergies  Allergen Reactions   Penicillins     Childhood allergy- unaware of reaction    History of Present Illness    Eric Spencer is a 73 year old gentleman with COPD, HTN who presents due to severe shortness of breath.   The patient reports that over the past several days he has had increasing shortness of breath and cough. Reports using his inhalers at home for COPD and improvement in breathing with use of albuterol. Also notes an episode two days ago of severe chest pain, resolved in approximately 30 minutes. Has not had further episodes of severe pain but has had mild substernal chest discomfort. Presented today due to significant shortness of breath that was not improved with use of his inhalers. Also noted minimal CP prior to arrival to [facility] ED but this resolved completely with administration of aspirin. In the ED, patient was found to have a troponin of 0.34 which prompted a consult with myself. At that time, ECG from one hour prior was found to have ECG changes concerning for STE in lateral leads with reciprocal depressions. ECG was discussed with Dr. Allyson SabalBerry, the on call interventionalist, but plan was made to not activate the cath lab in the setting of respiratory symptoms and lack of chest pain. The patient was transferred to [facility] for  further evaluation.  Notably, the patient reports a similar hospitalization for respiratory distress approximately one year ago. He follows with pulmonology and has a known FEV1 of 29% pred, DLCO 37%.   Upon arrival to [facility], the patient appeared to be in respiratory distress with significantly elevated respiratory rate and poor movement of air bilaterally with end expiratory wheezing on examination. ECG showed similar findings to the prior ECG. STAT labs revealed lactate elevated at 8.3, BNP of 1543, Troponin 0.98. ABG 7.32/32/94.5 on 2L [place]. Shortly after arrival, patient also with episode of NSVT. He continued to deny any chest pain.   Home Medications    Prior to Admission medications   Medication Sig Start Date End Date Taking? Authorizing Provider  albuterol (PROVENTIL) (2.5 MG/3ML) 0.083% nebulizer solution Take 3 mLs (2.5 mg total) by nebulization every 6 (six) hours as needed for wheezing or shortness of breath. 11/05/18   Nyoka CowdenWert, Michael B, MD  amLODipine (NORVASC) 10 MG tablet TAKE 1 TABLET BY MOUTH ONCE DAILY. 08/29/18   Donita BrooksPickard, Warren T, MD  arformoterol (BROVANA) 15 MCG/2ML NEBU Take 2 mLs (15 mcg total) by nebulization 2 (two) times daily. 03/18/18   Nyoka CowdenWert, Michael B, MD  bisoprolol (ZEBETA) 5 MG tablet Take 1 tablet (5 mg total) by mouth daily. 11/09/17   Nyoka CowdenWert, Michael B, MD  budesonide (PULMICORT) 0.25 MG/2ML nebulizer solution One twice daily with brovana  08/26/18   Nyoka Cowden, MD  cetirizine (ZYRTEC) 10 MG tablet Take 1 tablet (10 mg total) by mouth daily. 09/02/18   Alfonse Spruce, MD  clotrimazole (MYCELEX) 10 MG troche Take 1 tablet (10 mg total) by mouth 4 (four) times daily as needed. 11/05/18   Nyoka Cowden, MD  dextromethorphan-guaiFENesin (MUCINEX DM) 30-600 MG 12hr tablet Take 1 tablet by mouth 2 (two) times daily.    [provider]  famotidine (PEPCID) 20 MG tablet TAKE ONE TABLET BY MOUTH AT BEDTIME. 11/11/18   Nyoka Cowden, MD  ibuprofen  (ADVIL,MOTRIN) 200 MG tablet Take 200 mg by mouth every 6 (six) hours as needed.    [provider]  montelukast (SINGULAIR) 10 MG tablet TAKE 1 TABLET BY MOUTH ONCE DAILY. 03/12/18   Nyoka Cowden, MD  pantoprazole (PROTONIX) 40 MG tablet TAKE ONE TABLET BY MOUTH DAILY 30 TO 60 MINUTES BEFORE FIRST MEAL OF THE DAY. 10/18/18   Nyoka Cowden, MD  predniSONE (DELTASONE) 10 MG tablet Two with bfast daily until better,  Then one daily x 5 days and stop 08/26/18   Nyoka Cowden, MD  Respiratory Therapy Supplies (FLUTTER) DEVI 1 Device by Does not apply route as needed. 10/25/17   Nyoka Cowden, MD  Revefenacin (YUPELRI) 175 MCG/3ML SOLN Inhale 3 mLs into the lungs daily. 03/18/18   Nyoka Cowden, MD  Roflumilast (DALIRESP) 250 MCG TABS Take 250 mcg by mouth daily. 07/24/18   Parrett, Virgel Bouquet, NP  triamcinolone (NASACORT) 55 MCG/ACT AERO nasal inhaler Place 2 sprays into the nose daily.    [provider]  triamterene-hydrochlorothiazide Joseph Pierini) 37.5-25 MG tablet One daily as needed for leg swelling 04/15/18   Nyoka Cowden, MD  VENTOLIN HFA 108 (90 Base) MCG/ACT inhaler INHALE 1 TO 2 PUFFS INTO LUNGS EVERY 4 HOURS AS NEEDED. 10/18/18   Nyoka Cowden, MD    Family History    Family History  Problem Relation Age of Onset   Hypertension Mother    Arthritis Father    Stroke Father    Diabetes Maternal Grandmother    Allergic rhinitis Neg Hx    Asthma Neg Hx    Eczema Neg Hx    Angioedema Neg Hx    He indicated that the status of his mother is unknown. He indicated that the status of his father is unknown. He indicated that the status of his maternal grandmother is unknown. He indicated that the status of his neg hx is unknown.   Social History    Social History   Socioeconomic History   Marital status: Married    Spouse name: Not on file   Number of children: Not on file   Years of education: Not on file   Highest education level: Not on file    Occupational History   Occupation: Linemen  Social Needs   Financial resource strain: Not on file   Food insecurity:    Worry: Not on file    Inability: Not on file   Transportation needs:    Medical: Not on file    Non-medical: Not on file  Tobacco Use   Smoking status: Former Smoker    Packs/day: 1.50    Years: 48.00    Pack years: 72.00    Types: Cigarettes    Last attempt to quit: 10/28/2015    Years since quitting: 3.0   Smokeless tobacco: Never Used  Substance and Sexual Activity   Alcohol  use: No   Drug use: No   Sexual activity: Not on file  Lifestyle   Physical activity:    Days per week: Not on file    Minutes per session: Not on file   Stress: Not on file  Relationships   Social connections:    Talks on phone: Not on file    Gets together: Not on file    Attends religious service: Not on file    Active member of club or organization: Not on file    Attends meetings of clubs or organizations: Not on file    Relationship status: Not on file   Intimate partner violence:    Fear of current or ex partner: Not on file    Emotionally abused: Not on file    Physically abused: Not on file    Forced sexual activity: Not on file  Other Topics Concern   Not on file  Social History Narrative   Not on file     Review of Systems    General:  No chills, fever, night sweats or weight changes.  Cardiovascular:  No chest pain, dyspnea on exertion, edema, orthopnea, palpitations, paroxysmal nocturnal dyspnea. Dermatological: No rash, lesions/masses Respiratory: No cough, dyspnea Urologic: No hematuria, dysuria Abdominal:   No nausea, vomiting, diarrhea, bright red blood per rectum, melena, or hematemesis Neurologic:  No visual changes, wkns, changes in mental status. All other systems reviewed and are otherwise negative except as noted above.  Physical Exam    Blood pressure (!) 103/56, pulse (!) 123, temperature (!) 97.5 F (36.4 C), temperature  source Oral, resp. rate (!) 21, height 6\' 2"  (1.88 m), weight 90.7 kg, SpO2 99 %.  General: Pleasant, appears in moderate respiratory distress Psych: Normal affect. Neuro: Alert and oriented X 3. Moves all extremities spontaneously. HEENT: Normal  Neck: Supple without bruits or JVD. Lungs:  Resp somewhat labored, Poor movement of air throughout with end expiratory wheezing Heart: Tachycardic. No m/r/g. Abdomen: Soft, non-tender, non-distended, BS + x 4.  Extremities: No clubbing, cyanosis or edema. DP/PT/Radials 2+ and equal bilaterally.  Labs    Troponin (Point of Care Test) No results for input(s): TROPIPOC in the last 72 hours. Recent Labs    11/02/2018 2146  TROPONINI 0.34*   Lab Results  Component Value Date   WBC 10.0 11/16/2018   HGB 12.4 (L) 10/30/2018   HCT 39.0 11/14/2018   MCV 94.0 10/26/2018   PLT 232 11/08/2018    Recent Labs  Lab 10/26/2018 2146  NA 136  K 4.1  CL 104  CO2 20*  BUN 37*  CREATININE 1.58*  CALCIUM 8.9  GLUCOSE 190*   Lab Results  Component Value Date   CHOL 261 (H) 11/16/2018   HDL 51 11/16/2018   LDLCALC 190 (H) 11/16/2018   TRIG 98 11/16/2018   No results found for: [facility]   Radiology Studies    Dg Chest Port 1 View  Result Date: 11/12/2018 CLINICAL DATA:  Initial evaluation for acute chest pain with shortness of breath. History of COPD. EXAM: PORTABLE CHEST 1 VIEW COMPARISON:  Prior radiograph from 04/15/2018 FINDINGS: Cardiac and mediastinal silhouettes stable in size and contour, and remain within normal limits. Lungs are hyperinflated with changes compatible with COPD. Superimposed diffuse increased prominence of the interstitial markings with perihilar vascular congestion, most suggestive of superimposed pulmonary interstitial edema. No consolidative opacity. Possible trace right pleural effusion. No pneumothorax. No acute osseous finding. IMPRESSION: 1. COPD with superimposed mild diffuse pulmonary interstitial  edema. 2. Possible  trace right pleural effusion. Electronically Signed   By: Rise Mu M.D.   On: 11/22/2018 22:18    ECG & Cardiac Imaging    ECG - personally reviewed. Sinus tachycardia. STE AVL and V2, V3. ST depression inferiorly.  Assessment & Plan    Mr. Inez Catalina is a 73 year old gentleman with COPD, HTN who presents due to severe shortness of breath. Found to have elevated troponin and concerning ECG changes thus transferred for management of NSTEMI. Further work-up revealed severe COPD exacerbation and shock.   # Shock # COPD Exacerbation # Stage IV COPD # Possible sepsis Patient with significant shortness of breath which was primary symptom leading to presentation. Also with cough, sputum production consistent with COPD exacerbation. Upon arrival to [facility], patient found to be in respiratory distress requiring bipap, nebulizers and lactate elevated to 8.2. Also with borderline hypotension where patient usually requires 3 anti-hypertensives at home. Tachycardic to 120s. Patient is not significantly hypoxic which makes PE less likely but anticoagulated with heparin nonetheless in the setting of NSTEMI. No infiltrate on CXR. COVID negative.  - Will treat with duonebs prn - Patient placed on Bipap on arrival given work of breath, now significantly improved and can likely wean - IV solumedrol x1 followed by daily pred x4 days - Blood cultures x2 - Sputum culture - Will start on empiric doxycycline in setting of severe COPD exacerbation (pcn allergy) - NS bolus now - Critical care medicine consulted for further assistance  # NSTEMI Patient reports episode of severe chest pain two days ago and minimal chest pain on the day of presentation. Reported to the ED due to shortness of breath. ECG with changes concerning for ischemia but on further discussion with Dr. Allyson Sabal, given resolution of chest pain did not activate cath lab. After arrival to [facility] patient continued to deny chest pain, repeat  troponin 0.34-->0.98 consistent with likely type II event in the setting of severe COPD exacerbation. - ASA  daily, received  in the ED - heparin gtt - Hold on BB given borderline BPs  - Atorvastatin  - A1C, lipid panel, TFTs  - TTE in AM - Does have significant risk factors for coronary disease. Consider ischemic evaluation following improvement of COPD exacerbation.   # AKI Patient with increasing Cr from 1.6 on arrival to ED now up to 2.4. Likely 2/2 hypovolemia - bolus IVF as above  # HTN BP borderline hypotensive on arrival - Hold home antihypertensives  # NPO for now  # Full code  Signed, Starlyn Skeans, MD 11/16/2018, 3:30 AM

## 2018-11-16 NOTE — Progress Notes (Signed)
Attempted patient off BIPAP, pt unable to tolerate. In less than 5 minutes patient was gasping for air and requested to be placed back on BIPAP. RT will continue to monitor.

## 2018-11-17 ENCOUNTER — Inpatient Hospital Stay (HOSPITAL_COMMUNITY): Payer: Medicare Other

## 2018-11-17 ENCOUNTER — Encounter (HOSPITAL_COMMUNITY): Admission: EM | Disposition: E | Payer: Self-pay | Source: Home / Self Care | Attending: Cardiology

## 2018-11-17 ENCOUNTER — Other Ambulatory Visit: Payer: Self-pay

## 2018-11-17 DIAGNOSIS — I255 Ischemic cardiomyopathy: Secondary | ICD-10-CM

## 2018-11-17 DIAGNOSIS — R570 Cardiogenic shock: Secondary | ICD-10-CM

## 2018-11-17 DIAGNOSIS — Z95811 Presence of heart assist device: Secondary | ICD-10-CM

## 2018-11-17 DIAGNOSIS — I10 Essential (primary) hypertension: Secondary | ICD-10-CM

## 2018-11-17 DIAGNOSIS — I340 Nonrheumatic mitral (valve) insufficiency: Secondary | ICD-10-CM

## 2018-11-17 DIAGNOSIS — L8990 Pressure ulcer of unspecified site, unspecified stage: Secondary | ICD-10-CM

## 2018-11-17 DIAGNOSIS — I480 Paroxysmal atrial fibrillation: Secondary | ICD-10-CM

## 2018-11-17 HISTORY — PX: RIGHT/LEFT HEART CATH AND CORONARY/GRAFT ANGIOGRAPHY: CATH118267

## 2018-11-17 HISTORY — PX: VENTRICULAR ASSIST DEVICE INSERTION: CATH118273

## 2018-11-17 LAB — POCT I-STAT 7, (LYTES, BLD GAS, ICA,H+H)
Acid-base deficit: 6 mmol/L — ABNORMAL HIGH (ref 0.0–2.0)
Acid-base deficit: 3 mmol/L — ABNORMAL HIGH (ref 0.0–2.0)
Bicarbonate: 18.8 mmol/L — ABNORMAL LOW (ref 20.0–28.0)
Bicarbonate: 22.1 mmol/L (ref 20.0–28.0)
Calcium, Ion: 1.22 mmol/L (ref 1.15–1.40)
Calcium, Ion: 1.17 mmol/L (ref 1.15–1.40)
HCT: 35 % — ABNORMAL LOW (ref 39.0–52.0)
HCT: 30 % — ABNORMAL LOW (ref 39.0–52.0)
Hemoglobin: 11.9 g/dL — ABNORMAL LOW (ref 13.0–17.0)
Hemoglobin: 10.2 g/dL — ABNORMAL LOW (ref 13.0–17.0)
O2 Saturation: 95 %
O2 Saturation: 100 %
Patient temperature: 98.6
Potassium: 4.9 mmol/L (ref 3.5–5.1)
Potassium: 4.7 mmol/L (ref 3.5–5.1)
Sodium: 137 mmol/L (ref 135–145)
Sodium: 136 mmol/L (ref 135–145)
TCO2: 20 mmol/L — ABNORMAL LOW (ref 22–32)
TCO2: 23 mmol/L (ref 22–32)
pCO2 arterial: 34.7 mmHg (ref 32.0–48.0)
pCO2 arterial: 39.1 mmHg (ref 32.0–48.0)
pH, Arterial: 7.341 — ABNORMAL LOW (ref 7.350–7.450)
pH, Arterial: 7.361 (ref 7.350–7.450)
pO2, Arterial: 77 mmHg — ABNORMAL LOW (ref 83.0–108.0)
pO2, Arterial: 510 mmHg — ABNORMAL HIGH (ref 83.0–108.0)

## 2018-11-17 LAB — CBC
HCT: 39 % (ref 39.0–52.0)
HCT: 31.6 % — ABNORMAL LOW (ref 39.0–52.0)
Hemoglobin: 12.5 g/dL — ABNORMAL LOW (ref 13.0–17.0)
Hemoglobin: 10.5 g/dL — ABNORMAL LOW (ref 13.0–17.0)
MCH: 29.9 pg (ref 26.0–34.0)
MCH: 30.5 pg (ref 26.0–34.0)
MCHC: 32.1 g/dL (ref 30.0–36.0)
MCHC: 33.2 g/dL (ref 30.0–36.0)
MCV: 93.3 fL (ref 80.0–100.0)
MCV: 91.9 fL (ref 80.0–100.0)
Platelets: 248 10*3/uL (ref 150–400)
Platelets: 241 10*3/uL (ref 150–400)
RBC: 4.18 MIL/uL — ABNORMAL LOW (ref 4.22–5.81)
RBC: 3.44 MIL/uL — ABNORMAL LOW (ref 4.22–5.81)
RDW: 15.7 % — ABNORMAL HIGH (ref 11.5–15.5)
RDW: 15.7 % — ABNORMAL HIGH (ref 11.5–15.5)
WBC: 27.1 10*3/uL — ABNORMAL HIGH (ref 4.0–10.5)
WBC: 25.8 10*3/uL — ABNORMAL HIGH (ref 4.0–10.5)
nRBC: 0 % (ref 0.0–0.2)
nRBC: 0.1 % (ref 0.0–0.2)

## 2018-11-17 LAB — POCT ACTIVATED CLOTTING TIME
Activated Clotting Time: 213 seconds
Activated Clotting Time: 213 seconds
Activated Clotting Time: 213 seconds
Activated Clotting Time: 197 seconds
Activated Clotting Time: 180 seconds
Activated Clotting Time: 164 seconds
Activated Clotting Time: 164 seconds
Activated Clotting Time: 142 seconds

## 2018-11-17 LAB — LACTIC ACID, PLASMA
Lactic Acid, Venous: 3.7 mmol/L (ref 0.5–1.9)
Lactic Acid, Venous: 3.6 mmol/L (ref 0.5–1.9)

## 2018-11-17 LAB — BLOOD GAS, ARTERIAL
Acid-base deficit: 4.8 mmol/L — ABNORMAL HIGH (ref 0.0–2.0)
Bicarbonate: 19.6 mmol/L — ABNORMAL LOW (ref 20.0–28.0)
Delivery systems: POSITIVE
Drawn by: 24487
Expiratory PAP: 6
FIO2: 0.4
Inspiratory PAP: 12
O2 Saturation: 95.8 %
Patient temperature: 98.6
pCO2 arterial: 35.3 mmHg (ref 32.0–48.0)
pH, Arterial: 7.363 (ref 7.350–7.450)
pO2, Arterial: 85.9 mmHg (ref 83.0–108.0)

## 2018-11-17 LAB — BASIC METABOLIC PANEL
Anion gap: 17 — ABNORMAL HIGH (ref 5–15)
BUN: 62 mg/dL — ABNORMAL HIGH (ref 8–23)
CO2: 17 mmol/L — ABNORMAL LOW (ref 22–32)
Calcium: 9.3 mg/dL (ref 8.9–10.3)
Chloride: 105 mmol/L (ref 98–111)
Creatinine, Ser: 2.56 mg/dL — ABNORMAL HIGH (ref 0.61–1.24)
GFR calc Af Amer: 28 mL/min — ABNORMAL LOW (ref >60)
GFR calc non Af Amer: 24 mL/min — ABNORMAL LOW (ref >60)
Glucose, Bld: 244 mg/dL — ABNORMAL HIGH (ref 70–99)
Potassium: 5.2 mmol/L — ABNORMAL HIGH (ref 3.5–5.1)
Sodium: 139 mmol/L (ref 135–145)

## 2018-11-17 LAB — COOXEMETRY PANEL
Carboxyhemoglobin: 1.1 % (ref 0.5–1.5)
Methemoglobin: 2 % — ABNORMAL HIGH (ref 0.0–1.5)
O2 Saturation: 60.7 %
Total hemoglobin: 10.8 g/dL — ABNORMAL LOW (ref 12.0–16.0)

## 2018-11-17 LAB — ECHOCARDIOGRAM COMPLETE
Height: 74 in
Weight: 2934.76 oz

## 2018-11-17 LAB — HEPARIN LEVEL (UNFRACTIONATED): Heparin Unfractionated: 0.68 IU/mL (ref 0.30–0.70)

## 2018-11-17 LAB — ECHOCARDIOGRAM LIMITED
Height: 74 in
Weight: 2948.87 oz

## 2018-11-17 LAB — TROPONIN I: Troponin I: 10.33 ng/mL (ref <0.03)

## 2018-11-17 LAB — PROCALCITONIN: Procalcitonin: 0.42 ng/mL

## 2018-11-17 SURGERY — RIGHT/LEFT HEART CATH AND CORONARY/GRAFT ANGIOGRAPHY
Anesthesia: LOCAL

## 2018-11-17 MED ORDER — SODIUM CHLORIDE 0.9 % IV BOLUS: 250.0000 mL | Freq: Once | INTRAVENOUS | Status: DC

## 2018-11-17 MED ORDER — FENTANYL CITRATE (PF) 100 MCG/2ML IJ SOLN
50.0000 ug | Freq: Once | INTRAMUSCULAR | Status: AC
  Administered 2018-11-17: 50 ug via INTRAVENOUS

## 2018-11-17 MED ORDER — FUROSEMIDE 10 MG/ML IJ SOLN
40.0000 mg | Freq: Once | INTRAMUSCULAR | Status: AC
  Administered 2018-11-17: 40 mg via INTRAVENOUS

## 2018-11-17 MED ORDER — FENTANYL CITRATE (PF) 100 MCG/2ML IJ SOLN
12.5000 ug | Freq: Once | INTRAMUSCULAR | Status: AC
  Administered 2018-11-17: 12.5 ug via INTRAVENOUS

## 2018-11-17 MED ORDER — IOHEXOL 350 MG/ML SOLN
INTRAVENOUS | Status: DC | PRN
  Administered 2018-11-17: 45 mL via INTRA_ARTERIAL

## 2018-11-17 MED ORDER — SODIUM CHLORIDE 0.9 % IV SOLN: 250.0000 mL | INTRAVENOUS | Status: DC | PRN

## 2018-11-17 MED ORDER — MIDAZOLAM HCL 2 MG/2ML IJ SOLN
2.0000 mg | Freq: Once | INTRAMUSCULAR | Status: AC
  Administered 2018-11-17: 2 mg via INTRAVENOUS

## 2018-11-17 MED ORDER — MIDAZOLAM HCL 2 MG/2ML IJ SOLN
INTRAMUSCULAR | Status: AC
  Filled 2018-11-17: qty 2

## 2018-11-17 MED ORDER — MIDAZOLAM HCL 2 MG/2ML IJ SOLN
INTRAMUSCULAR | Status: DC | PRN
  Administered 2018-11-17: 3 mg via INTRAVENOUS
  Administered 2018-11-17: 2 mg via INTRAVENOUS

## 2018-11-17 MED ORDER — SODIUM CHLORIDE 0.9% FLUSH: 3.0000 mL | INTRAVENOUS | Status: DC | PRN

## 2018-11-17 MED ORDER — NOREPINEPHRINE 4 MG/250ML-% IV SOLN
0.0000 ug/min | INTRAVENOUS | Status: DC
  Administered 2018-11-17: 5 ug/min via INTRAVENOUS
  Administered 2018-11-17: 13:00:00 20 ug/min via INTRAVENOUS
  Filled 2018-11-17: qty 250

## 2018-11-17 MED ORDER — AMIODARONE LOAD VIA INFUSION
150.0000 mg | Freq: Once | INTRAVENOUS | Status: AC
  Administered 2018-11-17: 23:00:00 150 mg via INTRAVENOUS
  Filled 2018-11-17: qty 83.34

## 2018-11-17 MED ORDER — LIDOCAINE HCL (PF) 1 % IJ SOLN
INTRAMUSCULAR | Status: AC
  Filled 2018-11-17: qty 30

## 2018-11-17 MED ORDER — HEPARIN SODIUM (PORCINE) 1000 UNIT/ML IJ SOLN
INTRAMUSCULAR | Status: AC
  Filled 2018-11-17: qty 1

## 2018-11-17 MED ORDER — ONDANSETRON HCL 4 MG/2ML IJ SOLN: 4.0000 mg | Freq: Four times a day (QID) | INTRAMUSCULAR | Status: DC | PRN

## 2018-11-17 MED ORDER — CHLORHEXIDINE GLUCONATE 0.12% ORAL RINSE (MEDLINE KIT)
15.0000 mL | Freq: Two times a day (BID) | OROMUCOSAL | Status: DC
  Administered 2018-11-17 – 2018-11-23 (×13): 15 mL via OROMUCOSAL

## 2018-11-17 MED ORDER — AMIODARONE LOAD VIA INFUSION
150.0000 mg | Freq: Once | INTRAVENOUS | Status: DC
  Administered 2018-11-17: 150 mg via INTRAVENOUS
  Filled 2018-11-17: qty 83.34

## 2018-11-17 MED ORDER — FENTANYL CITRATE (PF) 100 MCG/2ML IJ SOLN
INTRAMUSCULAR | Status: AC
  Filled 2018-11-17: qty 2

## 2018-11-17 MED ORDER — ETOMIDATE 2 MG/ML IV SOLN
20.0000 mg | Freq: Once | INTRAVENOUS | Status: AC
  Administered 2018-11-17: 20 mg via INTRAVENOUS

## 2018-11-17 MED ORDER — MIDAZOLAM HCL 2 MG/2ML IJ SOLN
1.0000 mg | INTRAMUSCULAR | Status: DC | PRN
  Administered 2018-11-17 – 2018-11-21 (×5): 1 mg via INTRAVENOUS

## 2018-11-17 MED ORDER — ROCURONIUM BROMIDE 50 MG/5ML IV SOLN
60.0000 mg | Freq: Once | INTRAVENOUS | Status: AC
  Administered 2018-11-17: 10:00:00 60 mg via INTRAVENOUS

## 2018-11-17 MED ORDER — FUROSEMIDE 10 MG/ML IJ SOLN
INTRAMUSCULAR | Status: AC
  Filled 2018-11-17: qty 4

## 2018-11-17 MED ORDER — FENTANYL CITRATE (PF) 100 MCG/2ML IJ SOLN
INTRAMUSCULAR | Status: AC
  Filled 2018-11-17: qty 4

## 2018-11-17 MED ORDER — HEPARIN (PORCINE) 25000 UT/250ML-% IV SOLN
800.0000 [IU]/h | INTRAVENOUS | Status: DC
  Administered 2018-11-17: 23:00:00 100 [IU]/h via INTRAVENOUS
  Administered 2018-11-18: 1200 [IU]/h via INTRAVENOUS
  Administered 2018-11-20: 02:00:00 900 [IU]/h via INTRAVENOUS
  Filled 2018-11-17 (×2): qty 250

## 2018-11-17 MED ORDER — CHLORHEXIDINE GLUCONATE CLOTH 2 % EX PADS: 6.0000 | MEDICATED_PAD | Freq: Every day | CUTANEOUS | Status: DC

## 2018-11-17 MED ORDER — FENTANYL 2500MCG IN NS 250ML (10MCG/ML) PREMIX INFUSION
25.0000 ug/h | INTRAVENOUS | Status: DC
  Administered 2018-11-17: 25 ug/h via INTRAVENOUS
  Administered 2018-11-19 – 2018-11-21 (×2): 50 ug/h via INTRAVENOUS
  Administered 2018-11-23: 15:00:00 25 ug/h via INTRAVENOUS
  Filled 2018-11-17 (×4): qty 250

## 2018-11-17 MED ORDER — FENTANYL BOLUS VIA INFUSION
25.0000 ug | INTRAVENOUS | Status: DC | PRN
  Administered 2018-11-17: 13:00:00 25 ug via INTRAVENOUS
  Administered 2018-11-17: 50 ug via INTRAVENOUS
  Administered 2018-11-19 – 2018-11-21 (×6): 25 ug via INTRAVENOUS
  Filled 2018-11-17: qty 25

## 2018-11-17 MED ORDER — SENNOSIDES 8.8 MG/5ML PO SYRP
5.0000 mL | ORAL_SOLUTION | Freq: Two times a day (BID) | ORAL | Status: DC | PRN
  Administered 2018-11-18: 10:00:00 5 mL
  Filled 2018-11-17: qty 5

## 2018-11-17 MED ORDER — FENTANYL CITRATE (PF) 100 MCG/2ML IJ SOLN: 25.0000 ug | Freq: Once | INTRAMUSCULAR | Status: DC

## 2018-11-17 MED ORDER — HEPARIN (PORCINE) IN NACL 1000-0.9 UT/500ML-% IV SOLN
INTRAVENOUS | Status: DC | PRN
  Administered 2018-11-17: 500 mL via INTRA_ARTERIAL

## 2018-11-17 MED ORDER — SODIUM CHLORIDE 0.9% FLUSH: 10.0000 mL | INTRAVENOUS | Status: DC | PRN

## 2018-11-17 MED ORDER — SODIUM CHLORIDE 0.9 % IV SOLN: INTRAVENOUS | Status: DC

## 2018-11-17 MED ORDER — SODIUM CHLORIDE 0.9% FLUSH
3.0000 mL | Freq: Two times a day (BID) | INTRAVENOUS | Status: DC
  Administered 2018-11-17 – 2018-11-23 (×6): 3 mL via INTRAVENOUS

## 2018-11-17 MED ORDER — MIDAZOLAM 50MG/50ML (1MG/ML) PREMIX INFUSION
0.5000 mg/h | INTRAVENOUS | Status: DC
  Administered 2018-11-17: 2 mg/h via INTRAVENOUS
  Administered 2018-11-18 – 2018-11-21 (×2): 0.5 mg/h via INTRAVENOUS
  Administered 2018-11-23: 18:00:00 2 mg/h via INTRAVENOUS
  Filled 2018-11-17 (×3): qty 50

## 2018-11-17 MED ORDER — PERFLUTREN LIPID MICROSPHERE
4.0000 mL | Freq: Once | INTRAVENOUS | Status: AC
  Administered 2018-11-17: 2.2 mg via INTRAVENOUS
  Filled 2018-11-17: qty 4

## 2018-11-17 MED ORDER — PERFLUTREN LIPID MICROSPHERE
INTRAVENOUS | Status: AC
  Administered 2018-11-17: 2.2 mg via INTRAVENOUS
  Filled 2018-11-17: qty 10

## 2018-11-17 MED ORDER — CHLORHEXIDINE GLUCONATE CLOTH 2 % EX PADS
6.0000 | MEDICATED_PAD | Freq: Every day | CUTANEOUS | Status: DC
  Administered 2018-11-17 – 2018-11-23 (×7): 6 via TOPICAL

## 2018-11-17 MED ORDER — HYDRALAZINE HCL 20 MG/ML IJ SOLN: 10.0000 mg | INTRAMUSCULAR | Status: AC | PRN

## 2018-11-17 MED ORDER — DEXTROSE 5 % IV SOLN
60.0000 mg/h | INTRAVENOUS | Status: DC
  Administered 2018-11-17 (×2): 60 mg/h via INTRAVENOUS
  Filled 2018-11-17: qty 9

## 2018-11-17 MED ORDER — MIDAZOLAM HCL 2 MG/2ML IJ SOLN
INTRAMUSCULAR | Status: AC
  Filled 2018-11-17: qty 4

## 2018-11-17 MED ORDER — SODIUM CHLORIDE 0.9 % IV SOLN
INTRAVENOUS | Status: AC | PRN
  Administered 2018-11-17: 10 mL/h via INTRAVENOUS
  Administered 2018-11-17: 15:00:00

## 2018-11-17 MED ORDER — SODIUM CHLORIDE 0.9 % IV SOLN
250.0000 mL | INTRAVENOUS | Status: DC | PRN
  Administered 2018-11-18: 14:00:00 250 mL via INTRAVENOUS

## 2018-11-17 MED ORDER — CHLORHEXIDINE GLUCONATE CLOTH 2 % EX PADS
6.0000 | MEDICATED_PAD | Freq: Every day | CUTANEOUS | Status: DC
  Administered 2018-11-18: 6 via TOPICAL

## 2018-11-17 MED ORDER — DOBUTAMINE IN D5W 4-5 MG/ML-% IV SOLN: 5.0000 ug/kg/min | INTRAVENOUS | Status: DC

## 2018-11-17 MED ORDER — NOREPINEPHRINE 16 MG/250ML-% IV SOLN
0.0000 ug/min | INTRAVENOUS | Status: DC
  Administered 2018-11-17: 15 ug/min via INTRAVENOUS
  Administered 2018-11-18: 35 ug/min via INTRAVENOUS
  Administered 2018-11-18: 40 ug/min via INTRAVENOUS
  Administered 2018-11-19: 5 ug/min via INTRAVENOUS
  Administered 2018-11-20: 10 ug/min via INTRAVENOUS
  Administered 2018-11-21: 16 ug/min via INTRAVENOUS
  Administered 2018-11-23: 18 ug/min via INTRAVENOUS
  Administered 2018-11-23: 54 ug/min via INTRAVENOUS
  Filled 2018-11-17 (×9): qty 250

## 2018-11-17 MED ORDER — FUROSEMIDE 10 MG/ML IJ SOLN
60.0000 mg | Freq: Once | INTRAMUSCULAR | Status: AC
  Administered 2018-11-17: 17:00:00 60 mg via INTRAVENOUS
  Filled 2018-11-17: qty 6

## 2018-11-17 MED ORDER — CLONIDINE HCL 0.1 MG PO TABS: 0.1000 mg | ORAL_TABLET | Freq: Every day | ORAL | Status: DC

## 2018-11-17 MED ORDER — ASPIRIN 81 MG PO CHEW
81.0000 mg | CHEWABLE_TABLET | Freq: Every day | ORAL | Status: DC
  Administered 2018-11-18 – 2018-11-22 (×5): 81 mg via ORAL
  Filled 2018-11-17 (×6): qty 1

## 2018-11-17 MED ORDER — DEXTROSE 5 % IV SOLN
60.0000 mg/h | INTRAVENOUS | Status: DC
  Filled 2018-11-17 (×4): qty 9

## 2018-11-17 MED ORDER — MILRINONE LACTATE IN DEXTROSE 20-5 MG/100ML-% IV SOLN
0.2500 ug/kg/min | INTRAVENOUS | Status: DC
  Administered 2018-11-17: 17:00:00 0.25 ug/kg/min via INTRAVENOUS
  Filled 2018-11-17: qty 100

## 2018-11-17 MED ORDER — HEPARIN SODIUM (PORCINE) 5000 UNIT/ML IJ SOLN
50000.0000 [IU] | INTRAVENOUS | Status: DC
  Administered 2018-11-17 – 2018-11-23 (×3): 50000 [IU]
  Filled 2018-11-17 (×7): qty 10

## 2018-11-17 MED ORDER — SODIUM CHLORIDE 0.9% FLUSH
10.0000 mL | Freq: Two times a day (BID) | INTRAVENOUS | Status: DC
  Administered 2018-11-17 – 2018-11-19 (×6): 10 mL
  Administered 2018-11-20: 10:00:00 20 mL
  Administered 2018-11-20 – 2018-11-21 (×2): 10 mL
  Administered 2018-11-21: 10:00:00 40 mL
  Administered 2018-11-22 – 2018-11-23 (×3): 10 mL

## 2018-11-17 MED ORDER — AMIODARONE IV BOLUS ONLY 150 MG/100ML: 150.0000 mg | Freq: Once | INTRAVENOUS | Status: DC

## 2018-11-17 MED ORDER — ACETAMINOPHEN 325 MG PO TABS: 650.0000 mg | ORAL_TABLET | ORAL | Status: DC | PRN

## 2018-11-17 MED ORDER — PANTOPRAZOLE SODIUM 40 MG IV SOLR
40.0000 mg | Freq: Two times a day (BID) | INTRAVENOUS | Status: DC
  Administered 2018-11-17 – 2018-11-23 (×12): 40 mg via INTRAVENOUS
  Filled 2018-11-17 (×12): qty 40

## 2018-11-17 MED ORDER — NOREPINEPHRINE 4 MG/250ML-% IV SOLN
INTRAVENOUS | Status: AC
  Filled 2018-11-17: qty 250

## 2018-11-17 MED ORDER — ASPIRIN 81 MG PO CHEW: 81.0000 mg | CHEWABLE_TABLET | ORAL | Status: DC

## 2018-11-17 MED ORDER — ORAL CARE MOUTH RINSE
15.0000 mL | OROMUCOSAL | Status: DC
  Administered 2018-11-17 – 2018-11-23 (×60): 15 mL via OROMUCOSAL

## 2018-11-17 MED ORDER — LABETALOL HCL 5 MG/ML IV SOLN: 10.0000 mg | INTRAVENOUS | Status: AC | PRN

## 2018-11-17 MED ORDER — DOBUTAMINE IN D5W 4-5 MG/ML-% IV SOLN
5.0000 ug/kg/min | INTRAVENOUS | Status: DC
  Administered 2018-11-17: 5 ug/kg/min via INTRAVENOUS

## 2018-11-17 MED ORDER — HEPARIN (PORCINE) IN NACL 1000-0.9 UT/500ML-% IV SOLN
INTRAVENOUS | Status: DC | PRN
  Administered 2018-11-17 (×2): 500 mL

## 2018-11-17 MED ORDER — LIDOCAINE HCL (PF) 1 % IJ SOLN
INTRAMUSCULAR | Status: DC | PRN
  Administered 2018-11-17: 20 mL

## 2018-11-17 MED ORDER — HEPARIN SODIUM (PORCINE) 1000 UNIT/ML IJ SOLN
INTRAMUSCULAR | Status: DC | PRN
  Administered 2018-11-17: 3000 [IU] via INTRAVENOUS
  Administered 2018-11-17: 7500 [IU] via INTRAVENOUS
  Administered 2018-11-17: 5000 [IU] via INTRAVENOUS
  Administered 2018-11-17: 3000 [IU] via INTRAVENOUS

## 2018-11-17 MED ORDER — MIDAZOLAM HCL 2 MG/2ML IJ SOLN
1.0000 mg | INTRAMUSCULAR | Status: DC | PRN
  Filled 2018-11-17: qty 2

## 2018-11-17 MED ORDER — DOBUTAMINE IN D5W 4-5 MG/ML-% IV SOLN
INTRAVENOUS | Status: AC
  Filled 2018-11-17: qty 250

## 2018-11-17 MED ORDER — HEPARIN (PORCINE) IN NACL 1000-0.9 UT/500ML-% IV SOLN
INTRAVENOUS | Status: AC
  Filled 2018-11-17: qty 1500

## 2018-11-17 SURGICAL SUPPLY — 14 items
CASSETTE CONTROL PURGE IMPELLA (MISCELLANEOUS) ×2 IMPLANT
CATH INFINITI MULTIPACK ST 5F (CATHETERS) ×2 IMPLANT
CATH SWAN GANZ VIP 7.5F (CATHETERS) ×2 IMPLANT
ELECT DEFIB PAD ADLT CADENCE (PAD) ×2 IMPLANT
HOVERMATT SINGLE USE (MISCELLANEOUS) ×2 IMPLANT
KIT HEART LEFT (KITS) ×2 IMPLANT
PACK CARDIAC CATHETERIZATION (CUSTOM PROCEDURE TRAY) ×2 IMPLANT
SET IMPELLA CP PUMP (CATHETERS) ×2 IMPLANT
SHEATH PINNACLE 5F 10CM (SHEATH) ×4 IMPLANT
SHEATH PINNACLE 8F 10CM (SHEATH) ×2 IMPLANT
SLEEVE REPOSITIONING LENGTH 30 (MISCELLANEOUS) ×2 IMPLANT
TRANSDUCER W/STOPCOCK (MISCELLANEOUS) ×2 IMPLANT
TUBING CIL FLEX 10 FLL-RA (TUBING) ×2 IMPLANT
WIRE EMERALD 3MM-J .035X150CM (WIRE) ×2 IMPLANT

## 2018-11-17 NOTE — Progress Notes (Signed)
ANTICOAGULATION CONSULT NOTE - Follow Up Consult  Pharmacy Consult for Heparin Indication: chest pain/ACS > now for Impella  Allergies  Allergen Reactions  . Penicillins     Childhood allergy- unaware of reaction    Patient Measurements: Height: 6\' 2"  (188 cm) Weight: 184 lb 4.9 oz (83.6 kg) IBW/kg (Calculated) : 82.2 Heparin Dosing Weight: 83.2 kg  Vital Signs: Temp: 100.2 F (37.9 C) (05/24 2251) Temp Source: Oral (05/24 2000) BP: 94/72 (05/24 2200) Pulse Rate: 140 (05/24 2251)  Labs: Recent Labs    10/26/2018 2146 11/16/18 0223 11/16/18 0718 11/16/18 1417 12-13-18 0453 12-13-18 0620 Dec 13, 2018 1618 2018-12-13 2222  HGB 12.4*  --   --   --  12.5* 11.9* 10.2* 10.5*  HCT 39.0  --   --   --  39.0 35.0* 30.0* 31.6*  PLT 232  --   --   --  248  --   --  241  APTT 28  --   --   --   --   --   --   --   LABPROT 14.0  --   --   --   --   --   --   --   INR 1.1  --   --   --   --   --   --   --   HEPARINUNFRC  --   --  0.53 0.61 0.68  --   --   --   CREATININE 1.58* 2.38*  --   --  2.56*  --   --   --   TROPONINI 0.34* 0.98* 3.29* 6.46* 10.33*  --   --   --     Estimated Creatinine Clearance: 30.8 mL/min (A) (by C-G formula based on SCr of 2.56 mg/dL (H)).  Assessment: 73 yo male on IV heparin for NSTEMI.  Heparin level remains at goal, no bleeding or complications noted, CBC stable.  Pt is s/p cath lab this afternoon where Impella CP was placed.  Heparin running in purge solution now - purge flow rate at 17.1 mL/hr (855 units/hr). Last ACT 142 - okay with MD to start systemic heparin at this time. Based on initial rate of 8-10 units/kg/hr, will start systemic rate at 100 units/hr and follow nursing protocol for ACT monitoring.  Goal of Therapy:  ACT 160-180 Heparin level 0.3-0.7 units/ml Monitor platelets by anticoagulation protocol: Yes   Plan:  Heparin in purge solution for now Start systemic heparin at rate of 100 units/hr  F/u ACTs per Impella protocol. Daily  CBC F/u plans for cardiac intervention.  Sherron Monday, PharmD, BCCCP Clinical Pharmacist  Pager: [phone] Phone: [phone]  2018-12-13 10:59 PM

## 2018-11-17 NOTE — Progress Notes (Signed)
Asked by RN and Cardiologist to help expedite pt's transfer to 2H.  Pt transferred to 2H14 with RN and RT.

## 2018-11-17 NOTE — Progress Notes (Signed)
Dr.Turner came and saw patient, request orders for Amio 150 bolus over , and continue rate at 60mg , stated to keep going up on Levo to keep Map 65<, continue to watch impella site, start heparin but give no bolus. Contacted pharmacy and Selena Batten put in orders to start at 100u/hr. Will continue to monitor pt.

## 2018-11-17 NOTE — Progress Notes (Addendum)
NAME:  Eric Spencer, MRN:  [id number], DOB:  25-Apr-1946, LOS: 1 ADMISSION DATE:  11/03/2018, CONSULTATION DATE:  11/16/18 REFERRING MD:  Dr. Delton See, CHIEF COMPLAINT:  SOB   Brief History   73 y/o M, former smoker (quit 3 years ago), admitted 5/22 from [facility] with shortness of breath. Work up concerning for possible AECOPD, NSTEMI, AKI.  Transferred to ICU 5/24 with SOB and concern for cardiogenic shock.   Past Medical History  COPD  HTN  Allergies  Arthritis   Significant Hospital Events   5/23 Admit   Consults:  PCCM  Procedures:    Significant Diagnostic Tests:    Micro Data:  COVID 5/22 >> negative  BCx2 5/23 >>  RVP 5/24 >>   Antimicrobials:  Doxycycline 5/23 >> 5/24  Interim history/subjective:  Worsening respiratory status on BiPAP Now on Levophed, dobutamine Heart rate and rapid atrial fibrillation Urine output has dropped off with no response to Lasix  Objective   Blood pressure (!) 86/67, pulse (!) 116, temperature 98.6 F (37 C), temperature source Axillary, resp. rate (!) 28, height 6\' 2"  (1.88 m), weight 83.6 kg, SpO2 99 %.    FiO2 (%):  [30 %-40 %] 30 %   Intake/Output Summary (Last 24 hours) at 11/19/2018 0849 Last data filed at 10/29/2018 0600 Gross per 24 hour  Intake 324.86 ml  Output 180 ml  Net 144.86 ml   Filed Weights   11/24/2018 2125 11/16/18 0619 10/28/2018 0500  Weight: 90.7 kg 83.2 kg 83.6 kg    Examination: Gen:      Moderate respiratory distress HEENT:  EOMI, sclera anicteric Neck:     No masses; no thyromegaly, ETT Lungs:    Clear to auscultation bilaterally; normal respiratory effort CV:         Regular rate and rhythm; no murmurs Abd:      + bowel sounds; soft, non-tender; no palpable masses, no distension Ext:    No edema; adequate peripheral perfusion Skin:      Warm and dry; no rash Neuro: No focal deficits  Resolved Hospital Problem list      Assessment & Plan:  73 year old with acute respiratory failure, anuric  renal failure in setting of non-ST elevation MI, cardiogenic shock  Non-ST elevation MI, atrial fibrillation Cardiogenic shock Discussed with cardiology at bedside He will need catheterization, Impella placement Amiodarone for atrial fibrillation Continue heparin drip, dobutamine  Acute respiratory failure, possible exacerbation of COPD -possible environmental trigger / allergies, GERD  -COVID negative  P: Intubate now We will need central line placement Continue Brovana, Pulmicort Solu-Medrol  Anuric renal failiure P: Follow urine output Will likely need CVVH  GERD P: BID Pepcid   Goals of care P: I do long discussion with patient at bedside.  He was initially undecided but agreed for intubation and central line placement  Best practice:  Diet: NPO Pain/Anxiety/Delirium protocol (if indicated): n/a VAP protocol (if indicated): n/a DVT prophylaxis: heparin gtt  GI prophylaxis: pepcid BID  Glucose control: per primary  Mobility: as tolerated  Code Status: Full Code  Family Communication: Patient updated on plan of care 5/24  Disposition: Per Cardiology.   Labs   CBC: Recent Labs  Lab 10/27/2018 2146 10/28/2018 0453 10/28/2018 0620  WBC 10.0 27.1*  --   NEUTROABS 8.9*  --   --   HGB 12.4* 12.5* 11.9*  HCT 39.0 39.0 35.0*  MCV 94.0 93.3  --   PLT 232 248  --  Basic Metabolic Panel: Recent Labs  Lab 11/12/2018 2146 11/16/18 0223 11/02/2018 0453 11/21/2018 0620  NA 136 138 139 137  K 4.1 4.5 5.2* 4.9  CL 104 104 105  --   CO2 20* 15* 17*  --   GLUCOSE 190* 238* 244*  --   BUN 37* 37* 62*  --   CREATININE 1.58* 2.38* 2.56*  --   CALCIUM 8.9 9.3 9.3  --   MG  --  2.3  --   --    GFR: Estimated Creatinine Clearance: 30.8 mL/min (A) (by C-G formula based on SCr of 2.56 mg/dL (H)). Recent Labs  Lab 11/16/2018 2146 11/16/18 0234 11/16/18 0531 11/04/2018 0453  PROCALCITON  --   --   --  0.42  WBC 10.0  --   --  27.1*  LATICACIDVEN  --  8.2* 9.0* 3.7*     Liver Function Tests: Recent Labs  Lab 11/16/18 0223  AST 27  ALT 19  ALKPHOS 58  BILITOT 0.6  PROT 7.1  ALBUMIN 3.6   No results for input(s): LIPASE, AMYLASE in the last 168 hours. No results for input(s): AMMONIA in the last 168 hours.  ABG    Component Value Date/Time   PHART 7.341 (L) 11/07/2018 0620   PCO2ART 34.7 10/25/2018 0620   PO2ART 77.0 (L) 11/24/2018 0620   HCO3 18.8 (L) 11/05/2018 0620   TCO2 20 (L) 11/21/2018 0620   ACIDBASEDEF 6.0 (H) 11/24/2018 0620   O2SAT 95.0 11/12/2018 0620     Coagulation Profile: Recent Labs  Lab 10/25/2018 2146  INR 1.1    Cardiac Enzymes: Recent Labs  Lab 11/21/2018 2146 11/16/18 0223 11/16/18 0718 11/16/18 1417 11/07/2018 0453  TROPONINI 0.34* 0.98* 3.29* 6.46* 10.33*    HbA1C: Hgb A1c MFr Bld  Date/Time Value Ref Range Status  11/16/2018 02:23 AM 5.6 4.8 - 5.6 % Final    Comment:    (NOTE) Pre diabetes:          5.7%-6.4% Diabetes:              >6.4% Glycemic control for   <7.0% adults with diabetes   01/13/2016 03:44 PM 5.3 <5.7 % Final    Comment:      For the purpose of screening for the presence of diabetes:   <5.7%       Consistent with the absence of diabetes 5.7-6.4 %   Consistent with increased risk for diabetes (prediabetes) >=6.5 %     Consistent with diabetes   This assay result is consistent with a decreased risk of diabetes.   Currently, no consensus exists regarding use of hemoglobin A1c for diagnosis of diabetes in children.   According to American Diabetes Association (ADA) guidelines, hemoglobin A1c <7.0% represents optimal control in non-pregnant diabetic patients. Different metrics may apply to specific patient populations. Standards of Medical Care in Diabetes (ADA).       CBG: No results for input(s): GLUCAP in the last 168 hours.   The patient is critically ill with multiple organ system failure and requires high complexity decision making for assessment and support, frequent  evaluation and titration of therapies, advanced monitoring, review of radiographic studies and interpretation of complex data.   Critical Care Time devoted to patient care services, exclusive of separately billable procedures, described in this note is 35 minutes.   Chilton GreathousePraveen Charlize Hathaway MD [facility] Pager [phone] If no answer call [phone] 11/09/2018, 8:49 AM

## 2018-11-17 NOTE — Progress Notes (Addendum)
Dr. Shirlee Latch called, gave update on patient, inform him of patient Afib RVR 140's and Dr. Mayford Knife request to bolus patient with Amio 150 over , increase rate back to 60mg , continue to watch snoozing from site, gave CO/CI update.

## 2018-11-17 NOTE — Progress Notes (Signed)
ANTICOAGULATION CONSULT NOTE - Follow Up Consult  Pharmacy Consult for Heparin Indication: chest pain/ACS  Allergies  Allergen Reactions  . Penicillins     Childhood allergy- unaware of reaction    Patient Measurements: Height: 6\' 2"  (188 cm) Weight: 184 lb 4.9 oz (83.6 kg) IBW/kg (Calculated) : 82.2 Heparin Dosing Weight: 83.2 kg  Vital Signs: Temp: 98.6 F (37 C) (05/24 0424) Temp Source: Axillary (05/24 0424) BP: 86/67 (05/24 0712) Pulse Rate: 116 (05/24 0712)  Labs: Recent Labs    11/12/2018 2146 11/16/18 0223 11/16/18 0718 11/16/18 1417 11/20/2018 0453 11/22/2018 0620  HGB 12.4*  --   --   --  12.5* 11.9*  HCT 39.0  --   --   --  39.0 35.0*  PLT 232  --   --   --  248  --   APTT 28  --   --   --   --   --   LABPROT 14.0  --   --   --   --   --   INR 1.1  --   --   --   --   --   HEPARINUNFRC  --   --  0.53 0.61 0.68  --   CREATININE 1.58* 2.38*  --   --  2.56*  --   TROPONINI 0.34* 0.98* 3.29* 6.46* 10.33*  --     Estimated Creatinine Clearance: 30.8 mL/min (A) (by C-G formula based on SCr of 2.56 mg/dL (H)).  Assessment: 73 yo male on IV heparin for NSTEMI.  Heparin level remains at goal, no bleeding or complications noted, CBC stable.  Goal of Therapy:  Heparin level 0.3-0.7 units/ml Monitor platelets by anticoagulation protocol: Yes   Plan:  Continue heparin drip at 1100 units/hr Daily HL and CBC F/u plans for cardiac workup.  Jenetta Downer, [facility] Clinical Pharmacist Phone [phone]  10/26/2018 8:13 AM

## 2018-11-17 NOTE — Progress Notes (Signed)
  Echocardiogram 2D Echocardiogram has been performed.  Delcie Roch 11/20/2018, 3:09 PM

## 2018-11-17 NOTE — Plan of Care (Signed)
Sent link twice for video chat. Called phone number x 2 with no answer. Wife not responding. Will try again later

## 2018-11-17 NOTE — Progress Notes (Signed)
ANTICOAGULATION CONSULT NOTE - Follow Up Consult  Pharmacy Consult for Heparin Indication: chest pain/ACS > now for Impella  Allergies  Allergen Reactions  . Penicillins     Childhood allergy- unaware of reaction    Patient Measurements: Height: 6\' 2"  (188 cm) Weight: 184 lb 4.9 oz (83.6 kg) IBW/kg (Calculated) : 82.2 Heparin Dosing Weight: 83.2 kg  Vital Signs: Temp: 98.6 F (37 C) (05/24 0424) Temp Source: Axillary (05/24 0424) BP: 87/57 (05/24 1400) Pulse Rate: 66 (05/24 1400)  Labs: Recent Labs    2018-12-04 2146 11/16/18 0223 11/16/18 0718 11/16/18 1417 11/20/2018 0453 11/15/2018 0620  HGB 12.4*  --   --   --  12.5* 11.9*  HCT 39.0  --   --   --  39.0 35.0*  PLT 232  --   --   --  248  --   APTT 28  --   --   --   --   --   LABPROT 14.0  --   --   --   --   --   INR 1.1  --   --   --   --   --   HEPARINUNFRC  --   --  0.53 0.61 0.68  --   CREATININE 1.58* 2.38*  --   --  2.56*  --   TROPONINI 0.34* 0.98* 3.29* 6.46* 10.33*  --     Estimated Creatinine Clearance: 30.8 mL/min (A) (by C-G formula based on SCr of 2.56 mg/dL (H)).  Assessment: 73 yo male on IV heparin for NSTEMI.  Heparin level remains at goal, no bleeding or complications noted, CBC stable.  Pt is s/p cath lab this afternoon where Impella CP was placed.  Heparin gtt turned off during cath procedure.  Heparin running in purge solution now.  Goal of Therapy:  Heparin level 0.3-0.7 units/ml Monitor platelets by anticoagulation protocol: Yes   Plan:  Heparin in purge solution for now. F/u ACTs and initiate systemic heparin per Impella protocol. Daily CBC F/u plans for cardiac intervention.  Jenetta Downer, [facility] Clinical Pharmacist Phone [phone]  10/30/2018 3:01 PM

## 2018-11-17 NOTE — Progress Notes (Signed)
eLink Physician-Brief Progress Note Patient Name: Eric Spencer DOB: [birth year] MRN: [id number]   Date of Service  December 09, 2018  HPI/Events of Note  Cardiologist discussed earlier about shifting [name] to ICU. For AHRF from COPD exacerbation/Low EF CHF/impending cardiogenic shock- elevated LA and NSTEMI.  Video: On BiPAP, follows commands. Getting TTE. HR 126 to 130's On Heparin gtt. MAP 82. sats 92%.   Data: Reviewed. Notes seen.  ABG Metabbolic and resp acidosis. PH 7.36. LA 3.7, improving. WBC 27K. Hg 12.5   eICU Interventions  - continue care as per cards. - to go on dobutamine first, later if BP low to use dopamine.  - on prophylaxis - on COPD RX.      Intervention Category Major Interventions: Respiratory failure - evaluation and management;Acute renal failure - evaluation and management Minor Interventions: Communication with other healthcare providers and/or family Evaluation Type: New Patient Evaluation  Ranee Gosselin 2018-12-09, 5:37 AM

## 2018-11-17 NOTE — Progress Notes (Signed)
Orthopedic Tech Progress Note Patient Details:  Eric Spencer 1945/08/24 [id number]  Ortho Devices Type of Ortho Device: Knee Immobilizer Ortho Device/Splint Location: LRE Ortho Device/Splint Interventions: Adjustment, Application, Ordered   Post Interventions Patient Tolerated: Well Instructions Provided: Care of device, Adjustment of device   Eric Spencer 11/12/2018, 3:55 PM

## 2018-11-17 NOTE — Interval H&P Note (Signed)
Cath Lab Visit (complete for each Cath Lab visit)  Clinical Evaluation Leading to the Procedure:   ACS: Yes.    Non-ACS:    Anginal Classification: CCS II  Anti-ischemic medical therapy: No Therapy  Non-Invasive Test Results: No non-invasive testing performed  Prior CABG: No previous CABG      History and Physical Interval Note:  12/07/18 11:03 AM  Eric Spencer  has presented today for surgery, with the diagnosis of STEMI.  The various methods of treatment have been discussed with the patient and family. After consideration of risks, benefits and other options for treatment, the patient has consented to  Procedure(s): RIGHT/LEFT HEART CATH AND CORONARY ANGIOGRAPHY (N/A) as a surgical intervention.  The patient's history has been reviewed, patient examined, no change in status, stable for surgery.  I have reviewed the patient's chart and labs.  Questions were answered to the patient's satisfaction.     Nanetta Batty

## 2018-11-17 NOTE — Procedures (Signed)
Intubation Procedure Note Vadhir Mcnay [id number] 1945-09-11  Procedure: Intubation Indications: Respiratory insufficiency  Procedure Details Consent: Risks of procedure as well as the alternatives and risks of each were explained to the (patient/caregiver).  Consent for procedure obtained. Time Out: Verified patient identification, verified procedure, site/side was marked, verified correct patient position, special equipment/implants available, medications/allergies/relevent history reviewed, required imaging and test results available.  Performed  Maximum sterile technique was used including cap, gloves, gown, hand hygiene and mask.  MAC Blade 3, 1 attempt    Evaluation Hemodynamic Status: BP stable throughout; O2 sats: stable throughout Patient's Current Condition: stable Complications: No apparent complications Patient did tolerate procedure well. Chest X-ray ordered to verify placement.  CXR: pending.   Kirk Sampley 11/01/2018

## 2018-11-17 NOTE — Procedures (Signed)
Arterial Catheter Insertion Procedure Note Tristian Rairigh [id number] 1945/07/10  Procedure: Insertion of Arterial Catheter  Indications: Blood pressure monitoring and Frequent blood sampling  Procedure Details Consent: Risks of procedure as well as the alternatives and risks of each were explained to the (patient/caregiver).  Consent for procedure obtained. Time Out: Verified patient identification, verified procedure, site/side was marked, verified correct patient position, special equipment/implants available, medications/allergies/relevent history reviewed, required imaging and test results available.  Performed  Maximum sterile technique was used including antiseptics, cap, gloves, gown, hand hygiene, mask and sheet. Skin prep: Chlorhexidine; local anesthetic administered 20 gauge catheter was inserted into right radial artery using the Seldinger technique. ULTRASOUND GUIDANCE USED: NO Evaluation Blood flow good; BP tracing good. Complications: No apparent complications.   Hiram Comber 10/25/2018

## 2018-11-17 NOTE — Progress Notes (Signed)
Dr Clarnce Flock up to see patient and will transfer to 2 Heart for closer monitoring. Report called to Zollie Scale RN, Mindy RN Rapid Response called with update and came to assist in transfer via bed with RT, Bipap and personal items sent.

## 2018-11-17 NOTE — Progress Notes (Addendum)
"  I feel better than I did a while ago". No angina "since I got here". Unable to lie flat.  Cuff BP as low as SBP 70, HR 150s, appears to be atrial fibrillation. Art line 100/71. Switching from dobutamine to norepi. SBP 90 in L arm, 98 in R arm. O2 sat 95% BiPAP Distant breath sounds, but no rales or wheezes. A & O, looks comfortable. Warm extremities. Oligoanuric despite diuretics.  ABG with mild metabolic acidosis, incomplete resp compensation, rapidly worsening renal function. Recheck ECG. Prefer norepi over vasodilating agents. Start amio if in AFib.  Needs mechanical support. Prefer Impella over IABP due to irregular and rapid rhythm.  D/W Interventional Cardiology, Dr. Berry, CHF -Dr. McLean and Pulmonary Critical Care, Dr. Mannam.  CRITICAL CARE Performed by: Milind Raether   Total critical care time: 45 minutes  Critical care time was exclusive of separately billable procedures and treating other patients.  Critical care was necessary to treat or prevent imminent or life-threatening deterioration.  Critical care was time spent personally by me on the following activities: development of treatment plan with patient and/or surrogate as well as nursing, discussions with consultants, evaluation of patient's response to treatment, examination of patient, obtaining history from patient or surrogate, ordering and performing treatments and interventions, ordering and review of laboratory studies, ordering and review of radiographic studies, pulse oximetry and re-evaluation of patient's condition.   Briah Nary, MD, FACC [facility] [phone] office [phone] pager    

## 2018-11-17 NOTE — Progress Notes (Addendum)
I was called about [name]. He was admitted for NSTEMI, SOB, history of CP prior to admission, and lactic acidosis. AKI. Trop was 1. Cath was deferred, COPD management was initiated. Heparin was started  Throughout the day: Trop increased to 6. Lactate was not trended throughout the day but was around 9 on repeat check. He was initiated on BIPAP for SOB. Throughout the day he did not tolerate coming off bipap. ABG was not grossly abnormal. He mad no urine in the evening shift.   BP 80-100 syst throughout night. He only complains of SOB once off bipap but on bipap he was OK. HR 100-130.  Bedside echo done myself showed LVEF of ~20%. RV normal. IVC dilated, non collapsible. CXR with pulm edema but he has known COPD so much of it could be interstitial marking due to fibrosis.  ECG: Sinus tach with diffuse ST depression. Similar to admission ECG.   My concern here is that patient is in cardiogenic shock. I moved the patient to the ICU. I discussed the case with Dr Allyson Sabal the on call attending for cath for consideration of IABP +/- cors. Decision was made to defer at this time and attempt inotropic support. This will be revisited if things change.   Plan:  - official TTE - Abg and labs including lactate - dobutamine 5. If not tolerated and creatinine is not rising can consider milrinone. If BP to drop will use Dopa. - Lasix 40iv.  - A-line - Appriciate support by Pulm team to assist with possible need for intubation    Macario Golds, MD Cardiology

## 2018-11-17 NOTE — Procedures (Signed)
Central Venous Catheter Insertion Procedure Note Eric Spencer [id number] 1945/07/18  Procedure: Insertion of Central Venous Catheter Indications: Drug and/or fluid administration  Procedure Details Consent: Risks of procedure as well as the alternatives and risks of each were explained to the (patient/caregiver).  Consent for procedure obtained. Time Out: Verified patient identification, verified procedure, site/side was marked, verified correct patient position, special equipment/implants available, medications/allergies/relevent history reviewed, required imaging and test results available.  Performed  Maximum sterile technique was used including antiseptics, cap, gloves, gown, hand hygiene, mask and sheet. Skin prep: Chlorhexidine; local anesthetic administered A antimicrobial bonded/coated triple lumen catheter was placed in the left internal jugular vein using the Seldinger technique.      Evaluation Blood flow good Complications: No apparent complications Patient did tolerate procedure well. Chest X-ray ordered to verify placement.  CXR: pending.  Eric Spencer 11/04/2018, 10:15 AM

## 2018-11-17 NOTE — Progress Notes (Signed)
Patient has had a very low outpt for my shift and bladder scan only showed 99-110cc of urine. Patient not uncomfortable at this time, will continue to monitor and call MD

## 2018-11-17 NOTE — Progress Notes (Signed)
CRITICAL VALUE ALERT  Critical Value:  LA 3.7  Date & Time Notied:  Today @0455   Provider Notified: Dr. Clarnce Flock  Orders Received/Actions taken: MD at bedside, verbally told him the value & he wants to trend LA's

## 2018-11-17 NOTE — Consult Note (Signed)
NAME:  Eric Spencer, MRN:  [id number], DOB:  17-Oct-1945, LOS: 1 ADMISSION DATE:  10/31/2018, CONSULTATION DATE:  11/16/18 REFERRING MD:  Dr. Delton See, CHIEF COMPLAINT:  SOB   Brief History   73 y/o M, former smoker (quit 3 years ago), admitted 5/22 from [facility] with shortness of breath. Work up concerning for possible AECOPD, NSTEMI, AKI.  Transferred to ICU 5/24 with SOB and concern for cardiogenic shock.   Past Medical History  COPD  HTN  Allergies  Arthritis   Significant Hospital Events   5/23 Admit   Consults:  PCCM  Procedures:    Significant Diagnostic Tests:    Micro Data:  COVID 5/22 >> negative  BCx2 5/23 >>  RVP 5/24 >>   Antimicrobials:  Doxycycline 5/23 >> 5/24  Interim history/subjective:  Pt reports he was sleeping and woke due to nursing being concerned for hypotension.  Transferred to ICU.   Objective   Blood pressure 93/76, pulse (!) 136, temperature 98.2 F (36.8 C), temperature source Axillary, resp. rate (!) 30, height 6\' 2"  (1.88 m), weight 83.2 kg, SpO2 98 %.    FiO2 (%):  [40 %] 40 %   Intake/Output Summary (Last 24 hours) at 11/22/2018 0440 Last data filed at 11/02/2018 0000 Gross per 24 hour  Intake 905.83 ml  Output 175 ml  Net 730.83 ml   Filed Weights   11/03/2018 2125 11/16/18 0619  Weight: 90.7 kg 83.2 kg    Examination: General: elderly male lying in bed on BiPAP HEENT: MM pink/dry, BiPAP mask in place  Neuro: AAOx4, speech clear, MAE  CV: s1s2 rrr, no m/r/g PULM: increased accessory muscle use, distant breath sounds, faint wheeze  GI: soft, non-tender, bsx4 active  Extremities: warm/dry, no edema  Skin: no rashes or lesions  Resolved Hospital Problem list      Assessment & Plan:   Acute Exacerbation of COPD  -possible environmental trigger / allergies, GERD  -COVID negative  P: BiPAP for now, reassess in 30 minutes for decision regarding intubation > ABG reviewed  Wean FiO2 for sats 88-95% Continue Pulmicort +  Brovana Solumedrol 40 mg IV Q12  Claritin  Assess RVP   NSTEMI  Tachycardia  R/O Cardiogenic Shock  P: Per Cardiology  Dobutamine gtt  Heparin gtt per pharmacy  Follow up lactate, troponin  Now ECHO  AKI -suspect in setting of NSAID use, diuretics Elevated Lactic Acid  -initially suspected in setting of increased WOB, slow clearance with AKI P: Trend BMP / urinary output Replace electrolytes as indicated Avoid nephrotoxic agents, ensure adequate renal perfusion  GERD P: BID Pepcid    Best practice:  Diet: As tolerated  Pain/Anxiety/Delirium protocol (if indicated): n/a VAP protocol (if indicated): n/a DVT prophylaxis: heparin gtt  GI prophylaxis: pepcid BID  Glucose control: per primary  Mobility: as tolerated  Code Status: Full Code  Family Communication: Patient updated on plan of care 5/24  Disposition: Per Cardiology.   Labs   CBC: Recent Labs  Lab 11/03/2018 2146  WBC 10.0  NEUTROABS 8.9*  HGB 12.4*  HCT 39.0  MCV 94.0  PLT 232    Basic Metabolic Panel: Recent Labs  Lab 11/03/2018 2146 11/16/18 0223  NA 136 138  K 4.1 4.5  CL 104 104  CO2 20* 15*  GLUCOSE 190* 238*  BUN 37* 37*  CREATININE 1.58* 2.38*  CALCIUM 8.9 9.3  MG  --  2.3   GFR: Estimated Creatinine Clearance: 33.1 mL/min (A) (by C-G formula  based on SCr of 2.38 mg/dL (H)). Recent Labs  Lab 11/19/2018 2146 11/16/18 0234 11/16/18 0531  WBC 10.0  --   --   LATICACIDVEN  --  8.2* 9.0*    Liver Function Tests: Recent Labs  Lab 11/16/18 0223  AST 27  ALT 19  ALKPHOS 58  BILITOT 0.6  PROT 7.1  ALBUMIN 3.6   No results for input(s): LIPASE, AMYLASE in the last 168 hours. No results for input(s): AMMONIA in the last 168 hours.  ABG    Component Value Date/Time   PHART 7.363 11/11/2018 0400   PCO2ART 35.3 11/01/2018 0400   PO2ART 85.9 11/03/2018 0400   HCO3 19.6 (L) 11/16/2018 0400   ACIDBASEDEF 4.8 (H) 11/09/2018 0400   O2SAT 95.8 11/14/2018 0400     Coagulation  Profile: Recent Labs  Lab 11/09/2018 2146  INR 1.1    Cardiac Enzymes: Recent Labs  Lab 11/09/2018 2146 11/16/18 0223 11/16/18 0718 11/16/18 1417  TROPONINI 0.34* 0.98* 3.29* 6.46*    HbA1C: Hgb A1c MFr Bld  Date/Time Value Ref Range Status  11/16/2018 02:23 AM 5.6 4.8 - 5.6 % Final    Comment:    (NOTE) Pre diabetes:          5.7%-6.4% Diabetes:              >6.4% Glycemic control for   <7.0% adults with diabetes   01/13/2016 03:44 PM 5.3 <5.7 % Final    Comment:      For the purpose of screening for the presence of diabetes:   <5.7%       Consistent with the absence of diabetes 5.7-6.4 %   Consistent with increased risk for diabetes (prediabetes) >=6.5 %     Consistent with diabetes   This assay result is consistent with a decreased risk of diabetes.   Currently, no consensus exists regarding use of hemoglobin A1c for diagnosis of diabetes in children.   According to American Diabetes Association (ADA) guidelines, hemoglobin A1c <7.0% represents optimal control in non-pregnant diabetic patients. Different metrics may apply to specific patient populations. Standards of Medical Care in Diabetes (ADA).       CBG: No results for input(s): GLUCAP in the last 168 hours.   Critical care time: 40 minutes   Canary BrimBrandi Riely Oetken, NP-C [facility] Pgr: [phone] or if no answer [phone] 11/17/2018, 4:40 AM

## 2018-11-17 NOTE — H&P (View-Only) (Signed)
"  I feel better than I did a while ago". No angina "since I got here". Unable to lie flat.  Cuff BP as low as SBP 70, HR 150s, appears to be atrial fibrillation. Art line 100/71. Switching from dobutamine to norepi. SBP 90 in L arm, 98 in R arm. O2 sat 95% BiPAP Distant breath sounds, but no rales or wheezes. A & O, looks comfortable. Warm extremities. Oligoanuric despite diuretics.  ABG with mild metabolic acidosis, incomplete resp compensation, rapidly worsening renal function. Recheck ECG. Prefer norepi over vasodilating agents. Start amio if in AFib.  Needs mechanical support. Prefer Impella over IABP due to irregular and rapid rhythm.  D/W Interventional Cardiology, Dr. Allyson Sabal, CHF -Dr. Shirlee Latch and Pulmonary Critical Care, Dr. Isaiah Serge.  CRITICAL CARE Performed by: Thurmon Fair   Total critical care time: 45 minutes  Critical care time was exclusive of separately billable procedures and treating other patients.  Critical care was necessary to treat or prevent imminent or life-threatening deterioration.  Critical care was time spent personally by me on the following activities: development of treatment plan with patient and/or surrogate as well as nursing, discussions with consultants, evaluation of patient's response to treatment, examination of patient, obtaining history from patient or surrogate, ordering and performing treatments and interventions, ordering and review of laboratory studies, ordering and review of radiographic studies, pulse oximetry and re-evaluation of patient's condition.   Thurmon Fair, MD, [facility] [phone] office [phone] pager

## 2018-11-17 NOTE — Progress Notes (Signed)
CRITICAL VALUE ALERT  Critical Value:  Lactic Acid 3.6  Date & Time Notied:  11/16/2018 1700  Provider Notified: Dr. Chilton Greathouse   Orders Received/Actions taken: No new orders at this time. Will continue to monitor.

## 2018-11-17 NOTE — Consult Note (Addendum)
Advanced Heart Failure Team Consult Note   Primary Physician: Patient, No Pcp Per PCP-Cardiologist:  No primary care provider on file.  Reason for Consultation: Cardiogenic shock  HPI:    Eric Spencer is seen today for evaluation of cardiogenic shock at the request of Dr. Sallyanne Kuster.   73 y.o. with history of severe COPD and hypertension came to the ER initially with increased dyspnea and chest pain, found to be in shock.   TnI initially 0.34, AKI also noted with creatinine up to 2.56.  He was thought to have a COPD exacerbation.  Initially got IVF bolus and BP stabilized.  However, decompensated last night with rise in troponin to 10.33.  Bedside echo was done, EF thought to be about 20%.  Concern overnight for cardiogenic shock so dobutamine started by fellow.  This morning, patient went into atrial fibrillation with RVR with worsening shock.  Dobutamine started and norepinephrine begun.  Dr. Sallyanne Kuster and I discussed patient, decision made to send to cath lab for angiography, RHC, and Impella.    RHC showed low output with elevated filling pressures.  Coronary angiography showed occluded mid LCx, 60% mid RCA, and 95% prox LAD with TIMI 2 flow.  Impella successfully placed.  I assess position under echo and moved it back to 3.6 cm.  Flow 3.5 L/min at P9.   Echo showed EF 20-25%, at least moderate MR.   Review of Systems: Unable to obtain, patient is intubated/sedated.   Home Medications Prior to Admission medications   Medication Sig Start Date End Date Taking? Authorizing Provider  albuterol (PROVENTIL) (2.5 MG/3ML) 0.083% nebulizer solution Take 3 mLs (2.5 mg total) by nebulization every 6 (six) hours as needed for wheezing or shortness of breath. 11/05/18   Tanda Rockers, MD  amLODipine (NORVASC) 10 MG tablet TAKE 1 TABLET BY MOUTH ONCE DAILY. 08/29/18   Susy Frizzle, MD  arformoterol (BROVANA) 15 MCG/2ML NEBU Take 2 mLs (15 mcg total) by nebulization 2 (two) times daily.  03/18/18   Tanda Rockers, MD  bisoprolol (ZEBETA) 5 MG tablet Take 1 tablet (5 mg total) by mouth daily. 11/09/17   Tanda Rockers, MD  budesonide (PULMICORT) 0.25 MG/2ML nebulizer solution One twice daily with brovana 08/26/18   Tanda Rockers, MD  cetirizine (ZYRTEC) 10 MG tablet Take 1 tablet (10 mg total) by mouth daily. 09/02/18   Valentina Shaggy, MD  clotrimazole (MYCELEX) 10 MG troche Take 1 tablet (10 mg total) by mouth 4 (four) times daily as needed. 11/05/18   Tanda Rockers, MD  dextromethorphan-guaiFENesin (MUCINEX DM) 30-600 MG 12hr tablet Take 1 tablet by mouth 2 (two) times daily.    [provider]  famotidine (PEPCID) 20 MG tablet TAKE ONE TABLET BY MOUTH AT BEDTIME. 11/11/18   Tanda Rockers, MD  ibuprofen (ADVIL,MOTRIN) 200 MG tablet Take 200 mg by mouth every 6 (six) hours as needed.    [provider]  montelukast (SINGULAIR) 10 MG tablet TAKE 1 TABLET BY MOUTH ONCE DAILY. 03/12/18   Tanda Rockers, MD  pantoprazole (PROTONIX) 40 MG tablet TAKE ONE TABLET BY MOUTH DAILY 30 TO 60 MINUTES BEFORE FIRST MEAL OF THE DAY. 10/18/18   Tanda Rockers, MD  predniSONE (DELTASONE) 10 MG tablet Two with bfast daily until better,  Then one daily x 5 days and stop 08/26/18   Tanda Rockers, MD  Respiratory Therapy Supplies (FLUTTER) DEVI 1 Device by Does not apply route  as needed. 10/25/17   Tanda Rockers, MD  Revefenacin (YUPELRI) 175 MCG/3ML SOLN Inhale 3 mLs into the lungs daily. 03/18/18   Tanda Rockers, MD  Roflumilast (DALIRESP) 250 MCG TABS Take 250 mcg by mouth daily. 07/24/18   Parrett, Fonnie Mu, NP  triamcinolone (NASACORT) 55 MCG/ACT AERO nasal inhaler Place 2 sprays into the nose daily.    [provider]  triamterene-hydrochlorothiazide (MAXZIDE-25) 37.5-25 MG tablet One daily as needed for leg swelling 04/15/18   Tanda Rockers, MD  VENTOLIN HFA 108 (90 Base) MCG/ACT inhaler INHALE 1 TO 2 PUFFS INTO LUNGS EVERY 4 HOURS AS NEEDED. 10/18/18   Tanda Rockers, MD    Past Medical History: Past Medical History:  Diagnosis Date   Arthritis    Asthma    COPD (chronic obstructive pulmonary disease) ([place])    Hypertension     Past Surgical History: Past Surgical History:  Procedure Laterality Date   ADENOIDECTOMY     HERNIA REPAIR     TONSILLECTOMY      Family History: Family History  Problem Relation Age of Onset   Hypertension Mother    Arthritis Father    Stroke Father    Diabetes Maternal Grandmother    Allergic rhinitis Neg Hx    Asthma Neg Hx    Eczema Neg Hx    Angioedema Neg Hx     Social History: Social History   Socioeconomic History   Marital status: Married    Spouse name: Not on file   Number of children: Not on file   Years of education: Not on file   Highest education level: Not on file  Occupational History   Occupation: Linemen  Scientist, product/process development strain: Not on file   Food insecurity:    Worry: Not on file    Inability: Not on file   Transportation needs:    Medical: Not on file    Non-medical: Not on file  Tobacco Use   Smoking status: Former Smoker    Packs/day: 1.50    Years: 48.00    Pack years: 72.00    Types: Cigarettes    Last attempt to quit: 10/28/2015    Years since quitting: 3.0   Smokeless tobacco: Never Used  Substance and Sexual Activity   Alcohol use: No   Drug use: No   Sexual activity: Not on file  Lifestyle   Physical activity:    Days per week: Not on file    Minutes per session: Not on file   Stress: Not on file  Relationships   Social connections:    Talks on phone: Not on file    Gets together: Not on file    Attends religious service: Not on file    Active member of club or organization: Not on file    Attends meetings of clubs or organizations: Not on file    Relationship status: Not on file  Other Topics Concern   Not on file  Social History Narrative   Not on file    Allergies:  Allergies    Allergen Reactions   Penicillins     Childhood allergy- unaware of reaction    Objective:    Vital Signs:   Temp:  [98.2 F (36.8 C)-99.6 F (37.6 C)] 98.6 F (37 C) (05/24 0424) Pulse Rate:  [25-150] 108 (05/24 1045) Resp:  [18-37] 18 (05/24 1045) BP: (70-112)/(49-94) 80/65 (05/24 1045) SpO2:  [83 %-100 %] 100 % (05/24  1045) Arterial Line BP: (76-139)/(33-82) 90/54 (05/24 1045) FiO2 (%):  [30 %-100 %] 100 % (05/24 0940) Weight:  [83.6 kg] 83.6 kg (05/24 0500) Last BM Date: 11/20/2018  Weight change: Filed Weights   11/08/2018 09-23-23 11/16/18 0619 11/08/2018 0500  Weight: 90.7 kg 83.2 kg 83.6 kg    Intake/Output:   Intake/Output Summary (Last 24 hours) at 10/25/2018 1340 Last data filed at 11/08/2018 1100 Gross per 24 hour  Intake 646.13 ml  Output 245 ml  Net 401.13 ml      Physical Exam    General:  Intubated/sedated.  HEENT: normal Neck: supple. JVP 10 cm. Carotids 2+ bilat; no bruits. No lymphadenopathy or thyromegaly appreciated. Cor: PMI nondisplaced. Irregular rate & rhythm. No rubs, gallops or murmurs. Lungs: Distant BS Abdomen: soft, nontender, nondistended. No hepatosplenomegaly. No bruits or masses. Good bowel sounds. Extremities: no cyanosis, clubbing, rash, edema Neuro: Sedated on vent   Telemetry   Atrial fibrillation rate 60s (personally reviewed)  EKG    Afib/RVR, lateral 1 mm STE, inferior ST depression (personally reviewed)  Labs   Basic Metabolic Panel: Recent Labs  Lab 10/26/2018 2144/09/22 11/16/18 0223 11/02/2018 0453 10/28/2018 0620  NA 136 138 139 137  K 4.1 4.5 5.2* 4.9  CL 104 104 105  --   CO2 20* 15* 17*  --   GLUCOSE 190* 238* 244*  --   BUN 37* 37* 62*  --   CREATININE 1.58* 2.38* 2.56*  --   CALCIUM 8.9 9.3 9.3  --   MG  --  2.3  --   --     Liver Function Tests: Recent Labs  Lab 11/16/18 0223  AST 27  ALT 19  ALKPHOS 58  BILITOT 0.6  PROT 7.1  ALBUMIN 3.6   No results for input(s): LIPASE, AMYLASE in the last 168  hours. No results for input(s): AMMONIA in the last 168 hours.  CBC: Recent Labs  Lab 10/25/2018 2146 11/16/2018 0453 11/01/2018 0620  WBC 10.0 27.1*  --   NEUTROABS 8.9*  --   --   HGB 12.4* 12.5* 11.9*  HCT 39.0 39.0 35.0*  MCV 94.0 93.3  --   PLT 232 248  --     Cardiac Enzymes: Recent Labs  Lab 11/18/2018 2146 11/16/18 0223 11/16/18 0718 11/16/18 1417 11/11/2018 0453  TROPONINI 0.34* 0.98* 3.29* 6.46* 10.33*    BNP: BNP (last 3 results) Recent Labs    11/16/18 0223  BNP 1,543.0*    ProBNP (last 3 results) No results for input(s): PROBNP in the last 8760 hours.   CBG: No results for input(s): GLUCAP in the last 168 hours.  Coagulation Studies: Recent Labs    11/04/2018 Sep 22, 2144  LABPROT 14.0  INR 1.1     Imaging   Dg Chest Port 1 View  Result Date: 11/07/2018 CLINICAL DATA:  Orogastric tube placement EXAM: PORTABLE CHEST 1 VIEW COMPARISON:  Nov 17, 2018 study obtained earlier in the day and April 15, 2018 FINDINGS: Orogastric tube tip is in the proximal stomach with the side port at the gastroesophageal junction. There is an endotracheal tube present with tip 6.3 cm above the carina. Central catheter tip is in the superior vena cava. No pneumothorax. Lungs appear somewhat hyperexpanded without evident edema or consolidation. There is chronic interstitial thickening throughout the lungs. Scattered areas of scarring noted. Heart is upper normal in size with pulmonary vascularity within normal limits. There is aortic atherosclerosis. There is aortic atherosclerosis. There is degenerative change in each  shoulder. IMPRESSION: Tube and catheter positions as described without pneumothorax. Note that the orogastric tube side port is at the gastroesophageal junction. Advise advancing orogastric tube 5-6 cm. Lungs hyperexpanded with areas of interstitial thickening and scarring, likely reflective of chronic inflammatory type change. No frank edema or consolidation. Heart upper  normal in size. Aortic Atherosclerosis (ICD10-I70.0). Electronically Signed   By: Lowella Grip III M.D.   On: 11/14/2018 10:32   Dg Chest Port 1 View  Result Date: 11/06/2018 CLINICAL DATA:  73 year old male with history of cardiogenic shock. EXAM: PORTABLE CHEST 1 VIEW COMPARISON:  Chest x-ray 11/06/2018. FINDINGS: Ill-defined opacity in the right lung base which may reflect an area of ground-glass attenuation airspace consolidation. Diffuse interstitial prominence noted throughout remaining portions of the lungs bilaterally, asymmetrically distributed (relative sparing of the left upper lobe). Diffuse peribronchial cuffing. No pleural effusions. No evidence of pulmonary edema. Decreased pulmonary vascular markings in the left upper lobe. Heart size is upper limits of normal. Upper mediastinal contours are within normal limits. Aortic atherosclerosis. IMPRESSION: 1. Ill-defined opacity in the right lung base concerning for developing right middle and/or right lower lobe pneumonia. 2. Diffuse peribronchial cuffing and interstitial prominence which may suggest an associated bronchitis. 3. Decreased pulmonary vascular markings in the left upper lobe. If there is any clinical concern for pulmonary embolism, further evaluation with PE protocol CT scan should be considered. 4. Aortic atherosclerosis. Electronically Signed   By: Vinnie Langton M.D.   On: 11/12/2018 06:07      Medications:     Current Medications:  [MAR Hold] arformoterol  15 mcg Nebulization BID   [MAR Hold] aspirin EC  81 mg Oral Daily   [MAR Hold] atorvastatin  80 mg Oral q1800   [MAR Hold] budesonide (PULMICORT) nebulizer solution  0.5 mg Nebulization BID   [MAR Hold] chlorhexidine gluconate (MEDLINE KIT)  15 mL Mouth Rinse BID   [MAR Hold] Chlorhexidine Gluconate Cloth  6 each Topical Daily   [MAR Hold] feeding supplement (ENSURE ENLIVE)  237 mL Oral BID BM   fentaNYL       [MAR Hold] fentaNYL (SUBLIMAZE) injection   25 mcg Intravenous Once   [MAR Hold] loratadine  10 mg Oral Daily   [MAR Hold] mouth rinse  15 mL Mouth Rinse 10 times per day   [MAR Hold] methylPREDNISolone (SOLU-MEDROL) injection  40 mg Intravenous Q12H   midazolam       [MAR Hold] multivitamin with minerals  1 tablet Oral Daily   [MAR Hold] pantoprazole  40 mg Oral BID AC   [MAR Hold] sodium chloride flush  10-40 mL Intracatheter Q12H   [MAR Hold] sodium chloride flush  3 mL Intravenous Q12H     Infusions:  amiodarone (CORDARONE) infusion 60 mg/hr (11/13/2018 1100)   Followed by   amiodarone (CORDARONE) infusion     DOBUTamine     DOBUTamine Stopped (11/03/2018 0837)   [MAR Hold] famotidine (PEPCID) IV Stopped (11/16/18 2331)   fentaNYL infusion INTRAVENOUS 25 mcg/hr (11/04/2018 1051)   heparin Stopped (11/08/2018 0859)   midazolam 2 mg/hr (11/16/2018 1327)   [MAR Hold] norepinephrine (LEVOPHED) Adult infusion 20 mcg/min (10/28/2018 1312)   [MAR Hold] sodium chloride       Assessment/Plan   1. Cardiogenic shock: Due to NSTEMI/ischemic cardiomyopathy.  Patient now with Impella in place at P9, 3.5 L/min flow.  Position confirmed by echo.  He is also on norepinephrine 20.  Echo with EF 20-25%. Swan in place in femoral vein.  -  Continue Impella at P9. Follow daily LDH.  - Can decrease norepinephrine as MAP tolerates.  - Elevated filling pressures, will give a dose of Lasix 60 mg IV x 1 and follow.  2. CAD: NSTEMI, occluded distal LCx but culprit is likely 95% pLAD.  TnI to 10.  - Continue heparin gtt (with Impella).  - Atorvastatin 80 daily, ASA 81 daily.  - Will need to return when renal function stabilizes for PCI to pLAD.   3. AKI: Creatinine up to 2.5, likely ATN due to cardiogenic shock.  Will need to follow carefully, hopefully will improve with increased cardiac output.  4. COPD: History of severe COPD.  However, suspect current episode is cardiac in origin.  5. Atrial fibrillation: With RVR, started last night  on dobutamine. Now rate controlled on amiodarone.   Length of Stay: 1  Loralie Champagne, MD  11/18/2018, 1:40 PM  Advanced Heart Failure Team Pager [phone] (M-F; [place])  Please contact [facility] for night-coverage after hours (4p -7a ) and weekends on [email or web address]  Low cardiac output this afternoon, milrinone started at 0.25 with CI up to 2.0 and co-ox 60% but patient's HR increased again to 130s.   - Bolus amiodarone, increase to 60 mg/hr.   Loralie Champagne 11/20/2018 11:08 PM

## 2018-11-17 NOTE — Consult Note (Signed)
MD decided on CVC placement

## 2018-11-17 NOTE — Progress Notes (Signed)
Spoke with Dr Aggie Moats and informed him of low B/P and urine outpt, also of a little history. Patient has been on Bipap most of the daylight shift and was placed back on it early on my shift only had a small amount of food and drink. BNP 1543 and last Troponin was 6.46 and Cardiology is aware, will give a small NSS bolus and continue to monitor.

## 2018-11-17 NOTE — Progress Notes (Addendum)
OG tube advanced 6 cm by this RN per radiology xray findings. External length of tube post advancement 56cm.   Tenna Child, RN 11/22/2018 1100

## 2018-11-18 ENCOUNTER — Inpatient Hospital Stay (HOSPITAL_COMMUNITY): Payer: Medicare Other

## 2018-11-18 ENCOUNTER — Other Ambulatory Visit: Payer: Self-pay

## 2018-11-18 DIAGNOSIS — Z95811 Presence of heart assist device: Secondary | ICD-10-CM

## 2018-11-18 LAB — COOXEMETRY PANEL
Carboxyhemoglobin: 1.7 % — ABNORMAL HIGH (ref 0.5–1.5)
Methemoglobin: 2.3 % — ABNORMAL HIGH (ref 0.0–1.5)
O2 Saturation: 59.9 %
Total hemoglobin: 10.4 g/dL — ABNORMAL LOW (ref 12.0–16.0)

## 2018-11-18 LAB — POCT I-STAT 7, (LYTES, BLD GAS, ICA,H+H)
Acid-base deficit: 4 mmol/L — ABNORMAL HIGH (ref 0.0–2.0)
Bicarbonate: 19.9 mmol/L — ABNORMAL LOW (ref 20.0–28.0)
Calcium, Ion: 1.24 mmol/L (ref 1.15–1.40)
HCT: 27 % — ABNORMAL LOW (ref 39.0–52.0)
Hemoglobin: 9.2 g/dL — ABNORMAL LOW (ref 13.0–17.0)
O2 Saturation: 93 %
Patient temperature: 38
Potassium: 4.4 mmol/L (ref 3.5–5.1)
Sodium: 136 mmol/L (ref 135–145)
TCO2: 21 mmol/L — ABNORMAL LOW (ref 22–32)
pCO2 arterial: 30.7 mmHg — ABNORMAL LOW (ref 32.0–48.0)
pH, Arterial: 7.422 (ref 7.350–7.450)
pO2, Arterial: 67 mmHg — ABNORMAL LOW (ref 83.0–108.0)

## 2018-11-18 LAB — ECHOCARDIOGRAM LIMITED
Height: 74 in
Weight: 2948.87 oz

## 2018-11-18 LAB — POCT ACTIVATED CLOTTING TIME
Activated Clotting Time: 131 seconds
Activated Clotting Time: 142 seconds
Activated Clotting Time: 142 seconds
Activated Clotting Time: 147 seconds
Activated Clotting Time: 147 seconds
Activated Clotting Time: 147 seconds
Activated Clotting Time: 147 seconds
Activated Clotting Time: 153 seconds
Activated Clotting Time: 153 seconds
Activated Clotting Time: 158 seconds
Activated Clotting Time: 158 seconds
Activated Clotting Time: 158 seconds
Activated Clotting Time: 158 seconds
Activated Clotting Time: 164 seconds
Activated Clotting Time: 169 seconds
Activated Clotting Time: 186 seconds

## 2018-11-18 LAB — URINALYSIS, ROUTINE W REFLEX MICROSCOPIC
Glucose, UA: 250 mg/dL — AB
Ketones, ur: 15 mg/dL — AB
Nitrite: POSITIVE — AB
Protein, ur: >300 mg/dL — AB
Specific Gravity, Urine: 1.01 (ref 1.005–1.030)
pH: 7 (ref 5.0–8.0)

## 2018-11-18 LAB — BASIC METABOLIC PANEL
Anion gap: 8 (ref 5–15)
BUN: 85 mg/dL — ABNORMAL HIGH (ref 8–23)
CO2: 22 mmol/L (ref 22–32)
Calcium: 8.2 mg/dL — ABNORMAL LOW (ref 8.9–10.3)
Chloride: 106 mmol/L (ref 98–111)
Creatinine, Ser: 3.42 mg/dL — ABNORMAL HIGH (ref 0.61–1.24)
GFR calc Af Amer: 20 mL/min — ABNORMAL LOW (ref >60)
GFR calc non Af Amer: 17 mL/min — ABNORMAL LOW (ref >60)
Glucose, Bld: 157 mg/dL — ABNORMAL HIGH (ref 70–99)
Potassium: 4.6 mmol/L (ref 3.5–5.1)
Sodium: 136 mmol/L (ref 135–145)

## 2018-11-18 LAB — URINALYSIS, MICROSCOPIC (REFLEX)

## 2018-11-18 LAB — CBC
HCT: 30.9 % — ABNORMAL LOW (ref 39.0–52.0)
Hemoglobin: 10.2 g/dL — ABNORMAL LOW (ref 13.0–17.0)
MCH: 30.1 pg (ref 26.0–34.0)
MCHC: 33 g/dL (ref 30.0–36.0)
MCV: 91.2 fL (ref 80.0–100.0)
Platelets: 237 10*3/uL (ref 150–400)
RBC: 3.39 MIL/uL — ABNORMAL LOW (ref 4.22–5.81)
RDW: 15.7 % — ABNORMAL HIGH (ref 11.5–15.5)
WBC: 25.9 10*3/uL — ABNORMAL HIGH (ref 4.0–10.5)
nRBC: 0.2 % (ref 0.0–0.2)

## 2018-11-18 LAB — LACTIC ACID, PLASMA: Lactic Acid, Venous: 1.5 mmol/L (ref 0.5–1.9)

## 2018-11-18 LAB — LACTATE DEHYDROGENASE: LDH: 1025 U/L — ABNORMAL HIGH (ref 98–192)

## 2018-11-18 LAB — PROCALCITONIN: Procalcitonin: 2.26 ng/mL

## 2018-11-18 LAB — STREP PNEUMONIAE URINARY ANTIGEN: Strep Pneumo Urinary Antigen: NEGATIVE

## 2018-11-18 MED ORDER — ACETAMINOPHEN 325 MG PO TABS
650.0000 mg | ORAL_TABLET | ORAL | Status: DC | PRN
  Administered 2018-11-18: 650 mg via ORAL
  Filled 2018-11-18: qty 2

## 2018-11-18 MED ORDER — DEXTROSE 5 % IV SOLN
30.0000 mg/h | INTRAVENOUS | Status: DC
  Filled 2018-11-18: qty 9

## 2018-11-18 MED ORDER — DEXTROSE 5 % IV SOLN
60.0000 mg/h | INTRAVENOUS | Status: DC
  Filled 2018-11-18 (×2): qty 9

## 2018-11-18 MED ORDER — PHENYLEPHRINE HCL-NACL 10-0.9 MG/250ML-% IV SOLN
0.0000 ug/min | INTRAVENOUS | Status: DC
  Filled 2018-11-18: qty 250

## 2018-11-18 MED ORDER — VASOPRESSIN 20 UNIT/ML IV SOLN
0.0300 [IU]/min | INTRAVENOUS | Status: DC
  Administered 2018-11-18 – 2018-11-22 (×3): 0.03 [IU]/min via INTRAVENOUS
  Filled 2018-11-18 (×3): qty 2

## 2018-11-18 MED ORDER — DEXTROSE 5 % IV SOLN
60.0000 mg/h | INTRAVENOUS | Status: AC
  Administered 2018-11-18: 60 mg/h via INTRAVENOUS
  Filled 2018-11-18: qty 9

## 2018-11-18 MED ORDER — DEXTROSE 5 % IV SOLN
30.0000 mg/h | INTRAVENOUS | Status: DC
  Administered 2018-11-19 – 2018-11-20 (×3): 30 mg/h via INTRAVENOUS
  Filled 2018-11-18 (×5): qty 9

## 2018-11-18 MED ORDER — SODIUM CHLORIDE 0.9 % IV SOLN
2.0000 g | INTRAVENOUS | Status: DC
  Administered 2018-11-18 – 2018-11-19 (×2): 2 g via INTRAVENOUS
  Filled 2018-11-18 (×2): qty 2

## 2018-11-18 NOTE — Progress Notes (Signed)
Progress Note  Patient Name: Armanie Martine Date of Encounter: 11/18/2018  Primary Cardiologist: Dr. Liam Rogers  Subjective   Postop day #1 cardiac catheterization and Impella insertion for ischemic cardiomyopathy and cardiogenic shock currently intubated on pressors and hemodynamically more stable.  He is sedated but responsive.  Inpatient Medications    Scheduled Meds:  arformoterol  15 mcg Nebulization BID   aspirin  81 mg Oral Daily   atorvastatin  80 mg Oral q1800   budesonide (PULMICORT) nebulizer solution  0.5 mg Nebulization BID   chlorhexidine gluconate (MEDLINE KIT)  15 mL Mouth Rinse BID   Chlorhexidine Gluconate Cloth  6 each Topical Daily   loratadine  10 mg Oral Daily   mouth rinse  15 mL Mouth Rinse 10 times per day   methylPREDNISolone (SOLU-MEDROL) injection  40 mg Intravenous Q12H   pantoprazole (PROTONIX) IV  40 mg Intravenous Q12H   sodium chloride flush  10-40 mL Intracatheter Q12H   sodium chloride flush  3 mL Intravenous Q12H   sodium chloride flush  3 mL Intravenous Q12H   Continuous Infusions:  sodium chloride 10 mL/hr at 11/18/18 0000   amiodarone (CORDARONE) infusion 60 mg/hr (11/18/18 0600)   ceFEPime (MAXIPIME) IV Stopped (11/18/18 0546)   famotidine (PEPCID) IV Stopped (11/16/2018 2201)   fentaNYL infusion INTRAVENOUS 40 mcg/hr (11/18/18 0600)   impella catheter heparin 50 unit/mL in dextrose 5%     heparin 100 Units/hr (11/18/18 0600)   midazolam 0.5 mg/hr (11/18/18 0600)   norepinephrine (LEVOPHED) Adult infusion 40 mcg/min (11/18/18 0600)   sodium chloride     vasopressin (PITRESSIN) infusion - *FOR SHOCK* 0.03 Units/min (11/18/18 0600)   PRN Meds: sodium chloride, acetaminophen, fentaNYL, ipratropium-albuterol, midazolam, midazolam, nitroGLYCERIN, ondansetron (ZOFRAN) IV, sennosides, sodium chloride flush, sodium chloride flush   Vital Signs    Vitals:   11/18/18 0645 11/18/18 0700 11/18/18 0736 11/18/18  0800  BP:    115/75  Pulse: 98 97  95  Resp: 18 19  (!) 21  Temp: 99.7 F (37.6 C) 99.7 F (37.6 C)    TempSrc:    Core  SpO2: 94% 95% 96% 94%  Weight:      Height:        Intake/Output Summary (Last 24 hours) at 11/18/2018 0824 Last data filed at 11/18/2018 0700 Gross per 24 hour  Intake 2223.71 ml  Output 635 ml  Net 1588.71 ml   Last 3 Weights 10/29/2018 11/16/2018 11/18/2018  Weight (lbs) 184 lb 4.9 oz 183 lb 6.8 oz 200 lb  Weight (kg) 83.6 kg 83.2 kg 90.719 kg      Telemetry    Sinus rhythm with PVCs personally Reviewed  ECG    Atrial fibrillation at 111 with septal Q waves and inferior ST segment depression- Personally Reviewed  Physical Exam   GEN:  Intubated, sedated but responsive Neck: No JVD Cardiac: RRR, no murmurs, rubs, or gallops.  Respiratory: Clear to auscultation bilaterally. GI: Soft, nontender, non-distended  MS: No edema; No deformity. Neuro:  Nonfocal  Psych: Normal affect  Extremities: Feet are warm with palpable pulses  Labs    Chemistry Recent Labs  Lab 11/16/18 0223 11/13/2018 0453  11/09/2018 1618 11/18/18 0220 11/18/18 0234  NA 138 139   < > 136 136 136  K 4.5 5.2*   < > 4.7 4.6 4.4  CL 104 105  --   --  106  --   CO2 15* 17*  --   --  22  --   GLUCOSE 238* 244*  --   --  157*  --   BUN 37* 62*  --   --  85*  --   CREATININE 2.38* 2.56*  --   --  3.42*  --   CALCIUM 9.3 9.3  --   --  8.2*  --   PROT 7.1  --   --   --   --   --   ALBUMIN 3.6  --   --   --   --   --   AST 27  --   --   --   --   --   ALT 19  --   --   --   --   --   ALKPHOS 58  --   --   --   --   --   BILITOT 0.6  --   --   --   --   --   GFRNONAA 26* 24*  --   --  17*  --   GFRAA 31* 28*  --   --  20*  --   ANIONGAP 19* 17*  --   --  8  --    < > = values in this interval not displayed.     Hematology Recent Labs  Lab 10/27/2018 0453  11/03/2018 2222 11/18/18 0220 11/18/18 0234  WBC 27.1*  --  25.8* 25.9*  --   RBC 4.18*  --  3.44* 3.39*  --   HGB  12.5*   < > 10.5* 10.2* 9.2*  HCT 39.0   < > 31.6* 30.9* 27.0*  MCV 93.3  --  91.9 91.2  --   MCH 29.9  --  30.5 30.1  --   MCHC 32.1  --  33.2 33.0  --   RDW 15.7*  --  15.7* 15.7*  --   PLT 248  --  241 237  --    < > = values in this interval not displayed.    Cardiac Enzymes Recent Labs  Lab 11/16/18 0223 11/16/18 0718 11/16/18 1417 11/22/2018 0453  TROPONINI 0.98* 3.29* 6.46* 10.33*   No results for input(s): TROPIPOC in the last 168 hours.   BNP Recent Labs  Lab 11/16/18 0223  BNP 1,543.0*     DDimer No results for input(s): DDIMER in the last 168 hours.   Radiology    Dg Chest 1 View  Result Date: 11/18/2018 CLINICAL DATA:  Fevers EXAM: CHEST  1 VIEW COMPARISON:  11/14/2018 FINDINGS: Cardiac shadow is stable. Aortic calcifications are again seen. Endotracheal tube and gastric catheter are noted and stable. Impella catheter is noted in place new from the prior study. Swan-Ganz catheter is noted within the right lower lobe from a and inferior approach. Left jugular central line is noted in the mid superior vena cava. Lungs are well aerated bilaterally. Mild vascular congestion is noted. No focal infiltrate or sizable effusion is seen. IMPRESSION: Tubes and lines as described above. Mild vascular congestion stable from the prior exam. Electronically Signed   By: Inez Catalina M.D.   On: 11/18/2018 02:22   Dg Chest Port 1 View  Result Date: 11/21/2018 CLINICAL DATA:  Orogastric tube placement EXAM: PORTABLE CHEST 1 VIEW COMPARISON:  Nov 17, 2018 study obtained earlier in the day and April 15, 2018 FINDINGS: Orogastric tube tip is in the proximal stomach with the side port at the gastroesophageal junction. There is an endotracheal tube present with  tip 6.3 cm above the carina. Central catheter tip is in the superior vena cava. No pneumothorax. Lungs appear somewhat hyperexpanded without evident edema or consolidation. There is chronic interstitial thickening throughout the  lungs. Scattered areas of scarring noted. Heart is upper normal in size with pulmonary vascularity within normal limits. There is aortic atherosclerosis. There is aortic atherosclerosis. There is degenerative change in each shoulder. IMPRESSION: Tube and catheter positions as described without pneumothorax. Note that the orogastric tube side port is at the gastroesophageal junction. Advise advancing orogastric tube 5-6 cm. Lungs hyperexpanded with areas of interstitial thickening and scarring, likely reflective of chronic inflammatory type change. No frank edema or consolidation. Heart upper normal in size. Aortic Atherosclerosis (ICD10-I70.0). Electronically Signed   By: Lowella Grip III M.D.   On: 10/26/2018 10:32   Dg Chest Port 1 View  Result Date: 11/05/2018 CLINICAL DATA:  73 year old male with history of cardiogenic shock. EXAM: PORTABLE CHEST 1 VIEW COMPARISON:  Chest x-ray 11/10/2018. FINDINGS: Ill-defined opacity in the right lung base which may reflect an area of ground-glass attenuation airspace consolidation. Diffuse interstitial prominence noted throughout remaining portions of the lungs bilaterally, asymmetrically distributed (relative sparing of the left upper lobe). Diffuse peribronchial cuffing. No pleural effusions. No evidence of pulmonary edema. Decreased pulmonary vascular markings in the left upper lobe. Heart size is upper limits of normal. Upper mediastinal contours are within normal limits. Aortic atherosclerosis. IMPRESSION: 1. Ill-defined opacity in the right lung base concerning for developing right middle and/or right lower lobe pneumonia. 2. Diffuse peribronchial cuffing and interstitial prominence which may suggest an associated bronchitis. 3. Decreased pulmonary vascular markings in the left upper lobe. If there is any clinical concern for pulmonary embolism, further evaluation with PE protocol CT scan should be considered. 4. Aortic atherosclerosis. Electronically Signed    By: Vinnie Langton M.D.   On: 10/28/2018 06:07    Cardiac Studies   2D echocardiogram (10/26/2018)  IMPRESSIONS    1. The left ventricle has severely reduced systolic function, with an ejection fraction of 20-25%. The cavity size was normal. Left ventricular diastolic function could not be evaluated.  2. The right ventricle has normal systolic function. The cavity was normal.  3. The mitral valve is grossly normal. There is mild mitral annular calcification present. Mitral valve regurgitation is moderate by color flow Doppler.  4. The aortic valve was not well visualized. No stenosis of the aortic valve.  5. Extremely limited; definity used; severe global reduction in LV systolic function; eccentric MR difficult to quantitate but likely moderate.  Cardiac catheterization (11/16/2018)  Conclusion     Mid RCA lesion is 60% stenosed.  Mid Cx to Dist Cx lesion is 100% stenosed.  Prox LAD lesion is 95% stenosed.     Patient Profile     73 year old Caucasian male transferred from any [facility] where he was admitted with COPD and respiratory failure.  He had a troponin of 1 initially.  Over the course of the next 24 hours his troponin increased as to his lactate, his blood pressure decreased and he was placed on BiPAP and ultimately intubated.  He was taken to the Cath Lab urgently yesterday morning revealing a 95% proximal LAD with TIMI II flow, and occluded circumflex with a 50 to 60% mid dominant RCA stenosis.  His EF was 20%.  He did have progressive renal insufficiency with anuria.  An Impella hemodynamic assist device was inserted.  Assessment & Plan    1: Ischemic cardiomyopathy- EF  20% by 2D echo yesterday with cardiac catheterization revealing a high-grade proximal LAD and occluded circumflex.  He currently is on pressors including Levophed as well as vasopressin and Impella for hemodynamic support at P8 with a cardiac output of 3.4 L/min, PAD in the 20s and CVP of 10.   His troponin peaked at 10.  His lactate level peaked at 10 as well and is come back down to the 1 level.  His CPO is 1.  He will ultimately need LAD intervention but this will need to be delayed until his renal function is improved.  He will probably need this with Impella hemodynamic support.  2: Respiratory failure- intubated, sedated with good sats.  His lungs are clear.  Chest x-ray showed mild vascular congestion unchanged.  His white count is 25,000.  3: Acute renal failure- serum creatinine continues to climb currently at 3.42.  His urine output is approxi-20 cc/h and his urine is red.  Question whether this represents hemolysis.  For questions or updates, please contact [place] Please consult [email or web address] for contact info under        Signed, Quay Burow, MD  11/18/2018, 8:24 AM

## 2018-11-18 NOTE — Progress Notes (Signed)
ANTICOAGULATION CONSULT NOTE - Follow Up Consult  Pharmacy Consult for Heparin Indication: chest pain/ACS > now for Impella  Allergies  Allergen Reactions  . Penicillins     Childhood allergy- unaware of reaction    Patient Measurements: Height: 6\' 2"  (188 cm) Weight: 184 lb 4.9 oz (83.6 kg) IBW/kg (Calculated) : 82.2 Heparin Dosing Weight: 83.2 kg  Vital Signs: Temp: 99.7 F (37.6 C) (05/25 1200) Temp Source: Core (05/25 1200) BP: 107/72 (05/25 1200) Pulse Rate: 89 (05/25 1200)  Labs: Recent Labs    05-Dec-2018 2146 11/16/18 0223 11/16/18 0718 11/16/18 1417 11/20/2018 0453  11/01/2018 2222 11/18/18 0220 11/18/18 0234  HGB 12.4*  --   --   --  12.5*   < > 10.5* 10.2* 9.2*  HCT 39.0  --   --   --  39.0   < > 31.6* 30.9* 27.0*  PLT 232  --   --   --  248  --  241 237  --   APTT 28  --   --   --   --   --   --   --   --   LABPROT 14.0  --   --   --   --   --   --   --   --   INR 1.1  --   --   --   --   --   --   --   --   HEPARINUNFRC  --   --  0.53 0.61 0.68  --   --   --   --   CREATININE 1.58* 2.38*  --   --  2.56*  --   --  3.42*  --   TROPONINI 0.34* 0.98* 3.29* 6.46* 10.33*  --   --   --   --    < > = values in this interval not displayed.    Estimated Creatinine Clearance: 23 mL/min (A) (by C-G formula based on SCr of 3.42 mg/dL (H)).  Assessment: 73 yo male on IV heparin  is s/p cath lab where Impella CP was placed.  Heparin running in purge solution now - purge flow rate at 17.4 mL/hr (870 units/hr and systemic heparin at 700 units/hr after multiple adjustments. He is noted with hematuria -ACT= 147  Goal of Therapy:  ACT 160-180 Heparin level 0.3-0.7 units/ml Monitor platelets by anticoagulation protocol: Yes   Plan:  Increase heparin to 900 units/hr (larger increase due to minimal ACT changes) ACT in 1hr per protocol   Harland German, PharmD Clinical Pharmacist **Pharmacist phone directory can now be found on [email or web address] ([email or web address]).  Listed under [facility]  Pharmacy.

## 2018-11-18 NOTE — Progress Notes (Signed)
Under 2D echocardiographic guidance we repositioned the Impella device from 5.3 cm beyond the valve to just below 4 cm to optimize function.  Runell Gess, M.D., FACP, [facility], [facility] [place] [address] [place], [place]  [address]  [phone] 11/18/2018 9:04 AM

## 2018-11-18 NOTE — Significant Event (Addendum)
PCCM BEDSIDE EVALUATION  Reason for Evaluation: Hypotension Called at 212 AM Brief History: 73 yr old M w/ PMHx Afib,COPD, currently intubated and being managed for Cardiogenic shock s/p Impella  Baseline prior to Hypotensive episode at 1:30 AM:  MAP 60 Vitals:Marland Kitchen.Blood pressure (!) 89/50, pulse (!) 107, temperature (!) 100.4 F (38 C), resp. rate (!) 21, height 6\' 2"  (1.88 m), weight 83.6 kg, SpO2 93 %.  Impella reading CO 5L Swan  CO 5.01 SVR 750>> (820 post interventions) CoOx 59.9 CVP  HR 108-120>>>> pt received an Amio bolus prior to my evaluation  Current drips: Levophed Receiving a 250cc bolus of NS Vasopressin 0.03 Milrinone>>>>off Heparin  ABG:7.422/30/67/19  PCT 0.42 post multiple surgical interventions and invasive therapies  Dark urine in Foley UA +leuk + nitrites +hyaline casts + mucus + granular casts Total UOP during this shift: 370cc Pt has received 140 mg Lasix in last 24 hrs   CCM Bedside US : shows no pericardial effusion, no intracardiac thrombus limited windows rhythm correlates with Afib RVR + Kerley B lines w/ sliding  (on multiple views during lung fields exam)  Most recent CXR at 219 AM FINDINGS: Cardiac shadow is stable. Aortic calcifications are again seen. Endotracheal tube and gastric catheter are noted and stable. Impella catheter is noted in place new from the prior study. Swan-Ganz catheter is noted within the right lower lobe from a and inferior approach. Left jugular central line is noted in the mid superior vena cava. Lungs are well aerated bilaterally. Mild vascular congestion is noted. No focal infiltrate or sizable effusion isSeen.  Active Issues : 1. Hypotension in the setting of Cardiogenic shock s/p Impella: Beside US shows no evidence of pericardial effusion or tamponade Pt has received an amio bolus recently and was on Milrinone  Primary team has a concern for sepsis.  >>Blood cultures x 2 days ago remain negative.  >>Temp  has been elevated since 2251 >>WBC 27 to 25 >>UA + leuk and nitrites  >>CoOx 59.9 >>Pt has ARF so Vancomycin even if renally dosed could cause further injury >>if there is a septic component cefepime may be a superior choice to Vancomycin given the possible urinary source and it will  Give good Gram negative coverage and still cover some gram positives,  2. Acute Renal Failure: UA shows significant proteinuria and casts  which could represent a combination of glomerular and tubular injury pt is oliguric and declining response to diuretics. May need RRT.  3. Cardiogenic Pulmonary edema:  Kerley B lines on bedside US and mild vasc congestion on CXR FiO2 40% and PEEP 5 Po2 67.   Plan: Rx temp w/ prn tylenol and ext cooling measures Start on renally dosed Cefepime for empiric coverage F/u cultures ordered by Cardiology urine, trach and blood cx Continue Levophed and Vasopressin for MAP goal >60 Trend WBC and fever curve Avoid nephrotoxic medications Recommend Consult with Nephrology to evaluate if pt is a candidate for RRT  Signed Dr Newell Coral Pulmonary Critical Care Locums

## 2018-11-18 NOTE — Progress Notes (Signed)
  Echocardiogram 2D Echocardiogram has been performed.  Eric Spencer 11/18/2018, 9:19 AM

## 2018-11-18 NOTE — Progress Notes (Signed)
Contacted Dr. Mayford Knife, inform of pt status, B/P 69/49 map 55, HR 144, temp 100.4, She ordered 200cc bolus, hold milrinone, start vasopressor, draw UA culture, Blood cultures, stat chest xray, drawn labs, ABG and called back with COox. Called Elink to have CCM come see patient.

## 2018-11-18 NOTE — Progress Notes (Addendum)
Called due to patient's BP running low.  Had afib with RVR earlier and given IV Amio bolus and placed back on 60mg /hr.  Levo gtt titrated up to  To maintain MAP of 65.  HR initially improved but now back up in the 140's.  Now spiking a temp at 100.5 and likely driving his tachycardia.  With BP decreasing and now with fever ? Sepsis.  COVID negative.  WBC remains elevated but on steroids.    Swan readings show the following CO 5.01 CI 2.39 SVR 750 PAP 26/20 CVP 7-47mmHg (RAP was 17 at cath and has been getting diuretics)  Will start vasopressin gtt and continue on Levo at 40 to try to maintain MAP > 65.  Will check another co-ox.  Check procalcitonin.  His CVP is running lower than what his RAP was at cath and his urine output has dropped off and appears concentrated.  I will try a 200cc fluid bolus. I have asked CCM to see patient tonight in regard to possible needs for antibx in setting of fever spike. I have asked Pharmacy to dose Vancomycin.  Will pan culture and check repeat cxray. Continue IV Amio.  Cannot rebolus with Amio for elevated HR until BP improves.  Tylenol for fever which is likely driving afib some. Discussed with Dr. Shirlee Latch in regards to need for repeat echo but feels Impella is ok since there are no alarms at this time.

## 2018-11-18 NOTE — Progress Notes (Signed)
Nutrition Follow-up  DOCUMENTATION CODES:   Not applicable  INTERVENTION:   If unable to extubate in 24-48 hours  Recommend tube feeding:  -Vital AF 1.2 @ 20 ml/hr via OGT -Increase by 10 ml Q4 hours to goal rate of 65 ml/hr (1560 ml) -30 ml Prostat once daily   At goal TF provides: 1972 kcals, 132 grams protein, 1265 ml free water. Meets 100% of needs.   NUTRITION DIAGNOSIS:   Increased nutrient needs related to chronic illness(COPD) as evidenced by estimated needs.  Ongoing  GOAL:   Patient will meet greater than or equal to 90% of their needs  Not meeting- NPO  MONITOR:   Diet advancement, Weight trends, Labs, I & O's  REASON FOR ASSESSMENT:   Consult COPD Protocol  ASSESSMENT:   73 year old male who presented to the ED on 5/22 with SOB. PMH of COPD, GERD, HTN. Pt admitted with COPD exacerbation, AKI, and NSTEMI.   5/24- intubated, s/p cardiac cath and impella, swan   RD working remotely.  Per CCM pt with worsening renal function likely related to cardiogenic shock. Nephrology consulted to assess for possible CRRT. Remains hypotensive, requiring pressors. Plan for LAD once hemodynamically stable. Will discuss initiation of enteral feeding with CCM.   Weight noted to remain stable at 83 kg. Will continue to monitor trends. Will utilize EDW of 83.2 kg to estimate needs.   Patient is currently intubated on ventilator support MV: 11.2 L/min Temp (24hrs), Avg:99.4 F (37.4 C), Min:95.9 F (35.5 C), Max:100.6 F (38.1 C) BP: 105/65 MAP: 78 (A-line)  I/O: 660 ml x 24 hrs  UOP: +3,156 ml since admit  Drips: amiodarone, levophed, vasopressin Medications: solumedrol Labs: creatinine 3.24- trending up  Diet Order:   Diet Order            Diet NPO time specified  Diet effective now              EDUCATION NEEDS:   Not appropriate for education at this time  Skin:  Skin Assessment: Reviewed RN Assessment  Last BM:  5/22  Height:   Ht Readings  from Last 1 Encounters:  11/14/2018 6\' 2"  (1.88 m)    Weight:   Wt Readings from Last 1 Encounters:  11/10/2018 83.6 kg    Ideal Body Weight:  86.4 kg  BMI:  Body mass index is 23.66 kg/m.  Estimated Nutritional Needs:   Kcal:  1972 kcal  Protein:  120-135 grams  Fluid:  >/= 1.9 L/day   Vanessa Kick RD, LDN Clinical Nutrition Pager # - [phone]

## 2018-11-18 NOTE — Consult Note (Signed)
NAME:  Eric Spencer, MRN:  [id number], DOB:  04/17/1946, LOS: 2 ADMISSION DATE:  11/07/2018, CONSULTATION DATE:  11/16/18 REFERRING MD:  Dr. Delton SeeNelson, CHIEF COMPLAINT:  SOB   Brief History   73 y/o M, former smoker (quit 3 years ago), [facility]. Work up concerning for possible AECOPD, NSTEMI, AKI.  Transferred to ICU 5/24 with SOB and concern for cardiogenic shock.   Past Medical History  COPD  HTN  Allergies  Arthritis   Significant Hospital Events   5/23 Admit  5/25 - Pt reports he was sleeping and woke due to nursing being concerned for hypotension.  Transferred to ICU.  Consults:  PCCM  Procedures:  ETT 5/24 -  CVL 5/24 -   Significant Diagnostic Tests:    Micro Data:  COVID 5/22 >> negative  BCx2 5/23 >>  RVP 5/24 >>   Antimicrobials:  Doxycycline 5/23 >> 5/24 Cefepime 5/ 25 >>  Interim history/subjective:    5/25 - circulatory shock + with impellam levophed 35, vasopressin, -> COOX 60% ., Making 60ccc bloody urine. On stress dose steroids. On antibiotics. On IV amio for PAF with RVR. Cards feeks NSTEMi culprit lesion in s 95% pPLAD. Trop is at 10. On IV heparin. Will need to start DAPT  Sedated with fent and versed. On 40% fio2.  Creat woprse at 3.4 but making urine  Objective   Blood pressure 118/81, pulse 87, temperature 99.9 F (37.7 C), resp. rate (!) 21, height 6\' 2"  (1.88 m), weight 83.6 kg, SpO2 94 %. PAP: (16-43)/(13-27) 39/25 CVP:  [6 mmHg-13 mmHg] 10 mmHg PCWP:  [14 mmHg-26 mmHg] 25 mmHg CO:  [2.3 L/min-6.1 L/min] 6.1 L/min CI:  [1.1 L/min/m2-2.9 L/min/m2] 2.9 L/min/m2  Vent Mode: PRVC FiO2 (%):  [40 %] 40 % Set Rate:  [18 bmp] 18 bmp Vt Set:  [650 mL] 650 mL PEEP:  [5 cmH20] 5 cmH20 Plateau Pressure:  [14 cmH20-18 cmH20] 16 cmH20   Intake/Output Summary (Last 24 hours) at 11/18/2018 1105 Last data filed at 11/18/2018 1100 Gross per 24 hour  Intake 2471.2 ml  Output 745 ml  Net 1726.2 ml   Filed  Weights   11/18/2018 2125 11/16/18 0619 11/13/2018 0500  Weight: 90.7 kg 83.2 kg 83.6 kg    General Appearance:  Looks criticall ill Head:  Normocephalic, without obvious abnormality, atraumatic Eyes:  PERRL - yes, conjunctiva/corneas - muddy     Ears:  Normal external ear canals, both ears Nose:  G tube - no Throat:  ETT TUBE - yes , OG tube - yes Neck:  Supple,  No enlargement/tenderness/nodules Lungs: Clear to auscultation bilaterally, Ventilator   Synchrony - yes, 40% fio2 Heart:  S1 and S2 normal, no murmur, CVP - x.  Pressors - yes Abdomen:  Soft, no masses, no organomegaly Genitalia / Rectal:  Has cathter in rt femoral area +. Groin looks intact Extremities:  Extremities- intact Skin:  ntact in exposed areas . Sacral area - not examind Neurologic:  Sedation - fent gtt, versed gtt -> RASS - -3      LABS    PULMONARY Recent Labs  Lab 11/16/18 0220 11/03/2018 0400 11/09/2018 0620 11/04/2018 1618 10/31/2018 1810 11/18/18 0205 11/18/18 0234  PHART 7.325* 7.363 7.341* 7.361  --   --  7.422  PCO2ART 32.1 35.3 34.7 39.1  --   --  30.7*  PO2ART 94.5 85.9 77.0* 510.0*  --   --  67.0*  HCO3 16.2*  19.6* 18.8* 22.1  --   --  19.9*  TCO2  --   --  20* 23  --   --  21*  O2SAT 96.3 95.8 95.0 100.0 60.7 59.9 93.0    CBC Recent Labs  Lab 11-28-18 0453  Nov 28, 2018 2222 11/18/18 0220 11/18/18 0234  HGB 12.5*   < > 10.5* 10.2* 9.2*  HCT 39.0   < > 31.6* 30.9* 27.0*  WBC 27.1*  --  25.8* 25.9*  --   PLT 248  --  241 237  --    < > = values in this interval not displayed.    COAGULATION Recent Labs  Lab 11/11/2018 2146  INR 1.1    CARDIAC   Recent Labs  Lab 11/01/2018 2146 11/16/18 0223 11/16/18 0718 11/16/18 1417 11-28-18 0453  TROPONINI 0.34* 0.98* 3.29* 6.46* 10.33*   No results for input(s): PROBNP in the last 168 hours.   CHEMISTRY Recent Labs  Lab 11/06/2018 2146 11/16/18 0223 Nov 28, 2018 0453 11-28-18 0620 2018-11-28 1618 11/18/18 0220 11/18/18 0234  NA 136 138  139 137 136 136 136  K 4.1 4.5 5.2* 4.9 4.7 4.6 4.4  CL 104 104 105  --   --  106  --   CO2 20* 15* 17*  --   --  22  --   GLUCOSE 190* 238* 244*  --   --  157*  --   BUN 37* 37* 62*  --   --  85*  --   CREATININE 1.58* 2.38* 2.56*  --   --  3.42*  --   CALCIUM 8.9 9.3 9.3  --   --  8.2*  --   MG  --  2.3  --   --   --   --   --    Estimated Creatinine Clearance: 23 mL/min (A) (by C-G formula based on SCr of 3.42 mg/dL (H)).   LIVER Recent Labs  Lab 11/09/2018 2146 11/16/18 0223  AST  --  27  ALT  --  19  ALKPHOS  --  58  BILITOT  --  0.6  PROT  --  7.1  ALBUMIN  --  3.6  INR 1.1  --      INFECTIOUS Recent Labs  Lab 11-28-18 0453 11-28-2018 1616 11/18/18 0220 11/18/18 0837  LATICACIDVEN 3.7* 3.6* 1.5  --   PROCALCITON 0.42  --   --  2.26     ENDOCRINE CBG (last 3)  No results for input(s): GLUCAP in the last 72 hours.       IMAGING x48h  - image(s) personally visualized  -   highlighted in bold Dg Chest 1 View  Result Date: 11/18/2018 CLINICAL DATA:  Fevers EXAM: CHEST  1 VIEW COMPARISON:  11-28-2018 FINDINGS: Cardiac shadow is stable. Aortic calcifications are again seen. Endotracheal tube and gastric catheter are noted and stable. Impella catheter is noted in place new from the prior study. Swan-Ganz catheter is noted within the right lower lobe from a and inferior approach. Left jugular central line is noted in the mid superior vena cava. Lungs are well aerated bilaterally. Mild vascular congestion is noted. No focal infiltrate or sizable effusion is seen. IMPRESSION: Tubes and lines as described above. Mild vascular congestion stable from the prior exam. Electronically Signed   By: Alcide Clever M.D.   On: 11/18/2018 02:22   Dg Chest Port 1 View  Result Date: 2018-11-28 CLINICAL DATA:  Orogastric tube placement EXAM: PORTABLE CHEST 1 VIEW COMPARISON:  Nov 17, 2018 study obtained earlier in the day and April 15, 2018 FINDINGS: Orogastric tube tip is in the  proximal stomach with the side port at the gastroesophageal junction. There is an endotracheal tube present with tip 6.3 cm above the carina. Central catheter tip is in the superior vena cava. No pneumothorax. Lungs appear somewhat hyperexpanded without evident edema or consolidation. There is chronic interstitial thickening throughout the lungs. Scattered areas of scarring noted. Heart is upper normal in size with pulmonary vascularity within normal limits. There is aortic atherosclerosis. There is aortic atherosclerosis. There is degenerative change in each shoulder. IMPRESSION: Tube and catheter positions as described without pneumothorax. Note that the orogastric tube side port is at the gastroesophageal junction. Advise advancing orogastric tube 5-6 cm. Lungs hyperexpanded with areas of interstitial thickening and scarring, likely reflective of chronic inflammatory type change. No frank edema or consolidation. Heart upper normal in size. Aortic Atherosclerosis (ICD10-I70.0). Electronically Signed   By: Bretta Bang III M.D.   On: 11/14/2018 10:32   Dg Chest Port 1 View  Result Date: 10/26/2018 CLINICAL DATA:  73 year old male with history of cardiogenic shock. EXAM: PORTABLE CHEST 1 VIEW COMPARISON:  Chest x-ray Nov 16, 2018. FINDINGS: Ill-defined opacity in the right lung base which may reflect an area of ground-glass attenuation airspace consolidation. Diffuse interstitial prominence noted throughout remaining portions of the lungs bilaterally, asymmetrically distributed (relative sparing of the left upper lobe). Diffuse peribronchial cuffing. No pleural effusions. No evidence of pulmonary edema. Decreased pulmonary vascular markings in the left upper lobe. Heart size is upper limits of normal. Upper mediastinal contours are within normal limits. Aortic atherosclerosis. IMPRESSION: 1. Ill-defined opacity in the right lung base concerning for developing right middle and/or right lower lobe pneumonia. 2.  Diffuse peribronchial cuffing and interstitial prominence which may suggest an associated bronchitis. 3. Decreased pulmonary vascular markings in the left upper lobe. If there is any clinical concern for pulmonary embolism, further evaluation with PE protocol CT scan should be considered. 4. Aortic atherosclerosis. Electronically Signed   By: Trudie Reed M.D.   On: 11/09/2018 06:07     Resolved Hospital Problem list      Assessment & Plan:   ASSESSMENT / PLAN:  PULMONARY A:  Known copd nos Acute resp failure due to cardiogenic shock  11/18/2018 -> on 40% fio2 but does not meet sbt criteira due to cardiogenic shock  P:   Full vent support   NEUROLOGIC A:   Sedated with fent and versed gtt  11/18/2018 - rass -3 and can follow some command  P:   Continue fent gtt and versed gtt rass goal -3   VASCULAR A:   Circulatory cardiogenic shock ongoing  P:  MAP goal > 65  CARDIAC A: NSTEMI with trop 10  P: IV heparin  ASA Cards managing - needs cath, DAPT   INFECTIOUS A:   Concern for pneumonia P:   Check urine strep and legionella Anti-infectives (From admission, onward)   Start     Dose/Rate Route Frequency Ordered Stop   11/18/18 0400  ceFEPIme (MAXIPIME) 2 g in sodium chloride 0.9 % 100 mL IVPB     2 g 200 mL/hr over 30 Minutes Intravenous Every 24 hours 11/18/18 0358     11/16/18 0300  doxycycline (VIBRA-TABS) tablet 100 mg  Status:  Discontinued     100 mg Oral Every 12 hours 11/16/18 0200 11/16/18 0845       RENAL A:  AKi heading into ATN but started  making urine 11/18/2018  P:  Maintain bp/hr  ELECTROLYTES A:  At risk electrolyte imbalance P: monitor   GASTROINTESTINAL A:   NPO  P:   h2 blockade  HEMATOLOGIC A:  Anemia critical illness   P:  - PRBC for hgb </= 8.0 gm%   In setting of ACS     ENDOCRINE A:   ? RAI   P:   On solmderol ( ? For copd v RAI - will investigate)  MSK/DERM At risk sacral decub  Plan   monitor    Best practice:  Diet: As tolerated  Pain/Anxiety/Delirium protocol (if indicated): n/a VAP protocol (if indicated): n/a DVT prophylaxis: heparin gtt  GI prophylaxis: pepcid BID  Glucose control: per primary  Mobility: as tolerated  Code Status: Full Code  Family Communication: Patient updated on plan of care 5/24  Cards managing family communiation Disposition: Per Cardiology.    ATTESTATION & SIGNATURE   The patient Eric Spencer is critically ill with multiple organ systems failure and requires high complexity decision making for assessment and support, frequent evaluation and titration of therapies, application of advanced monitoring technologies and extensive interpretation of multiple databases.   Critical Care Time devoted to patient care services described in this note is  30  Minutes. This time reflects time of care of this signee Dr Kalman Shan. This critical care time does not reflect procedure time, or teaching time or supervisory time of PA/NP/Med student/Med Resident etc but could involve care discussion time     Dr. Kalman Shan, M.D., [facility].C.P Pulmonary and Critical Care Medicine Staff Physician [facility] [place] [facility] Pager: [phone], If no answer or between  15:00h - 7:00h: call [phone]  11/18/2018 11:06 AM

## 2018-11-18 NOTE — Progress Notes (Signed)
Advanced Heart Failure Rounding Note   Subjective:    Remains intubated/sedated on FiO2 40%. Was hypotensive last night.  Now on NE 35 and vasopressin. Co-ox 60% SBP 100-110. Making about 60cc of bloody urine. ACT low.   Started on stress-dose steroids and broad spectrum abx as well. On IV amio for PAF with RVR. Now back in NSR  Impella at P-8. CO 3.6 Good waveforms. Positioned confirmed under echo by Dr. Gwenlyn Found earlier today.   LDH 1,025   PCT 2.26  Hgb 10.2 -> 9.2  Swan numbers done personally with RN CVP 9 PA 39/24 (31) PCWP 22 Thermo 5.4/2.6 SVR 1027  Objective:   Weight Range:  Vital Signs:   Temp:  [95.7 F (35.4 C)-100.6 F (38.1 C)] 99.7 F (37.6 C) (05/25 1000) Pulse Rate:  [59-143] 92 (05/25 1000) Resp:  [15-32] 18 (05/25 1000) BP: (56-141)/(23-101) 113/76 (05/25 1000) SpO2:  [91 %-100 %] 94 % (05/25 1000) Arterial Line BP: (64-111)/(44-79) 104/62 (05/25 1000) FiO2 (%):  [40 %] 40 % (05/25 0736) Last BM Date: 11/03/2018  Weight change: Filed Weights   11/14/2018 2125 11/16/18 0619 10/26/2018 0500  Weight: 90.7 kg 83.2 kg 83.6 kg    Intake/Output:   Intake/Output Summary (Last 24 hours) at 11/18/2018 1028 Last data filed at 11/18/2018 1000 Gross per 24 hour  Intake 2474.12 ml  Output 735 ml  Net 1739.12 ml     Physical Exam: General:  Intubated/sedated HEENT: normal + ETT edentuolous  Neck: supple. JVP . Carotids 2+ bilat; no bruits. No lymphadenopathy or thryomegaly appreciated. Cor: PMI nondisplaced. Regular rate & rhythm. No rubs, gallops or murmurs. Lungs: clear Abdomen: soft, nontender, nondistended. No hepatosplenomegaly. No bruits or masses. Good bowel sounds. Extremities: no cyanosis, clubbing, rash, edema  RFA swan and impella. Small hematoma. Foley with frank blood  Neuro: Intubated sedated  Telemetry: Sinus 90-100. Personally reviewed   Labs: Basic Metabolic Panel: Recent Labs  Lab 11/19/2018 2146 11/16/18 0223 10/29/2018 0453  11/03/2018 0620 11/18/2018 1618 11/18/18 0220 11/18/18 0234  NA 136 138 139 137 136 136 136  K 4.1 4.5 5.2* 4.9 4.7 4.6 4.4  CL 104 104 105  --   --  106  --   CO2 20* 15* 17*  --   --  22  --   GLUCOSE 190* 238* 244*  --   --  157*  --   BUN 37* 37* 62*  --   --  85*  --   CREATININE 1.58* 2.38* 2.56*  --   --  3.42*  --   CALCIUM 8.9 9.3 9.3  --   --  8.2*  --   MG  --  2.3  --   --   --   --   --     Liver Function Tests: Recent Labs  Lab 11/16/18 0223  AST 27  ALT 19  ALKPHOS 58  BILITOT 0.6  PROT 7.1  ALBUMIN 3.6   No results for input(s): LIPASE, AMYLASE in the last 168 hours. No results for input(s): AMMONIA in the last 168 hours.  CBC: Recent Labs  Lab 11/11/2018 2146 11/03/2018 0453 10/31/2018 0620 11/08/2018 1618 10/28/2018 2222 11/18/18 0220 11/18/18 0234  WBC 10.0 27.1*  --   --  25.8* 25.9*  --   NEUTROABS 8.9*  --   --   --   --   --   --   HGB 12.4* 12.5* 11.9* 10.2* 10.5* 10.2* 9.2*  HCT  39.0 39.0 35.0* 30.0* 31.6* 30.9* 27.0*  MCV 94.0 93.3  --   --  91.9 91.2  --   PLT 232 248  --   --  241 237  --     Cardiac Enzymes: Recent Labs  Lab 11/16/2018 2146 11/16/18 0223 11/16/18 0718 11/16/18 1417 11/01/2018 0453  TROPONINI 0.34* 0.98* 3.29* 6.46* 10.33*    BNP: BNP (last 3 results) Recent Labs    11/16/18 0223  BNP 1,543.0*    ProBNP (last 3 results) No results for input(s): PROBNP in the last 8760 hours.    Other results:  Imaging: Dg Chest 1 View  Result Date: 11/18/2018 CLINICAL DATA:  Fevers EXAM: CHEST  1 VIEW COMPARISON:  10/27/2018 FINDINGS: Cardiac shadow is stable. Aortic calcifications are again seen. Endotracheal tube and gastric catheter are noted and stable. Impella catheter is noted in place new from the prior study. Swan-Ganz catheter is noted within the right lower lobe from a and inferior approach. Left jugular central line is noted in the mid superior vena cava. Lungs are well aerated bilaterally. Mild vascular congestion is  noted. No focal infiltrate or sizable effusion is seen. IMPRESSION: Tubes and lines as described above. Mild vascular congestion stable from the prior exam. Electronically Signed   By: Inez Catalina M.D.   On: 11/18/2018 02:22   Dg Chest Port 1 View  Result Date: 11/02/2018 CLINICAL DATA:  Orogastric tube placement EXAM: PORTABLE CHEST 1 VIEW COMPARISON:  Nov 17, 2018 study obtained earlier in the day and April 15, 2018 FINDINGS: Orogastric tube tip is in the proximal stomach with the side port at the gastroesophageal junction. There is an endotracheal tube present with tip 6.3 cm above the carina. Central catheter tip is in the superior vena cava. No pneumothorax. Lungs appear somewhat hyperexpanded without evident edema or consolidation. There is chronic interstitial thickening throughout the lungs. Scattered areas of scarring noted. Heart is upper normal in size with pulmonary vascularity within normal limits. There is aortic atherosclerosis. There is aortic atherosclerosis. There is degenerative change in each shoulder. IMPRESSION: Tube and catheter positions as described without pneumothorax. Note that the orogastric tube side port is at the gastroesophageal junction. Advise advancing orogastric tube 5-6 cm. Lungs hyperexpanded with areas of interstitial thickening and scarring, likely reflective of chronic inflammatory type change. No frank edema or consolidation. Heart upper normal in size. Aortic Atherosclerosis (ICD10-I70.0). Electronically Signed   By: Lowella Grip III M.D.   On: 10/31/2018 10:32   Dg Chest Port 1 View  Result Date: 11/21/2018 CLINICAL DATA:  73 year old male with history of cardiogenic shock. EXAM: PORTABLE CHEST 1 VIEW COMPARISON:  Chest x-ray 11/02/2018. FINDINGS: Ill-defined opacity in the right lung base which may reflect an area of ground-glass attenuation airspace consolidation. Diffuse interstitial prominence noted throughout remaining portions of the lungs  bilaterally, asymmetrically distributed (relative sparing of the left upper lobe). Diffuse peribronchial cuffing. No pleural effusions. No evidence of pulmonary edema. Decreased pulmonary vascular markings in the left upper lobe. Heart size is upper limits of normal. Upper mediastinal contours are within normal limits. Aortic atherosclerosis. IMPRESSION: 1. Ill-defined opacity in the right lung base concerning for developing right middle and/or right lower lobe pneumonia. 2. Diffuse peribronchial cuffing and interstitial prominence which may suggest an associated bronchitis. 3. Decreased pulmonary vascular markings in the left upper lobe. If there is any clinical concern for pulmonary embolism, further evaluation with PE protocol CT scan should be considered. 4. Aortic atherosclerosis. Electronically Signed  By: Vinnie Langton M.D.   On: 10/30/2018 06:07      Medications:     Scheduled Medications:  arformoterol  15 mcg Nebulization BID   aspirin  81 mg Oral Daily   atorvastatin  80 mg Oral q1800   budesonide (PULMICORT) nebulizer solution  0.5 mg Nebulization BID   chlorhexidine gluconate (MEDLINE KIT)  15 mL Mouth Rinse BID   Chlorhexidine Gluconate Cloth  6 each Topical Daily   loratadine  10 mg Oral Daily   mouth rinse  15 mL Mouth Rinse 10 times per day   methylPREDNISolone (SOLU-MEDROL) injection  40 mg Intravenous Q12H   pantoprazole (PROTONIX) IV  40 mg Intravenous Q12H   sodium chloride flush  10-40 mL Intracatheter Q12H   sodium chloride flush  3 mL Intravenous Q12H   sodium chloride flush  3 mL Intravenous Q12H     Infusions:  sodium chloride 10 mL/hr at 11/18/18 0000   amiodarone (CORDARONE) infusion 60 mg/hr (11/18/18 0800)   ceFEPime (MAXIPIME) IV Stopped (11/18/18 0546)   famotidine (PEPCID) IV Stopped (10/26/2018 2201)   fentaNYL infusion INTRAVENOUS 40 mcg/hr (11/18/18 1000)   impella catheter heparin 50 unit/mL in dextrose 5%     heparin 500  Units/hr (11/18/18 1000)   midazolam 0.5 mg/hr (11/18/18 1000)   norepinephrine (LEVOPHED) Adult infusion 35 mcg/min (11/18/18 1000)   sodium chloride     vasopressin (PITRESSIN) infusion - *FOR SHOCK* 0.03 Units/min (11/18/18 1000)     PRN Medications:  sodium chloride, acetaminophen, fentaNYL, ipratropium-albuterol, midazolam, midazolam, nitroGLYCERIN, ondansetron (ZOFRAN) IV, sennosides, sodium chloride flush, sodium chloride flush   Assessment/plan:   1. Cardiogenic shock: Due to acute NSTEMI/ischemic cardiomyopathy. Impella placed 5/24 flow.   - Echo with EF 20-25%. Swan in place in femoral vein.  - On NE 35 at vasopressin 0.03. developed PAF with dobutamine so stopped - Co-ox 60%  - Swan numbers pretty good. Filling pressures up a little but with stable oxygenation and AKI will not push lasix today. Can give 80 x 1 later today as needed or in am  - Continue Impella at P8. Follow daily LDH.  - Can wean NE gently as MAP tolerates.  2. CAD: NSTEMI, occluded distal LCx but culprit is likely 95% pLAD by cath 5/24 (no intervention yet).  TnI to 10.  - Continue heparin gtt (with Impella).  Watch hematuria - Atorvastatin 80 daily, ASA 81 daily. No b-blocker with shock  - Will need to return when renal function stabilizes for PCI to pLAD. Likely start DAPT soon.  3. AKI:  - Creatinine up to 1.6 -> 2.5 -> 3.4, likely ATN due to cardiogenic shock.  But with LDH > 1,000 may also have component of hemolysis from Impella. Will need to trend - Will need to follow carefully, hopefully will improve with increased cardiac output. May need CVVHD.  4. Acute hypoxic respiratory failure due to #1 - Intubated - Has history of severe COPD.  However, suspect current episode is cardiac in origin.  5. Paroxysmal Atrial fibrillation with RVR - new this admission after starting dobutamine. No in NSR. - on IV amio at 60 & heparin   6. ID  - WBC 25.6k (in setting of MI and steroids). PCT 2.26 - On  broad spectrum abx - CXR ok. Cx pending 7. Acute blood loss anemia - procedural/hemtoma +/- hemolysis - keep active T&S   CRITICAL CARE Performed by: Glori Bickers  Total critical care time: 40 minutes  Critical care time  was exclusive of separately billable procedures and treating other patients.  Critical care was necessary to treat or prevent imminent or life-threatening deterioration.  Critical care was time spent personally by me (independent of midlevel providers or residents) on the following activities: development of treatment plan with patient and/or surrogate as well as nursing, discussions with consultants, evaluation of patient's response to treatment, examination of patient, obtaining history from patient or surrogate, ordering and performing treatments and interventions, ordering and review of laboratory studies, ordering and review of radiographic studies, pulse oximetry and re-evaluation of patient's condition.   Length of Stay: 2   Glori Bickers MD 11/18/2018, 10:28 AM  Advanced Heart Failure Team Pager [phone] (M-F; 7a - 4p)  Please contact [facility] for night-coverage after hours (4p -7a ) and weekends on [email or web address]

## 2018-11-18 NOTE — Progress Notes (Signed)
Pharmacy Antibiotic Note  Eric Spencer is a 73 y.o. male admitted on 11/14/2018 with UTI.  Pharmacy has been consulted for cefepime dosing.  WBC 25.9, LA 9>1.5, temp up to 100.4. UA 5/25 showing nitrites, mod leukocytes, rare bacteria, 11-20 squamous cells. Scr 3.42 (in AKI; CrCl 23 mL/min).   Plan: Cefepime 2 g IV every 24 hours  Monitor renal fx, cx results, and clinical pic  Height: 6\' 2"  (188 cm) Weight: 184 lb 4.9 oz (83.6 kg) IBW/kg (Calculated) : 82.2  Temp (24hrs), Avg:98.9 F (37.2 C), Min:95.7 F (35.4 C), Max:100.4 F (38 C)  Recent Labs  Lab 11/14/2018 2146 11/16/18 0223 11/16/18 0234 11/16/18 0531 11/08/2018 0453 10/26/2018 1616 10/31/2018 2222 11/18/18 0220  WBC 10.0  --   --   --  27.1*  --  25.8* 25.9*  CREATININE 1.58* 2.38*  --   --  2.56*  --   --  3.42*  LATICACIDVEN  --   --  8.2* 9.0* 3.7* 3.6*  --  1.5    Estimated Creatinine Clearance: 23 mL/min (A) (by C-G formula based on SCr of 3.42 mg/dL (H)).    Allergies  Allergen Reactions  . Penicillins     Childhood allergy- unaware of reaction    Antimicrobials this admission: Cefepime 5/25 >>   Dose adjustments this admission: N/A  Microbiology results: 5/25 BCx: ordered 5/23 BCx: NGTD 5/25 UCx: sent  5/22 COVID: neg  Thank you for allowing pharmacy to be a part of this patient's care.  Sherron Monday, PharmD, BCCCP Clinical Pharmacist  Pager: [phone] Phone: [phone] 11/18/2018 3:51 AM

## 2018-11-18 NOTE — Progress Notes (Signed)
Wife called and gave update on patient, inform patient is stable at this time.

## 2018-11-19 ENCOUNTER — Encounter (HOSPITAL_COMMUNITY): Payer: Self-pay | Admitting: Cardiovascular Disease

## 2018-11-19 ENCOUNTER — Inpatient Hospital Stay (HOSPITAL_COMMUNITY): Payer: Medicare Other

## 2018-11-19 DIAGNOSIS — N170 Acute kidney failure with tubular necrosis: Secondary | ICD-10-CM

## 2018-11-19 DIAGNOSIS — I255 Ischemic cardiomyopathy: Secondary | ICD-10-CM

## 2018-11-19 LAB — POCT I-STAT EG7
Acid-base deficit: 6 mmol/L — ABNORMAL HIGH (ref 0.0–2.0)
Acid-base deficit: 6 mmol/L — ABNORMAL HIGH (ref 0.0–2.0)
Bicarbonate: 20.6 mmol/L (ref 20.0–28.0)
Bicarbonate: 20.5 mmol/L (ref 20.0–28.0)
Calcium, Ion: 1.23 mmol/L (ref 1.15–1.40)
Calcium, Ion: 1.23 mmol/L (ref 1.15–1.40)
HCT: 33 % — ABNORMAL LOW (ref 39.0–52.0)
HCT: 33 % — ABNORMAL LOW (ref 39.0–52.0)
Hemoglobin: 11.2 g/dL — ABNORMAL LOW (ref 13.0–17.0)
Hemoglobin: 11.2 g/dL — ABNORMAL LOW (ref 13.0–17.0)
O2 Saturation: 62 %
O2 Saturation: 66 %
Potassium: 4.6 mmol/L (ref 3.5–5.1)
Potassium: 4.6 mmol/L (ref 3.5–5.1)
Sodium: 137 mmol/L (ref 135–145)
Sodium: 137 mmol/L (ref 135–145)
TCO2: 22 mmol/L (ref 22–32)
TCO2: 22 mmol/L (ref 22–32)
pCO2, Ven: 42.4 mmHg — ABNORMAL LOW (ref 44.0–60.0)
pCO2, Ven: 41.6 mmHg — ABNORMAL LOW (ref 44.0–60.0)
pH, Ven: 7.293 (ref 7.250–7.430)
pH, Ven: 7.3 (ref 7.250–7.430)
pO2, Ven: 36 mmHg (ref 32.0–45.0)
pO2, Ven: 38 mmHg (ref 32.0–45.0)

## 2018-11-19 LAB — COOXEMETRY PANEL
Carboxyhemoglobin: 1.6 % — ABNORMAL HIGH (ref 0.5–1.5)
Methemoglobin: 2.3 % — ABNORMAL HIGH (ref 0.0–1.5)
O2 Saturation: 66 %
Total hemoglobin: 8.5 g/dL — ABNORMAL LOW (ref 12.0–16.0)

## 2018-11-19 LAB — POCT I-STAT 7, (LYTES, BLD GAS, ICA,H+H)
Acid-base deficit: 5 mmol/L — ABNORMAL HIGH (ref 0.0–2.0)
Bicarbonate: 20.1 mmol/L (ref 20.0–28.0)
Calcium, Ion: 1.19 mmol/L (ref 1.15–1.40)
HCT: 33 % — ABNORMAL LOW (ref 39.0–52.0)
Hemoglobin: 11.2 g/dL — ABNORMAL LOW (ref 13.0–17.0)
O2 Saturation: 100 %
Potassium: 4.5 mmol/L (ref 3.5–5.1)
Sodium: 137 mmol/L (ref 135–145)
TCO2: 21 mmol/L — ABNORMAL LOW (ref 22–32)
pCO2 arterial: 36.3 mmHg (ref 32.0–48.0)
pH, Arterial: 7.352 (ref 7.350–7.450)
pO2, Arterial: 423 mmHg — ABNORMAL HIGH (ref 83.0–108.0)

## 2018-11-19 LAB — CBC
HCT: 25.1 % — ABNORMAL LOW (ref 39.0–52.0)
Hemoglobin: 8.3 g/dL — ABNORMAL LOW (ref 13.0–17.0)
MCH: 30.3 pg (ref 26.0–34.0)
MCHC: 33.1 g/dL (ref 30.0–36.0)
MCV: 91.6 fL (ref 80.0–100.0)
Platelets: 116 10*3/uL — ABNORMAL LOW (ref 150–400)
RBC: 2.74 MIL/uL — ABNORMAL LOW (ref 4.22–5.81)
RDW: 16.1 % — ABNORMAL HIGH (ref 11.5–15.5)
WBC: 20.4 10*3/uL — ABNORMAL HIGH (ref 4.0–10.5)
nRBC: 0.4 % — ABNORMAL HIGH (ref 0.0–0.2)

## 2018-11-19 LAB — POCT ACTIVATED CLOTTING TIME
Activated Clotting Time: 158 seconds
Activated Clotting Time: 169 seconds
Activated Clotting Time: 180 seconds
Activated Clotting Time: 180 seconds
Activated Clotting Time: 180 seconds
Activated Clotting Time: 197 seconds
Activated Clotting Time: 191 seconds
Activated Clotting Time: 191 seconds
Activated Clotting Time: 180 seconds
Activated Clotting Time: 197 seconds
Activated Clotting Time: 202 seconds
Activated Clotting Time: 230 seconds
Activated Clotting Time: 235 seconds
Activated Clotting Time: 87 seconds
Activated Clotting Time: 180 seconds

## 2018-11-19 LAB — BASIC METABOLIC PANEL
Anion gap: 10 (ref 5–15)
BUN: 94 mg/dL — ABNORMAL HIGH (ref 8–23)
CO2: 19 mmol/L — ABNORMAL LOW (ref 22–32)
Calcium: 8.3 mg/dL — ABNORMAL LOW (ref 8.9–10.3)
Chloride: 107 mmol/L (ref 98–111)
Creatinine, Ser: 3.29 mg/dL — ABNORMAL HIGH (ref 0.61–1.24)
GFR calc Af Amer: 21 mL/min — ABNORMAL LOW (ref >60)
GFR calc non Af Amer: 18 mL/min — ABNORMAL LOW (ref >60)
Glucose, Bld: 206 mg/dL — ABNORMAL HIGH (ref 70–99)
Potassium: 4.9 mmol/L (ref 3.5–5.1)
Sodium: 136 mmol/L (ref 135–145)

## 2018-11-19 LAB — HEPATIC FUNCTION PANEL
ALT: 25 U/L (ref 0–44)
AST: 102 U/L — ABNORMAL HIGH (ref 15–41)
Albumin: 2.3 g/dL — ABNORMAL LOW (ref 3.5–5.0)
Alkaline Phosphatase: 34 U/L — ABNORMAL LOW (ref 38–126)
Bilirubin, Direct: 0.7 mg/dL — ABNORMAL HIGH (ref 0.0–0.2)
Indirect Bilirubin: 1.1 mg/dL — ABNORMAL HIGH (ref 0.3–0.9)
Total Bilirubin: 1.8 mg/dL — ABNORMAL HIGH (ref 0.3–1.2)
Total Protein: 4.8 g/dL — ABNORMAL LOW (ref 6.5–8.1)

## 2018-11-19 LAB — URINE CULTURE: Culture: NO GROWTH

## 2018-11-19 LAB — PROCALCITONIN: Procalcitonin: 2.82 ng/mL

## 2018-11-19 LAB — LEGIONELLA PNEUMOPHILA SEROGP 1 UR AG: L. pneumophila Serogp 1 Ur Ag: POSITIVE — AB

## 2018-11-19 LAB — GLUCOSE, CAPILLARY
Glucose-Capillary: 177 mg/dL — ABNORMAL HIGH (ref 70–99)
Glucose-Capillary: 176 mg/dL — ABNORMAL HIGH (ref 70–99)
Glucose-Capillary: 188 mg/dL — ABNORMAL HIGH (ref 70–99)
Glucose-Capillary: 182 mg/dL — ABNORMAL HIGH (ref 70–99)
Glucose-Capillary: 165 mg/dL — ABNORMAL HIGH (ref 70–99)

## 2018-11-19 LAB — TROPONIN I: Troponin I: 10.23 ng/mL (ref <0.03)

## 2018-11-19 LAB — MRSA PCR SCREENING: MRSA by PCR: NEGATIVE

## 2018-11-19 LAB — ECHOCARDIOGRAM LIMITED
Height: 74 in
Weight: 2927.71 oz

## 2018-11-19 LAB — LACTATE DEHYDROGENASE: LDH: 1208 U/L — ABNORMAL HIGH (ref 98–192)

## 2018-11-19 LAB — PHOSPHORUS: Phosphorus: 5 mg/dL — ABNORMAL HIGH (ref 2.5–4.6)

## 2018-11-19 LAB — LACTIC ACID, PLASMA: Lactic Acid, Venous: 1.1 mmol/L (ref 0.5–1.9)

## 2018-11-19 LAB — PROTIME-INR
INR: 1.5 — ABNORMAL HIGH (ref 0.8–1.2)
Prothrombin Time: 17.6 seconds — ABNORMAL HIGH (ref 11.4–15.2)

## 2018-11-19 LAB — MAGNESIUM: Magnesium: 2.5 mg/dL — ABNORMAL HIGH (ref 1.7–2.4)

## 2018-11-19 MED ORDER — INSULIN ASPART 100 UNIT/ML ~~LOC~~ SOLN
0.0000 [IU] | SUBCUTANEOUS | Status: DC
  Administered 2018-11-19 – 2018-11-21 (×17): 3 [IU] via SUBCUTANEOUS
  Administered 2018-11-22: 04:00:00 5 [IU] via SUBCUTANEOUS
  Administered 2018-11-22 (×3): 3 [IU] via SUBCUTANEOUS
  Administered 2018-11-22: 5 [IU] via SUBCUTANEOUS
  Administered 2018-11-23 (×5): 3 [IU] via SUBCUTANEOUS

## 2018-11-19 MED ORDER — FUROSEMIDE 10 MG/ML IJ SOLN
80.0000 mg | Freq: Once | INTRAMUSCULAR | Status: AC
  Administered 2018-11-19: 80 mg via INTRAVENOUS
  Filled 2018-11-19: qty 8

## 2018-11-19 MED ORDER — VITAL AF 1.2 CAL PO LIQD
1000.0000 mL | ORAL | Status: DC
  Administered 2018-11-19 – 2018-11-21 (×4): 1000 mL

## 2018-11-19 MED ORDER — SODIUM CHLORIDE 0.9 % IV SOLN
500.0000 mg | INTRAVENOUS | Status: DC
  Administered 2018-11-19 – 2018-11-23 (×4): 500 mg via INTRAVENOUS
  Filled 2018-11-19 (×6): qty 500

## 2018-11-19 MED ORDER — "THROMBI-PAD 3""X3"" EX PADS"
1.0000 | MEDICATED_PAD | Freq: Once | CUTANEOUS | Status: AC | PRN
  Administered 2018-11-20: 1 via TOPICAL
  Filled 2018-11-19 (×2): qty 1

## 2018-11-19 MED ORDER — PRO-STAT SUGAR FREE PO LIQD
30.0000 mL | Freq: Every day | ORAL | Status: DC
  Administered 2018-11-19 – 2018-11-21 (×3): 30 mL
  Filled 2018-11-19 (×3): qty 30

## 2018-11-19 MED FILL — Perflutren Lipid Microsphere IV Susp 1.1 MG/ML: INTRAVENOUS | Qty: 10 | Status: AC

## 2018-11-19 NOTE — Progress Notes (Addendum)
Patient ID: Eric Spencer, male   DOB: 23-Oct-1945, 73 y.o.   MRN: [id number]    Advanced Heart Failure Rounding Note   Subjective:    Intubated but awake.  Pressor requirement coming down, now on norepinephrine 5 and vasopressin 0.03.  MAP stable. Creatinine elevated but fairly stable today at 3.29.   Started on stress-dose steroids and cefepime with fever, possible PNA.  This morning, afebrile with WBCs 20.  Cultures negative.  PCT 2.82.   On IV amio for PAF with RVR. Now back in NSR  Impella at P-8. Flow 3.5.  Good waveforms.    LDH 1,025  => 1208. Urine is not bloody.  Hgb 10.2 -> 9.2 -> 8.3, plts 237 -> 116.   Swan numbers CVP 13 PA 44/24 Thermo 2.1 Co-ox 66%  Objective:   Weight Range:  Vital Signs:   Temp:  [98.1 F (36.7 C)-99.9 F (37.7 C)] 98.4 F (36.9 C) (05/26 0700) Pulse Rate:  [64-96] 68 (05/26 0700) Resp:  [17-21] 18 (05/26 0700) BP: (87-118)/(62-85) 102/79 (05/26 0700) SpO2:  [93 %-98 %] 97 % (05/26 0700) Arterial Line BP: (90-119)/(60-73) 97/65 (05/26 0700) FiO2 (%):  [40 %] 40 % (05/26 0358) Weight:  [83 kg] 83 kg (05/26 0400) Last BM Date: 11/16/2018  Weight change: Filed Weights   11/16/18 0619 10/31/2018 0500 11/19/18 0400  Weight: 83.2 kg 83.6 kg 83 kg    Intake/Output:   Intake/Output Summary (Last 24 hours) at 11/19/2018 0736 Last data filed at 11/19/2018 0700 Gross per 24 hour  Intake 2639.99 ml  Output 1045 ml  Net 1594.99 ml     Physical Exam: General: NAD, intubated Neck: JVP 10 cm, no thyromegaly or thyroid nodule.  Lungs: Decreased BS dependently.  CV: Nondisplaced PMI.  Heart regular S1/S2, no S3/S4, no murmur.  No peripheral edema.   Abdomen: Soft, nontender, no hepatosplenomegaly, no distention.  Skin: Intact without lesions or rashes.  Neurologic: Awake, follows commands  Psych: Normal affect. Extremities: No clubbing or cyanosis.  HEENT: Normal.   Telemetry: NSR 80s. Personally reviewed   Labs: Basic Metabolic  Panel: Recent Labs  Lab 11/07/2018 2146 11/16/18 0223 11/10/2018 0453  11/21/2018 1145 11/06/2018 1618 11/18/18 0220 11/18/18 0234 11/19/18 0458  NA 136 138 139   < > 137 136 136 136 136  K 4.1 4.5 5.2*   < > 4.6 4.7 4.6 4.4 4.9  CL 104 104 105  --   --   --  106  --  107  CO2 20* 15* 17*  --   --   --  22  --  19*  GLUCOSE 190* 238* 244*  --   --   --  157*  --  206*  BUN 37* 37* 62*  --   --   --  85*  --  94*  CREATININE 1.58* 2.38* 2.56*  --   --   --  3.42*  --  3.29*  CALCIUM 8.9 9.3 9.3  --   --   --  8.2*  --  8.3*  MG  --  2.3  --   --   --   --   --   --  2.5*  PHOS  --   --   --   --   --   --   --   --  5.0*   < > = values in this interval not displayed.    Liver Function Tests: Recent Labs  Lab 11/16/18  2778 11/19/18 0458  AST 27 102*  ALT 19 25  ALKPHOS 58 34*  BILITOT 0.6 1.8*  PROT 7.1 4.8*  ALBUMIN 3.6 2.3*   No results for input(s): LIPASE, AMYLASE in the last 168 hours. No results for input(s): AMMONIA in the last 168 hours.  CBC: Recent Labs  Lab 11/02/2018 2146 11/05/2018 0453  11/24/2018 1618 11/16/2018 2222 11/18/18 0220 11/18/18 0234 11/19/18 0458  WBC 10.0 27.1*  --   --  25.8* 25.9*  --  20.4*  NEUTROABS 8.9*  --   --   --   --   --   --   --   HGB 12.4* 12.5*   < > 10.2* 10.5* 10.2* 9.2* 8.3*  HCT 39.0 39.0   < > 30.0* 31.6* 30.9* 27.0* 25.1*  MCV 94.0 93.3  --   --  91.9 91.2  --  91.6  PLT 232 248  --   --  241 237  --  116*   < > = values in this interval not displayed.    Cardiac Enzymes: Recent Labs  Lab 11/16/18 0223 11/16/18 0718 11/16/18 1417 11/08/2018 0453 11/19/18 0458  TROPONINI 0.98* 3.29* 6.46* 10.33* 10.23*    BNP: BNP (last 3 results) Recent Labs    11/16/18 0223  BNP 1,543.0*    ProBNP (last 3 results) No results for input(s): PROBNP in the last 8760 hours.    Other results:  Imaging: Dg Chest 1 View  Result Date: 11/18/2018 CLINICAL DATA:  Fevers EXAM: CHEST  1 VIEW COMPARISON:  11/04/2018 FINDINGS:  Cardiac shadow is stable. Aortic calcifications are again seen. Endotracheal tube and gastric catheter are noted and stable. Impella catheter is noted in place new from the prior study. Swan-Ganz catheter is noted within the right lower lobe from a and inferior approach. Left jugular central line is noted in the mid superior vena cava. Lungs are well aerated bilaterally. Mild vascular congestion is noted. No focal infiltrate or sizable effusion is seen. IMPRESSION: Tubes and lines as described above. Mild vascular congestion stable from the prior exam. Electronically Signed   By: Inez Catalina M.D.   On: 11/18/2018 02:22   Dg Chest Port 1 View  Result Date: 11/09/2018 CLINICAL DATA:  Orogastric tube placement EXAM: PORTABLE CHEST 1 VIEW COMPARISON:  Nov 17, 2018 study obtained earlier in the day and April 15, 2018 FINDINGS: Orogastric tube tip is in the proximal stomach with the side port at the gastroesophageal junction. There is an endotracheal tube present with tip 6.3 cm above the carina. Central catheter tip is in the superior vena cava. No pneumothorax. Lungs appear somewhat hyperexpanded without evident edema or consolidation. There is chronic interstitial thickening throughout the lungs. Scattered areas of scarring noted. Heart is upper normal in size with pulmonary vascularity within normal limits. There is aortic atherosclerosis. There is aortic atherosclerosis. There is degenerative change in each shoulder. IMPRESSION: Tube and catheter positions as described without pneumothorax. Note that the orogastric tube side port is at the gastroesophageal junction. Advise advancing orogastric tube 5-6 cm. Lungs hyperexpanded with areas of interstitial thickening and scarring, likely reflective of chronic inflammatory type change. No frank edema or consolidation. Heart upper normal in size. Aortic Atherosclerosis (ICD10-I70.0). Electronically Signed   By: Lowella Grip III M.D.   On: 10/28/2018 10:32      Medications:     Scheduled Medications:  arformoterol  15 mcg Nebulization BID   aspirin  81 mg Oral Daily   atorvastatin  80 mg Oral q1800   budesonide (PULMICORT) nebulizer solution  0.5 mg Nebulization BID   chlorhexidine gluconate (MEDLINE KIT)  15 mL Mouth Rinse BID   Chlorhexidine Gluconate Cloth  6 each Topical Daily   furosemide  80 mg Intravenous Once   loratadine  10 mg Oral Daily   mouth rinse  15 mL Mouth Rinse 10 times per day   methylPREDNISolone (SOLU-MEDROL) injection  40 mg Intravenous Q12H   pantoprazole (PROTONIX) IV  40 mg Intravenous Q12H   sodium chloride flush  10-40 mL Intracatheter Q12H   sodium chloride flush  3 mL Intravenous Q12H   sodium chloride flush  3 mL Intravenous Q12H    Infusions:  sodium chloride 10 mL/hr at 11/19/18 0700   amiodarone (CORDARONE) infusion 30 mg/hr (11/19/18 0700)   ceFEPime (MAXIPIME) IV Stopped (11/19/18 0431)   famotidine (PEPCID) IV Stopped (11/18/18 2230)   fentaNYL infusion INTRAVENOUS 50 mcg/hr (11/19/18 0700)   impella catheter heparin 50 unit/mL in dextrose 5%     heparin 1,200 Units/hr (11/19/18 0700)   midazolam 0.5 mg/hr (11/19/18 0700)   norepinephrine (LEVOPHED) Adult infusion 5 mcg/min (11/19/18 0700)   sodium chloride     vasopressin (PITRESSIN) infusion - *FOR SHOCK* 0.03 Units/min (11/19/18 0700)    PRN Medications: sodium chloride, acetaminophen, fentaNYL, ipratropium-albuterol, midazolam, midazolam, nitroGLYCERIN, ondansetron (ZOFRAN) IV, sennosides, sodium chloride flush, sodium chloride flush   Assessment/plan:   1. Shock: Primarily cardiogenic but also concern for component of septic shock with elevated PCT, fever, possible PNA.  Had acute NSTEMI/ischemic cardiomyopathy. Impella placed 5/24, at P8 with good flow.  Echo with EF 20-25%. Swan in place in femoral vein. On NE 5 (has been weaned down) and vasopressin 0.03. CI 2.1 this morning on swan with co-ox 66%.  CVP 13.   - Will give 1 dose Lasix 80 mg IV.  - I am concerned that he has some hemolysis from Impella with rising LDH, falling hgb and plts.  Urine is concentrated.  I will decrease speed on Impella to P7 and check position under echo later this morning => device visualized under echo, at 3.7 cm which is adequate, no position change today.   - Will see if we can stop vasopressin today.  2. CAD: NSTEMI, occluded distal LCx but culprit is likely 95% pLAD by cath 5/24 (no intervention yet).  TnI to 10.  - Continue heparin gtt (with Impella).  - Atorvastatin 80 daily, ASA 81 daily. No b-blocker with shock  - Will need to return when renal function stabilizes for PCI to pLAD. Likely start DAPT soon (watch plts and hgb).  3. AKI: Creatinine up to 1.6 -> 2.5 -> 3.4 -> 3.29, likely ATN due to cardiogenic shock.  But with LDH > 1,000 and rising may also have component of hemolysis from Impella.  - As above, will assess Impella position today and decrease speed to P7.  - Will ask renal to see, may end up needing CVVH.  However, currently making urine.  Will give a dose of Lasix 80 mg IV x 1.  4. Acute hypoxic respiratory failure due to #1: Intubated.  Has history of severe COPD.  However, suspect current episode is cardiac in origin.  5. Paroxysmal Atrial fibrillation with RVR: New this admission after starting dobutamine. Now in NSR. - Continue IV amiodarone and heparin gtt.  6. ID: WBCs 20K, PCT 2.82.  He is on cefepime for possible PNA, so far cultures negative.  - Continue cefepime per CCM.  7. Acute blood loss anemia: Procedural/hematoma +/- hemolysis - keep active T&S 8. F/E/N: Start tube feeds.    CRITICAL CARE Performed by: Loralie Champagne  Total critical care time: 40 minutes  Critical care time was exclusive of separately billable procedures and treating other patients.  Critical care was necessary to treat or prevent imminent or life-threatening deterioration.  Critical care was time spent  personally by me (independent of midlevel providers or residents) on the following activities: development of treatment plan with patient and/or surrogate as well as nursing, discussions with consultants, evaluation of patient's response to treatment, examination of patient, obtaining history from patient or surrogate, ordering and performing treatments and interventions, ordering and review of laboratory studies, ordering and review of radiographic studies, pulse oximetry and re-evaluation of patient's condition.   Length of Stay: 3   Loralie Champagne MD 11/19/2018, 7:36 AM  Advanced Heart Failure Team Pager [phone] (M-F; 7a - 4p)  Please contact [facility] for night-coverage after hours (4p -7a ) and weekends on [email or web address]

## 2018-11-19 NOTE — Progress Notes (Signed)
  Echocardiogram 2D Echocardiogram limited has been performed.  Tye Savoy 11/19/2018, 9:46 AM

## 2018-11-19 NOTE — Consult Note (Signed)
Reason for Consult: AKI Referring Physician:  Aundra Dubin, MD  Eric Spencer is an 73 y.o. male.  HPI: Eric Spencer has a PMH significant for COPD, HTN, and DJD who presented to [facility] on 11/20/2018 with worsening shortness of breath.  He also noted some chest pain prior to arrival at [facility].  Workup revealed pulmonary infiltrates on CXR and an elevated troponin of 0.24.  Cardiology was consulted and ECG revealed changes concerning for STEMI and was transferred to [facility] where he was noted to be in respiratory distress and had an episode of NSVT.  He was placed on Bipap and started on solumedrol and empiric antibiotics.  He became progressively more short of breath and developed some hypotension and transferred to the ICU.  His troponin also continued to climb and it was felt that he was developing cardiogenic shock.  Bedside ECHO revealed and EF of 20%.  He was started on pressors and ultimately underwent intubation and left hear cath/Impella insertion for ischemic cardiomyopathy and cardiogenic shock on 11/11/2018.  Cardiac cath revealed mid RCA lesion of 60%, Mid Cx to Dist Cx of 100% stenosis, and prox LAD 95% stenosed.  We were consulted after the development of AKI following the above presentation.  The trend in Scr is seen below.  Trend in Creatinine: Creatinine, Ser  Date/Time Value Ref Range Status  11/19/2018 04:58 AM 3.29 (H) 0.61 - 1.24 mg/dL Final  11/18/2018 02:20 AM 3.42 (H) 0.61 - 1.24 mg/dL Final  11/06/2018 04:53 AM 2.56 (H) 0.61 - 1.24 mg/dL Final  11/16/2018 02:23 AM 2.38 (H) 0.61 - 1.24 mg/dL Final  11/24/2018 09:46 PM 1.58 (H) 0.61 - 1.24 mg/dL Final  04/15/2018 11:10 AM 1.29 0.40 - 1.50 mg/dL Final  10/18/2017 01:47 PM 1.46 (H) 0.61 - 1.24 mg/dL Final  05/23/2017 12:40 PM 1.10 0.70 - 1.25 mg/dL Final  10/26/2016 10:37 AM 1.20 0.70 - 1.25 mg/dL Final  10/16/2016 10:40 AM 1.15 0.70 - 1.25 mg/dL Final  01/13/2016 03:44 PM 1.29 (H) 0.70 - 1.25 mg/dL Final  09/15/2015 08:37 AM 1.08 0.61 - 1.24  mg/dL Final    PMH:   Past Medical History:  Diagnosis Date  . Arthritis   . Asthma   . COPD (chronic obstructive pulmonary disease) ([place])   . Hypertension     PSH:   Past Surgical History:  Procedure Laterality Date  . ADENOIDECTOMY    . HERNIA REPAIR    . RIGHT/LEFT HEART CATH AND CORONARY/GRAFT ANGIOGRAPHY N/A 11/18/2018   Procedure: RIGHT/LEFT HEART CATH AND CORONARY/GRAFT ANGIOGRAPHY;  Surgeon: Lorretta Harp, MD;  Location: [place];  Service: Cardiovascular;  Laterality: N/A;  . TONSILLECTOMY    . VENTRICULAR ASSIST DEVICE INSERTION N/A 10/28/2018   Procedure: VENTRICULAR ASSIST DEVICE INSERTION;  Surgeon: Lorretta Harp, MD;  Location: [place] [facility];  Service: Cardiovascular;  Laterality: N/A;    Allergies:  Allergies  Allergen Reactions  . Penicillins     Childhood allergy- unaware of reaction    Medications:   Prior to Admission medications   Medication Sig Start Date End Date Taking? Authorizing Provider  albuterol (PROVENTIL) (2.5 MG/3ML) 0.083% nebulizer solution Take 3 mLs (2.5 mg total) by nebulization every 6 (six) hours as needed for wheezing or shortness of breath. 11/05/18   Tanda Rockers, MD  amLODipine (NORVASC) 10 MG tablet TAKE 1 TABLET BY MOUTH ONCE DAILY. 08/29/18   Susy Frizzle, MD  arformoterol (BROVANA) 15 MCG/2ML NEBU Take 2 mLs (15 mcg  total) by nebulization 2 (two) times daily. 03/18/18   Tanda Rockers, MD  bisoprolol (ZEBETA) 5 MG tablet Take 1 tablet (5 mg total) by mouth daily. 11/09/17   Tanda Rockers, MD  budesonide (PULMICORT) 0.25 MG/2ML nebulizer solution One twice daily with brovana 08/26/18   Tanda Rockers, MD  cetirizine (ZYRTEC) 10 MG tablet Take 1 tablet (10 mg total) by mouth daily. 09/02/18   Valentina Shaggy, MD  clotrimazole (MYCELEX) 10 MG troche Take 1 tablet (10 mg total) by mouth 4 (four) times daily as needed. 11/05/18   Tanda Rockers, MD  dextromethorphan-guaiFENesin (MUCINEX DM) 30-600 MG 12hr  tablet Take 1 tablet by mouth 2 (two) times daily.    [provider]  famotidine (PEPCID) 20 MG tablet TAKE ONE TABLET BY MOUTH AT BEDTIME. 11/11/18   Tanda Rockers, MD  ibuprofen (ADVIL,MOTRIN) 200 MG tablet Take 200 mg by mouth every 6 (six) hours as needed.    [provider]  montelukast (SINGULAIR) 10 MG tablet TAKE 1 TABLET BY MOUTH ONCE DAILY. 03/12/18   Tanda Rockers, MD  pantoprazole (PROTONIX) 40 MG tablet TAKE ONE TABLET BY MOUTH DAILY 30 TO 60 MINUTES BEFORE FIRST MEAL OF THE DAY. 10/18/18   Tanda Rockers, MD  predniSONE (DELTASONE) 10 MG tablet Two with bfast daily until better,  Then one daily x 5 days and stop 08/26/18   Tanda Rockers, MD  Respiratory Therapy Supplies (FLUTTER) DEVI 1 Device by Does not apply route as needed. 10/25/17   Tanda Rockers, MD  Revefenacin (YUPELRI) 175 MCG/3ML SOLN Inhale 3 mLs into the lungs daily. 03/18/18   Tanda Rockers, MD  Roflumilast (DALIRESP) 250 MCG TABS Take 250 mcg by mouth daily. 07/24/18   Parrett, Fonnie Mu, NP  triamcinolone (NASACORT) 55 MCG/ACT AERO nasal inhaler Place 2 sprays into the nose daily.    [provider]  triamterene-hydrochlorothiazide Bradd Burner) 37.5-25 MG tablet One daily as needed for leg swelling 04/15/18   Tanda Rockers, MD  VENTOLIN HFA 108 (90 Base) MCG/ACT inhaler INHALE 1 TO 2 PUFFS INTO LUNGS EVERY 4 HOURS AS NEEDED. 10/18/18   Tanda Rockers, MD    Inpatient medications: . arformoterol  15 mcg Nebulization BID  . aspirin  81 mg Oral Daily  . atorvastatin  80 mg Oral q1800  . budesonide (PULMICORT) nebulizer solution  0.5 mg Nebulization BID  . chlorhexidine gluconate (MEDLINE KIT)  15 mL Mouth Rinse BID  . Chlorhexidine Gluconate Cloth  6 each Topical Daily  . feeding supplement (PRO-STAT SUGAR FREE 64)  30 mL Per Tube Daily  . insulin aspart  0-15 Units Subcutaneous Q4H  . loratadine  10 mg Oral Daily  . mouth rinse  15 mL Mouth Rinse 10 times per day  . methylPREDNISolone  (SOLU-MEDROL) injection  40 mg Intravenous Q12H  . pantoprazole (PROTONIX) IV  40 mg Intravenous Q12H  . sodium chloride flush  10-40 mL Intracatheter Q12H  . sodium chloride flush  3 mL Intravenous Q12H  . sodium chloride flush  3 mL Intravenous Q12H    Discontinued Meds:   Medications Discontinued During This Encounter  Medication Reason  . heparin bolus via infusion 4,000 Units   . heparin ADULT infusion 100 units/mL (25000 units/27m sodium chloride 0.45%)   . ipratropium-albuterol (DUONEB) 0.5-2.5 (3) MG/3ML nebulizer solution 3 mL   . doxycycline (VIBRA-TABS) tablet 100 mg   . methylPREDNISolone sodium succinate (SOLU-MEDROL) 125 mg/2 mL injection  60 mg   . predniSONE (DELTASONE) tablet 40 mg   . albuterol (PROVENTIL) (2.5 MG/3ML) 0.083% nebulizer solution 2.5 mg   . famotidine (PEPCID) tablet 20 mg   . ipratropium-albuterol (DUONEB) 0.5-2.5 (3) MG/3ML nebulizer solution 3 mL   . famotidine (PEPCID) tablet 40 mg   . famotidine (PEPCID) tablet 20 mg   . DOBUTamine (DOBUTREX) infusion 4000 mcg/mL   . fentaNYL (SUBLIMAZE) 100 MCG/2ML injection Returned to ADS  . midazolam (VERSED) 2 MG/2ML injection Returned to ADS  . Chlorhexidine Gluconate Cloth 2 % PADS 6 each Duplicate  . Chlorhexidine Gluconate Cloth 2 % PADS 6 each Duplicate  . iohexol (OMNIPAQUE) 350 MG/ML injection Patient Discharge  . midazolam (VERSED) injection Patient Discharge  . Heparin (Porcine) in NaCl 1000-0.9 UT/500ML-% SOLN Patient Discharge  . heparin injection Patient Discharge  . lidocaine (PF) (XYLOCAINE) 1 % injection Patient Discharge  . heparin ADULT infusion 100 units/mL (25000 units/271m sodium chloride 0.45%)   . Heparin (Porcine) in NaCl 1000-0.9 UT/500ML-% SOLN Patient Discharge  . acetaminophen (TYLENOL) tablet 6287mg Duplicate  . ondansetron (ZOFRAN) injection 4 mg Duplicate  . aspirin EC tablet 81 mg Duplicate  . sodium chloride flush (NS) 0.9 % injection 3 mL Patient Transfer  . 0.9 %   sodium chloride infusion Patient Transfer  . aspirin chewable tablet 81 mg Patient Transfer  . 0.9 %  sodium chloride infusion Patient Transfer  . norepinephrine (LEVOPHED) 482min 25050mremix infusion   . pantoprazole (PROTONIX) EC tablet 40 mg   . 0.9 %  sodium chloride infusion   . cloNIDine (CATAPRES) tablet 0.1 mg Entry Error  . amiodarone (NEXTERONE) IV bolus only 150 mg/100 mL   . DOBUTamine (DOBUTREX) infusion 4000 mcg/mL   . phenylephrine (NEOSYNEPHRINE) 10-0.9 MG/250ML-% infusion   . multivitamin with minerals tablet 1 tablet   . feeding supplement (ENSURE ENLIVE) (ENSURE ENLIVE) liquid 237 mL   . fentaNYL (SUBLIMAZE) injection 25 mcg   . milrinone (PRIMACOR) 20 MG/100 ML (0.2 mg/mL) infusion   . acetaminophen (TYLENOL) tablet 650 mg   . amiodarone (NEXTERONE) 1.8 mg/mL load via infusion 150 mg   . amiodarone (CORDARONE) 450 mg in dextrose 5 % 250 mL infusion   . amiodarone (CORDARONE) 450 mg in dextrose 5 % 250 mL infusion   . amiodarone (CORDARONE) 450 mg in dextrose 5 % 250 mL infusion   . amiodarone (CORDARONE) 450 mg in dextrose 5 % 250 mL infusion   . amiodarone (CORDARONE) 450 mg in dextrose 5 % 250 mL infusion     Social History:  reports that he quit smoking about 3 years ago. His smoking use included cigarettes. He has a 72.00 pack-year smoking history. He has never used smokeless tobacco. He reports that he does not drink alcohol or use drugs.  Family History:   Family History  Problem Relation Age of Onset  . Hypertension Mother   . Arthritis Father   . Stroke Father   . Diabetes Maternal Grandmother   . Allergic rhinitis Neg Hx   . Asthma Neg Hx   . Eczema Neg Hx   . Angioedema Neg Hx     Review of systems not obtained due to patient factors. Weight change:   Intake/Output Summary (Last 24 hours) at 11/19/2018 1141 Last data filed at 11/19/2018 1100 Gross per 24 hour  Intake 2486.97 ml  Output 1900 ml  Net 586.97 ml   BP 90/73   Pulse 76   Temp  98.4 F (  36.9 C)   Resp 18   Ht '6\' 2"'  (1.88 m)   Wt 83 kg   SpO2 97%   BMI 23.49 kg/m  Vitals:   11/19/18 0800 11/19/18 0900 11/19/18 1000 11/19/18 1100  BP: 101/78 96/76 102/76 90/73  Pulse: 64 66 78 76  Resp: '18 18 18 18  ' Temp: 98.6 F (37 C) 98.4 F (36.9 C) 98.4 F (36.9 C) 98.4 F (36.9 C)  TempSrc: Core     SpO2: 96% 97% 98% 97%  Weight:      Height:         General appearance: intubated but awake and alert Head: Normocephalic, without obvious abnormality, atraumatic Resp: diminished breath sounds bibasilar Cardio: regular rate and rhythm and no rub GI: soft, non-tender; bowel sounds normal; no masses,  no organomegaly Extremities: extremities normal, atraumatic, no cyanosis or edema  Labs: Basic Metabolic Panel: Recent Labs  Lab 11/07/2018 2146 11/16/18 0223 11/24/2018 0453 11/10/2018 0620 11/09/2018 1145 11/09/2018 1618 11/18/18 0220 11/18/18 0234 11/19/18 0458  NA 136 138 139 137 137 136 136 136 136  K 4.1 4.5 5.2* 4.9 4.6 4.7 4.6 4.4 4.9  CL 104 104 105  --   --   --  106  --  107  CO2 20* 15* 17*  --   --   --  22  --  19*  GLUCOSE 190* 238* 244*  --   --   --  157*  --  206*  BUN 37* 37* 62*  --   --   --  85*  --  94*  CREATININE 1.58* 2.38* 2.56*  --   --   --  3.42*  --  3.29*  ALBUMIN  --  3.6  --   --   --   --   --   --  2.3*  CALCIUM 8.9 9.3 9.3  --   --   --  8.2*  --  8.3*  PHOS  --   --   --   --   --   --   --   --  5.0*   Liver Function Tests: Recent Labs  Lab 11/16/18 0223 11/19/18 0458  AST 27 102*  ALT 19 25  ALKPHOS 58 34*  BILITOT 0.6 1.8*  PROT 7.1 4.8*  ALBUMIN 3.6 2.3*   No results for input(s): LIPASE, AMYLASE in the last 168 hours. No results for input(s): AMMONIA in the last 168 hours. CBC: Recent Labs  Lab 10/26/2018 2146 10/27/2018 0453  10/27/2018 2222 11/18/18 0220 11/18/18 0234 11/19/18 0458  WBC 10.0 27.1*  --  25.8* 25.9*  --  20.4*  NEUTROABS 8.9*  --   --   --   --   --   --   HGB 12.4* 12.5*   < > 10.5* 10.2*  9.2* 8.3*  HCT 39.0 39.0   < > 31.6* 30.9* 27.0* 25.1*  MCV 94.0 93.3  --  91.9 91.2  --  91.6  PLT 232 248  --  241 237  --  116*   < > = values in this interval not displayed.   PT/INR: '@LABRCNTIP' (inr:5) Cardiac Enzymes: ) Recent Labs  Lab 11/16/18 0223 11/16/18 0718 11/16/18 1417 11/21/2018 0453 11/19/18 0458  TROPONINI 0.98* 3.29* 6.46* 10.33* 10.23*   CBG: Recent Labs  Lab 11/18/18 0827  GLUCAP 177*    Iron Studies: No results for input(s): IRON, TIBC, TRANSFERRIN, FERRITIN in the last 168 hours.  Xrays/Other Studies: Dg Chest  1 View  Result Date: 11/18/2018 CLINICAL DATA:  Fevers EXAM: CHEST  1 VIEW COMPARISON:  10/30/2018 FINDINGS: Cardiac shadow is stable. Aortic calcifications are again seen. Endotracheal tube and gastric catheter are noted and stable. Impella catheter is noted in place new from the prior study. Swan-Ganz catheter is noted within the right lower lobe from a and inferior approach. Left jugular central line is noted in the mid superior vena cava. Lungs are well aerated bilaterally. Mild vascular congestion is noted. No focal infiltrate or sizable effusion is seen. IMPRESSION: Tubes and lines as described above. Mild vascular congestion stable from the prior exam. Electronically Signed   By: Inez Catalina M.D.   On: 11/18/2018 02:22     Assessment/Plan: 1.  AKI- presumably due to ischemic ATN in setting of cardiogenic shock, pressors, reduced EF, and IV contrast (although only received 35m).  Thankfully his UOP has picked up and his Cr is slightly improved, however there will likely be staged cardiac cath with interventions in his future.  Continue to follow for now as no indication for HD.  Will need to minimize contrast for any further studies/interventions. 2. Ischemic cardiomyopathy with cardiogenic shock- s/p Impella, on levophed and vasopressin.  Wean as able  3. CAD s/p NSTEMI- multi-vessel dz.  On heparin with Impella, statin, and aspirin.  No  beta-blocker due to cardiogenic shock 4. Elevated LDH- likely hemolysis from Impella per Cardiology 5. Acute hyoxic respiratory failure- intubated and managed by PCCM 6. P. A fib- on amiodarone 7. ABLA s/p procedural hematoma 8. ID- on abx for possible PNA.   JGovernor RooksColadonato 11/19/2018, 11:41 AM

## 2018-11-20 ENCOUNTER — Inpatient Hospital Stay (HOSPITAL_COMMUNITY): Payer: Medicare Other

## 2018-11-20 DIAGNOSIS — I509 Heart failure, unspecified: Secondary | ICD-10-CM

## 2018-11-20 LAB — CBC
HCT: 24.4 % — ABNORMAL LOW (ref 39.0–52.0)
HCT: 26.1 % — ABNORMAL LOW (ref 39.0–52.0)
Hemoglobin: 8.1 g/dL — ABNORMAL LOW (ref 13.0–17.0)
Hemoglobin: 8.4 g/dL — ABNORMAL LOW (ref 13.0–17.0)
MCH: 30.2 pg (ref 26.0–34.0)
MCH: 29.7 pg (ref 26.0–34.0)
MCHC: 33.2 g/dL (ref 30.0–36.0)
MCHC: 32.2 g/dL (ref 30.0–36.0)
MCV: 91 fL (ref 80.0–100.0)
MCV: 92.2 fL (ref 80.0–100.0)
Platelets: 115 10*3/uL — ABNORMAL LOW (ref 150–400)
Platelets: 115 10*3/uL — ABNORMAL LOW (ref 150–400)
RBC: 2.68 MIL/uL — ABNORMAL LOW (ref 4.22–5.81)
RBC: 2.83 MIL/uL — ABNORMAL LOW (ref 4.22–5.81)
RDW: 16.3 % — ABNORMAL HIGH (ref 11.5–15.5)
RDW: 16.5 % — ABNORMAL HIGH (ref 11.5–15.5)
WBC: 23.7 10*3/uL — ABNORMAL HIGH (ref 4.0–10.5)
WBC: 25.1 10*3/uL — ABNORMAL HIGH (ref 4.0–10.5)
nRBC: 2.3 % — ABNORMAL HIGH (ref 0.0–0.2)
nRBC: 3.4 % — ABNORMAL HIGH (ref 0.0–0.2)

## 2018-11-20 LAB — BASIC METABOLIC PANEL
Anion gap: 10 (ref 5–15)
BUN: 100 mg/dL — ABNORMAL HIGH (ref 8–23)
CO2: 20 mmol/L — ABNORMAL LOW (ref 22–32)
Calcium: 8.1 mg/dL — ABNORMAL LOW (ref 8.9–10.3)
Chloride: 108 mmol/L (ref 98–111)
Creatinine, Ser: 3.22 mg/dL — ABNORMAL HIGH (ref 0.61–1.24)
GFR calc Af Amer: 21 mL/min — ABNORMAL LOW (ref >60)
GFR calc non Af Amer: 18 mL/min — ABNORMAL LOW (ref >60)
Glucose, Bld: 192 mg/dL — ABNORMAL HIGH (ref 70–99)
Potassium: 4.5 mmol/L (ref 3.5–5.1)
Sodium: 138 mmol/L (ref 135–145)

## 2018-11-20 LAB — POCT ACTIVATED CLOTTING TIME
Activated Clotting Time: 175 seconds
Activated Clotting Time: 175 seconds
Activated Clotting Time: 180 seconds
Activated Clotting Time: 158 seconds
Activated Clotting Time: 153 seconds
Activated Clotting Time: 153 seconds
Activated Clotting Time: 142 seconds
Activated Clotting Time: 147 seconds
Activated Clotting Time: 147 seconds

## 2018-11-20 LAB — LACTATE DEHYDROGENASE: LDH: 1076 U/L — ABNORMAL HIGH (ref 98–192)

## 2018-11-20 LAB — COOXEMETRY PANEL
Carboxyhemoglobin: 1.6 % — ABNORMAL HIGH (ref 0.5–1.5)
Carboxyhemoglobin: 1.3 % (ref 0.5–1.5)
Methemoglobin: 1.3 % (ref 0.0–1.5)
Methemoglobin: 2 % — ABNORMAL HIGH (ref 0.0–1.5)
O2 Saturation: 69.7 %
O2 Saturation: 61.9 %
Total hemoglobin: 8.4 g/dL — ABNORMAL LOW (ref 12.0–16.0)
Total hemoglobin: 8.7 g/dL — ABNORMAL LOW (ref 12.0–16.0)

## 2018-11-20 LAB — GLUCOSE, CAPILLARY
Glucose-Capillary: 161 mg/dL — ABNORMAL HIGH (ref 70–99)
Glucose-Capillary: 166 mg/dL — ABNORMAL HIGH (ref 70–99)
Glucose-Capillary: 170 mg/dL — ABNORMAL HIGH (ref 70–99)
Glucose-Capillary: 171 mg/dL — ABNORMAL HIGH (ref 70–99)
Glucose-Capillary: 175 mg/dL — ABNORMAL HIGH (ref 70–99)
Glucose-Capillary: 181 mg/dL — ABNORMAL HIGH (ref 70–99)
Glucose-Capillary: 188 mg/dL — ABNORMAL HIGH (ref 70–99)

## 2018-11-20 LAB — ECHOCARDIOGRAM LIMITED
Height: 74 in
Weight: 3089.97 oz

## 2018-11-20 LAB — MAGNESIUM: Magnesium: 2.7 mg/dL — ABNORMAL HIGH (ref 1.7–2.4)

## 2018-11-20 LAB — PHOSPHORUS: Phosphorus: 4.6 mg/dL (ref 2.5–4.6)

## 2018-11-20 MED ORDER — SODIUM CHLORIDE 0.9 % IV SOLN
INTRAVENOUS | Status: DC | PRN
  Administered 2018-11-20: 14:00:00 250 mL via INTRAVENOUS
  Administered 2018-11-21: 04:00:00 500 mL via INTRAVENOUS

## 2018-11-20 MED ORDER — AMIODARONE IV BOLUS ONLY 150 MG/100ML
150.0000 mg | Freq: Once | INTRAVENOUS | Status: AC
  Administered 2018-11-20: 12:00:00 150 mg via INTRAVENOUS

## 2018-11-20 MED ORDER — SODIUM CHLORIDE 0.9 % IV BOLUS
250.0000 mL | Freq: Once | INTRAVENOUS | Status: AC
  Administered 2018-11-20: 16:00:00 250 mL via INTRAVENOUS

## 2018-11-20 MED ORDER — AMIODARONE HCL IN DEXTROSE 360-4.14 MG/200ML-% IV SOLN
30.0000 mg/h | INTRAVENOUS | Status: DC
  Administered 2018-11-20 – 2018-11-23 (×7): 30 mg/h via INTRAVENOUS
  Filled 2018-11-20 (×8): qty 200

## 2018-11-20 MED ORDER — DEXTROSE 5 % IV SOLN
30.0000 mg/h | INTRAVENOUS | Status: DC
  Filled 2018-11-20 (×2): qty 9

## 2018-11-20 MED ORDER — DEXTROSE 5 % IV SOLN
60.0000 mg/h | INTRAVENOUS | Status: DC
  Filled 2018-11-20: qty 9

## 2018-11-20 MED ORDER — HEPARIN (PORCINE) 25000 UT/250ML-% IV SOLN
500.0000 [IU]/h | INTRAVENOUS | Status: DC
  Administered 2018-11-21: 1200 [IU]/h via INTRAVENOUS
  Filled 2018-11-20 (×2): qty 250

## 2018-11-20 MED ORDER — FUROSEMIDE 10 MG/ML IJ SOLN
80.0000 mg | Freq: Once | INTRAMUSCULAR | Status: AC
  Administered 2018-11-20: 80 mg via INTRAVENOUS
  Filled 2018-11-20: qty 8

## 2018-11-20 MED ORDER — DEXTROSE 5 % IV SOLN
30.0000 mg/h | INTRAVENOUS | Status: DC
  Filled 2018-11-20: qty 9

## 2018-11-20 NOTE — Progress Notes (Addendum)
Patient ID: Eric Spencer, male   DOB: [birth year], 73 y.o.   MRN: [id number]    Advanced Heart Failure Rounding Note   Subjective:    Intubated but awake.  He is now off vasopressin, on norepinephrine 10.  MAP now upper 70s. Creatinine elevated but fairly stable today at 3.22.   Started on stress-dose steroids and cefepime with fever, possible PNA.  This morning, afebrile with WBCs 23.7.  Urine Legionella antigen positive, antibiotics transitioned to azithromycin IV.    On IV amio for PAF with RVR. Now back in NSR  Impella at P-7. Flow 3.4.  Good waveforms.    LDH 1,025  => 1208 => 1076. Urine is not bloody.  Hgb 10.2 -> 9.2 -> 8.3 -> 8.1, plts 237 -> 116 -> 115.   Swan numbers CVP 9 PA 37/19 Thermo 3 Co-ox 70%  Objective:   Weight Range:  Vital Signs:   Temp:  [97.9 F (36.6 C)-98.8 F (37.1 C)] 98.2 F (36.8 C) (05/27 0715) Pulse Rate:  [57-89] 75 (05/27 0715) Resp:  [15-21] 18 (05/27 0715) BP: (87-106)/(58-78) 96/72 (05/27 0700) SpO2:  [95 %-99 %] 98 % (05/27 0715) Arterial Line BP: (83-132)/(52-67) 108/60 (05/27 0715) FiO2 (%):  [40 %] 40 % (05/27 0400) Weight:  [87.6 kg] 87.6 kg (05/27 0500) Last BM Date: 11/12/2018  Weight change: Filed Weights   11/11/2018 0500 11/19/18 0400 11/20/18 0500  Weight: 83.6 kg 83 kg 87.6 kg    Intake/Output:   Intake/Output Summary (Last 24 hours) at 11/20/2018 0737 Last data filed at 11/20/2018 0700 Gross per 24 hour  Intake 3100.73 ml  Output 2710 ml  Net 390.73 ml     Physical Exam: General: Intubated, awake Neck: JVP 8-9 cm, no thyromegaly or thyroid nodule.  Lungs: Decreased BS at bases bilaterally.  CV: Nondisplaced PMI.  Heart regular S1/S2, no S3/S4, no murmur.  No peripheral edema.   Abdomen: Soft, nontender, no hepatosplenomegaly, no distention.  Skin: Intact without lesions or rashes.  Neurologic: Awake, follows commands Extremities: No clubbing or cyanosis.  HEENT: Normal.   Telemetry: NSR 80s.  Personally reviewed   Labs: Basic Metabolic Panel: Recent Labs  Lab 11/16/18 0223 11/16/2018 0453  10/31/2018 1618 11/18/18 0220 11/18/18 0234 11/19/18 0458 11/20/18 0330  NA 138 139   < > 136 136 136 136 138  K 4.5 5.2*   < > 4.7 4.6 4.4 4.9 4.5  CL 104 105  --   --  106  --  107 108  CO2 15* 17*  --   --  22  --  19* 20*  GLUCOSE 238* 244*  --   --  157*  --  206* 192*  BUN 37* 62*  --   --  85*  --  94* 100*  CREATININE 2.38* 2.56*  --   --  3.42*  --  3.29* 3.22*  CALCIUM 9.3 9.3  --   --  8.2*  --  8.3* 8.1*  MG 2.3  --   --   --   --   --  2.5* 2.7*  PHOS  --   --   --   --   --   --  5.0* 4.6   < > = values in this interval not displayed.    Liver Function Tests: Recent Labs  Lab 11/16/18 0223 11/19/18 0458  AST 27 102*  ALT 19 25  ALKPHOS 58 34*  BILITOT 0.6 1.8*  PROT 7.1 4.8*  ALBUMIN 3.6 2.3*   No results for input(s): LIPASE, AMYLASE in the last 168 hours. No results for input(s): AMMONIA in the last 168 hours.  CBC: Recent Labs  Lab 11/21/2018 2146 11/10/2018 0453  11/01/2018 2222 11/18/18 0220 11/18/18 0234 11/19/18 0458 11/20/18 0330  WBC 10.0 27.1*  --  25.8* 25.9*  --  20.4* 23.7*  NEUTROABS 8.9*  --   --   --   --   --   --   --   HGB 12.4* 12.5*   < > 10.5* 10.2* 9.2* 8.3* 8.1*  HCT 39.0 39.0   < > 31.6* 30.9* 27.0* 25.1* 24.4*  MCV 94.0 93.3  --  91.9 91.2  --  91.6 91.0  PLT 232 248  --  241 237  --  116* 115*   < > = values in this interval not displayed.    Cardiac Enzymes: Recent Labs  Lab 11/16/18 0223 11/16/18 0718 11/16/18 1417 11/08/2018 0453 11/19/18 0458  TROPONINI 0.98* 3.29* 6.46* 10.33* 10.23*    BNP: BNP (last 3 results) Recent Labs    11/16/18 0223  BNP 1,543.0*    ProBNP (last 3 results) No results for input(s): PROBNP in the last 8760 hours.    Other results:  Imaging: No results found.   Medications:     Scheduled Medications: . arformoterol  15 mcg Nebulization BID  . aspirin  81 mg Oral Daily   . atorvastatin  80 mg Oral q1800  . budesonide (PULMICORT) nebulizer solution  0.5 mg Nebulization BID  . chlorhexidine gluconate (MEDLINE KIT)  15 mL Mouth Rinse BID  . Chlorhexidine Gluconate Cloth  6 each Topical Daily  . feeding supplement (PRO-STAT SUGAR FREE 64)  30 mL Per Tube Daily  . furosemide  80 mg Intravenous Once  . insulin aspart  0-15 Units Subcutaneous Q4H  . loratadine  10 mg Oral Daily  . mouth rinse  15 mL Mouth Rinse 10 times per day  . methylPREDNISolone (SOLU-MEDROL) injection  40 mg Intravenous Q12H  . pantoprazole (PROTONIX) IV  40 mg Intravenous Q12H  . sodium chloride flush  10-40 mL Intracatheter Q12H  . sodium chloride flush  3 mL Intravenous Q12H  . sodium chloride flush  3 mL Intravenous Q12H    Infusions: . sodium chloride 10 mL/hr at 11/20/18 0700  . amiodarone (CORDARONE) infusion 30 mg/hr (11/20/18 0700)  . azithromycin Stopped (11/19/18 1516)  . famotidine (PEPCID) IV Stopped (11/19/18 2140)  . feeding supplement (VITAL AF 1.2 CAL) 65 mL/hr at 11/20/18 0408  . fentaNYL infusion INTRAVENOUS 50 mcg/hr (11/20/18 0700)  . impella catheter heparin 50 unit/mL in dextrose 5%    . heparin 900 Units/hr (11/20/18 0700)  . midazolam 0.5 mg/hr (11/20/18 0700)  . norepinephrine (LEVOPHED) Adult infusion 10 mcg/min (11/20/18 0700)  . sodium chloride    . vasopressin (PITRESSIN) infusion - *FOR SHOCK* Stopped (11/19/18 1039)    PRN Medications: sodium chloride, acetaminophen, fentaNYL, ipratropium-albuterol, midazolam, midazolam, nitroGLYCERIN, ondansetron (ZOFRAN) IV, sennosides, sodium chloride flush, sodium chloride flush, Thrombi-Pad   Assessment/plan:   1. Shock: Primarily cardiogenic but also concern for component of septic shock with elevated PCT, fever, PNA.  Had acute NSTEMI/ischemic cardiomyopathy. Impella placed 5/24, at P7 currently with good flow. Echo with EF 20-25%. Swan in place in femoral vein. On NE 10 and off vasopressin. CI 3 this morning  from swan with co-ox 70%.  CVP 9.  - Will give 1 dose Lasix 80 mg IV.  -  Wean down norepinephrine, MAP in 70s.  - I have been concerned that he has some hemolysis from Impella, speed decreased yesterday and good position confirmed by echo. This morning, urine looks ok and LDH is trending down.  Platelets are stable. Will check position via echo today.  2. CAD: NSTEMI, occluded distal LCx but culprit is likely 95% pLAD by cath 5/24 (no intervention yet).  TnI to 10.  - Continue heparin gtt (with Impella).  - Atorvastatin 80 daily, ASA 81 daily. No b-blocker with shock  - Will need to return for PCI when renal function stabilizes for PCI to pLAD. Likely start DAPT soon (watch plts and hgb).  3. AKI: Creatinine up to 1.6 -> 2.5 -> 3.4 -> 3.29 -> 3.29, likely ATN due to cardiogenic shock, but with LDH > 1,000 may also have component of hemolysis from Impella. LDH lower today.  - As above, will assess Impella position today.  - Nephrology following, no acute need for CVVH.   4. Acute hypoxic respiratory failure due to #1: Intubated.  Has history of severe COPD.  However, suspect current episode is cardiac in origin.  - He is on Solumedrol with history of COPD and shock.  - CXR today.  - Vent per CCM, hope to wean soon.  5. Paroxysmal Atrial fibrillation with RVR: New this admission after starting dobutamine. Now in NSR. - Continue IV amiodarone and heparin gtt.  6. ID: WBCs 23.7K, last PCT 2.82.  Afebrile now.  Legionella urine antigen positive, concern for Legionella PNA.  - Continue azithromycin.  7. Acute blood loss anemia: Procedural/hematoma +/- hemolysis - keep active T&S 8. F/E/N: Started tube feeds.    CRITICAL CARE Performed by: Loralie Champagne  Total critical care time: 40 minutes  Critical care time was exclusive of separately billable procedures and treating other patients.  Critical care was necessary to treat or prevent imminent or life-threatening deterioration.  Critical  care was time spent personally by me (independent of midlevel providers or residents) on the following activities: development of treatment plan with patient and/or surrogate as well as nursing, discussions with consultants, evaluation of patient's response to treatment, examination of patient, obtaining history from patient or surrogate, ordering and performing treatments and interventions, ordering and review of laboratory studies, ordering and review of radiographic studies, pulse oximetry and re-evaluation of patient's condition.   Length of Stay: 4   Loralie Champagne MD 11/20/2018, 7:37 AM  Advanced Heart Failure Team Pager [phone] (M-F; 7a - 4p)  Please contact [facility] for night-coverage after hours (4p -7a ) and weekends on [email or web address]  Impella checked under echo, position remains 3.6 cm which is adequate.  No alarms.   Loralie Champagne 11/20/2018 10:15 AM

## 2018-11-20 NOTE — Progress Notes (Signed)
Patient having suction alarms on Impella; CVP 9.  Notified Dr. Shirlee Latch, orders received to turn Impella to P6 and give bolus.  Will continue to monitor.

## 2018-11-20 NOTE — Progress Notes (Signed)
Pt's wife Carney Bern called for update. Pt stable at this time. Carney Bern expressed that she will be having dental procedure today at 1pm, lasting a few hours, unsure if she will be able to talk afterwards.  She expressed that she would prefer updates before her procedure if possible. This will be relayed to day shift RN.

## 2018-11-20 NOTE — Progress Notes (Signed)
ACT 158.  Heparin has been off since 1130 due to bleeding issues at femoral site and radial arterial site.  MD Aundra Dubin paged and MD rounded to bedside.  MD, pharmacist and RN discussed heparin drip and ACT goals.  Heparin drip resumed at 100 units/hr and ACT's to be checked hourly until ACT goal of 160-170 met.  MD Aundra Dubin would like to keep ACT 160-170 vs 160-180 due to issues with patient bleeding.  MD aware arterial line removed and patient still in A-if 110-120's with occasional increases to 130's.

## 2018-11-20 NOTE — Progress Notes (Signed)
250 mL bolus completed, impella increased back to p7 per MD McLean's instructions.

## 2018-11-20 NOTE — Progress Notes (Addendum)
Nutrition Follow-up  DOCUMENTATION CODES:   Not applicable  INTERVENTION:   Continue tube feeding:  -Vital AF 1.2 @ 65 ml/hr via OGT (1560 ml)  -30 ml Prostat once daily   TF provides: 1972 kcals, 132 grams protein, 1265 ml free water. Meets 105% of kcal needs and 100% of protein needs.   NUTRITION DIAGNOSIS:   Increased nutrient needs related to chronic illness(COPD) as evidenced by estimated needs.  Ongoing  GOAL:   Patient will meet greater than or equal to 90% of their needs  Met via TF  MONITOR:   Diet advancement, Weight trends, Labs, I & O's  REASON FOR ASSESSMENT:   Consult COPD Protocol  ASSESSMENT:   73 year old male who presented to the ED on 5/22 with SOB. PMH of COPD, GERD, HTN. Pt admitted with COPD exacerbation, AKI, and NSTEMI.   5/24- intubated, s/p cardiac cath and impella, swan   RD working remotely.  No need for CRRT as Cr improving and UOP increasing. Started on steroids/IV abx for possible PNA. Pt remains on pressors. Plan for PCI when renal function stabilizes. Tolerating tube feeding at goal rate. Will continue with current interventions.   Weight noted to increase from 83.6 kg on 5/24 to 87.6 kg today.   Patient is currently intubated on ventilator support MV: 11.7 L/min Temp (24hrs), Avg:98.2 F (36.8 C), Min:97.9 F (36.6 C), Max:98.6 F (37 C) BP: 93/60 MAP: 68 (A-line)  I/O: +4,213 ml since admit UOP: 2,710 ml x 24 hrs   Drips: amiodarone, levophed Medications: SS novolog, solumedrol Labs: Mg 2.7 (H) CBG 171-181 creatinine 3.22- improving   Diet Order:   Diet Order            Diet NPO time specified  Diet effective now              EDUCATION NEEDS:   Not appropriate for education at this time  Skin:  Skin Assessment: Skin Integrity Issues: Skin Integrity Issues:: Stage I Stage I: sacrum  Last BM:  5/22  Height:   Ht Readings from Last 1 Encounters:  11/03/2018 _0  (1.88 m)    Weight:   Wt Readings  from Last 1 Encounters:  11/20/18 87.6 kg    Ideal Body Weight:  86.4 kg  BMI:  Body mass index is 24.8 kg/m.  Estimated Nutritional Needs:   Kcal:  1887 kcal  Protein:  120-135 grams  Fluid:  >/= 1.8 L/day   Mariana Single RD, LDN Clinical Nutrition Pager # - [phone]

## 2018-11-20 NOTE — Progress Notes (Signed)
Suction alarm on impella.  RN confirmed impella position via centimeter markings and per display screen, does not appear impella has moved.  Cardiac output 3.67 and Cardiac index 1.75, CVP 7-8.  RN decreased impella speed to P6 and alarm resolved.  Impella speed resumed at p7.   MD Shirlee Latch paged and informed of alarm status, new orders received.

## 2018-11-20 NOTE — Progress Notes (Signed)
Patient ID: Patricio Popwell, male   DOB: 11-12-1945, 73 y.o.   MRN: [id number] S: No events overnight O:BP (!) 89/72   Pulse 92   Temp 98.6 F (37 C)   Resp 15   Ht '6\' 2"'  (1.88 m)   Wt 87.6 kg   SpO2 97%   BMI 24.80 kg/m   Intake/Output Summary (Last 24 hours) at 11/20/2018 0934 Last data filed at 11/20/2018 0900 Gross per 24 hour  Intake 2963.57 ml  Output 2490 ml  Net 473.57 ml   Intake/Output: I/O last 3 completed shifts: In: 4187.8 [I.V.:1995; Other:627.8; XT/KW:4097.3; IV Piggyback:520.8] Out: 3150 [Urine:3150]  Intake/Output this shift:  Total I/O In: 35 [Other:35] Out: 135 [Urine:135] Weight change: 4.6 kg Gen: intubated CVS: no rub Resp: decreased BS at bases Abd: benign Ext: trace pretibial edema  Recent Labs  Lab 11/08/2018 2146 11/16/18 0223 11/19/2018 0453  10/28/2018 1145 11/02/2018 1151 11/01/2018 1618 11/18/18 0220 11/18/18 0234 11/19/18 0458 11/20/18 0330  NA 136 138 139   < > 137 137 136 136 136 136 138  K 4.1 4.5 5.2*   < > 4.6 4.6 4.7 4.6 4.4 4.9 4.5  CL 104 104 105  --   --   --   --  106  --  107 108  CO2 20* 15* 17*  --   --   --   --  22  --  19* 20*  GLUCOSE 190* 238* 244*  --   --   --   --  157*  --  206* 192*  BUN 37* 37* 62*  --   --   --   --  85*  --  94* 100*  CREATININE 1.58* 2.38* 2.56*  --   --   --   --  3.42*  --  3.29* 3.22*  ALBUMIN  --  3.6  --   --   --   --   --   --   --  2.3*  --   CALCIUM 8.9 9.3 9.3  --   --   --   --  8.2*  --  8.3* 8.1*  PHOS  --   --   --   --   --   --   --   --   --  5.0* 4.6  AST  --  27  --   --   --   --   --   --   --  102*  --   ALT  --  19  --   --   --   --   --   --   --  25  --    < > = values in this interval not displayed.   Liver Function Tests: Recent Labs  Lab 11/16/18 0223 11/19/18 0458  AST 27 102*  ALT 19 25  ALKPHOS 58 34*  BILITOT 0.6 1.8*  PROT 7.1 4.8*  ALBUMIN 3.6 2.3*   No results for input(s): LIPASE, AMYLASE in the last 168 hours. No results for input(s): AMMONIA  in the last 168 hours. CBC: Recent Labs  Lab 11/22/2018 2146 11/24/2018 0453  11/05/2018 2222 11/18/18 0220 11/18/18 0234 11/19/18 0458 11/20/18 0330  WBC 10.0 27.1*  --  25.8* 25.9*  --  20.4* 23.7*  NEUTROABS 8.9*  --   --   --   --   --   --   --   HGB 12.4* 12.5*   < >  10.5* 10.2* 9.2* 8.3* 8.1*  HCT 39.0 39.0   < > 31.6* 30.9* 27.0* 25.1* 24.4*  MCV 94.0 93.3  --  91.9 91.2  --  91.6 91.0  PLT 232 248  --  241 237  --  116* 115*   < > = values in this interval not displayed.   Cardiac Enzymes: Recent Labs  Lab 11/16/18 0223 11/16/18 0718 11/16/18 1417 11/07/2018 0453 11/19/18 0458  TROPONINI 0.98* 3.29* 6.46* 10.33* 10.23*   CBG: Recent Labs  Lab 11/19/18 1551 11/19/18 1953 11/19/18 2345 11/20/18 0335 11/20/18 0815  GLUCAP 182* 165* 175* 171* 181*    Iron Studies: No results for input(s): IRON, TIBC, TRANSFERRIN, FERRITIN in the last 72 hours. Studies/Results: Dg Chest Port 1 View  Result Date: 11/20/2018 CLINICAL DATA:  CHF EXAM: PORTABLE CHEST 1 VIEW COMPARISON:  11/18/2018 FINDINGS: No significant change in mild diffuse interstitial pulmonary opacity, consistent with edema. No new airspace opacity. Mild cardiomegaly. Unchanged support apparatus including endotracheal tube, left neck vascular catheter, esophagogastric tube, inferior approach pulmonary vascular catheter, and Impella device. IMPRESSION: No significant change in mild diffuse interstitial pulmonary opacity, consistent with edema. No new airspace opacity. Mild cardiomegaly. Unchanged support apparatus including endotracheal tube, left neck vascular catheter, esophagogastric tube, inferior approach pulmonary vascular catheter, and Impella device. Electronically Signed   By: Eddie Candle M.D.   On: 11/20/2018 08:29   . arformoterol  15 mcg Nebulization BID  . aspirin  81 mg Oral Daily  . atorvastatin  80 mg Oral q1800  . budesonide (PULMICORT) nebulizer solution  0.5 mg Nebulization BID  . chlorhexidine  gluconate (MEDLINE KIT)  15 mL Mouth Rinse BID  . Chlorhexidine Gluconate Cloth  6 each Topical Daily  . feeding supplement (PRO-STAT SUGAR FREE 64)  30 mL Per Tube Daily  . insulin aspart  0-15 Units Subcutaneous Q4H  . loratadine  10 mg Oral Daily  . mouth rinse  15 mL Mouth Rinse 10 times per day  . methylPREDNISolone (SOLU-MEDROL) injection  40 mg Intravenous Q12H  . pantoprazole (PROTONIX) IV  40 mg Intravenous Q12H  . sodium chloride flush  10-40 mL Intracatheter Q12H  . sodium chloride flush  3 mL Intravenous Q12H  . sodium chloride flush  3 mL Intravenous Q12H    BMET    Component Value Date/Time   NA 138 11/20/2018 0330   K 4.5 11/20/2018 0330   CL 108 11/20/2018 0330   CO2 20 (L) 11/20/2018 0330   GLUCOSE 192 (H) 11/20/2018 0330   BUN 100 (H) 11/20/2018 0330   CREATININE 3.22 (H) 11/20/2018 0330   CREATININE 1.10 05/23/2017 1240   CALCIUM 8.1 (L) 11/20/2018 0330   GFRNONAA 18 (L) 11/20/2018 0330   GFRNONAA 68 05/23/2017 1240   GFRAA 21 (L) 11/20/2018 0330   GFRAA 79 05/23/2017 1240   CBC    Component Value Date/Time   WBC 23.7 (H) 11/20/2018 0330   RBC 2.68 (L) 11/20/2018 0330   HGB 8.1 (L) 11/20/2018 0330   HCT 24.4 (L) 11/20/2018 0330   PLT 115 (L) 11/20/2018 0330   MCV 91.0 11/20/2018 0330   MCH 30.2 11/20/2018 0330   MCHC 33.2 11/20/2018 0330   RDW 16.3 (H) 11/20/2018 0330   LYMPHSABS 0.8 11/09/2018 2146   MONOABS 0.3 11/20/2018 2146   EOSABS 0.0 10/25/2018 2146   BASOSABS 0.0 11/14/2018 2146    Assessment/Plan: 1.  AKI- presumably due to ischemic ATN in setting of cardiogenic shock, pressors, reduced EF, and  IV contrast (although only received 79m).  Thankfully his UOP has picked up and his Cr is slightly improved, however there will likely be staged cardiac cath with interventions in his future.   1. Cr slowly improving 2. Continue to follow for now as no indication for HD.   3. Will need to minimize contrast for any further  studies/interventions. 2. Ischemic cardiomyopathy with cardiogenic shock- s/p Impella, on levophed and vasopressin.  Wean as able  3. CAD s/p NSTEMI- multi-vessel dz.  On heparin with Impella, statin, and aspirin.  No beta-blocker due to cardiogenic shock 4. Elevated LDH- likely hemolysis from Impella per Cardiology 5. Acute hyoxic respiratory failure- intubated and managed by PCCM 6. P. A fib- on amiodarone 7. ABLA s/p procedural hematoma 8. ID- on abx for possible PNA. JDonetta Potts MD [phone]

## 2018-11-20 NOTE — Progress Notes (Signed)
NAME:  Eric Spencer, MRN:  [id number], DOB:  [birth year], LOS: 4 ADMISSION DATE:  11/21/2018, CONSULTATION DATE:  11/16/18 REFERRING MD:  Dr. Delton SeeNelson, CHIEF COMPLAINT:  SOB   Brief History   73 y/o M, former smoker (quit 3 years ago), [facility] with shortness of breath. Work up concerning for possible AECOPD, NSTEMI, AKI.  Transferred to ICU 5/24 with SOB and concern for cardiogenic shock.   Past Medical History  COPD  HTN  Allergies  Arthritis   Significant Hospital Events   5/23 Admit  5/25 - Pt reports he was sleeping and woke due to nursing being concerned for hypotension.  Transferred to ICU. 5/27 weaning well on vent.   Consults:  PCCM  Procedures:  ETT 5/24 -  CVL 5/24 -  ART line 5/24 > Impella  Significant Diagnostic Tests:    Micro Data:  COVID 5/22 >> negative  BCx2 5/23 >>  RVP 5/24 >>   Antimicrobials:  Doxycycline 5/23 >> 5/24 Cefepime 5/ 25 >>  Interim history/subjective:    Remains in shock requiring Impella device and low dose pressors. Remains on vent, but is weaning well on 8/5. No complaints.   Objective   Blood pressure (!) 89/72, pulse 90, temperature 98.2 F (36.8 C), resp. rate 14, height 6\' 2"  (1.88 m), weight 87.6 kg, SpO2 97 %. PAP: (27-48)/(17-30) 31/19 CVP:  [7 mmHg-18 mmHg] 9 mmHg PCWP:  [15 mmHg-22 mmHg] 15 mmHg CO:  [4.8 L/min-6.2 L/min] 5.8 L/min CI:  [2.3 L/min/m2-3 L/min/m2] 2.8 L/min/m2  Vent Mode: PSV;CPAP FiO2 (%):  [40 %] 40 % Set Rate:  [18 bmp] 18 bmp Vt Set:  [650 mL] 650 mL PEEP:  [5 cmH20] 5 cmH20 Pressure Support:  [8 cmH20] 8 cmH20 Plateau Pressure:  [13 cmH20-18 cmH20] 13 cmH20   Intake/Output Summary (Last 24 hours) at 11/20/2018 1047 Last data filed at 11/20/2018 1000 Gross per 24 hour  Intake 3030.91 ml  Output 2360 ml  Net 670.91 ml   Filed Weights   July 22, 2018 0500 11/19/18 0400 11/20/18 0500  Weight: 83.6 kg 83 kg 87.6 kg    General:  Elderly appearing male on vent Neuro:  Awake,  cooperative, follows  HEENT:  [place]/AT, No JVD appreciable, PERRL Cardiovascular:  Tachy, IRIR. No MRG Lungs:  Clear bilateral breath sounds Abdomen:  Soft, non-distended, non-tender. Musculoskeletal:  No acute deformity or ROM limitation Skin:  Intact, MMM  LABS    PULMONARY Recent Labs  Lab July 22, 2018 0400  July 22, 2018 0620 July 22, 2018 1143 July 22, 2018 1145 July 22, 2018 1151 July 22, 2018 1618 July 22, 2018 1810 11/18/18 0205 11/18/18 0234 11/19/18 0500 11/20/18 0335  PHART 7.363  --  7.341* 7.352  --   --  7.361  --   --  7.422  --   --   PCO2ART 35.3  --  34.7 36.3  --   --  39.1  --   --  30.7*  --   --   PO2ART 85.9  --  77.0* 423.0*  --   --  510.0*  --   --  67.0*  --   --   HCO3 19.6*  --  18.8* 20.1 20.6 20.5 22.1  --   --  19.9*  --   --   TCO2  --    < > 20* 21* 22 22 23   --   --  21*  --   --   O2SAT 95.8  --  95.0 100.0 62.0 66.0 100.0 60.7 59.9 93.0 66.0  69.7   < > = values in this interval not displayed.    CBC Recent Labs  Lab 11/18/18 0220 11/18/18 0234 11/19/18 0458 11/20/18 0330  HGB 10.2* 9.2* 8.3* 8.1*  HCT 30.9* 27.0* 25.1* 24.4*  WBC 25.9*  --  20.4* 23.7*  PLT 237  --  116* 115*    COAGULATION Recent Labs  Lab 11/24/2018 2146 11/19/18 0458  INR 1.1 1.5*    CARDIAC   Recent Labs  Lab 11/16/18 0223 11/16/18 0718 11/16/18 1417 12/11/18 0453 11/19/18 0458  TROPONINI 0.98* 3.29* 6.46* 10.33* 10.23*   No results for input(s): PROBNP in the last 168 hours.   CHEMISTRY Recent Labs  Lab 11/16/18 0223 2018-12-11 0453  12/11/2018 1618 11/18/18 0220 11/18/18 0234 11/19/18 0458 11/20/18 0330  NA 138 139   < > 136 136 136 136 138  K 4.5 5.2*   < > 4.7 4.6 4.4 4.9 4.5  CL 104 105  --   --  106  --  107 108  CO2 15* 17*  --   --  22  --  19* 20*  GLUCOSE 238* 244*  --   --  157*  --  206* 192*  BUN 37* 62*  --   --  85*  --  94* 100*  CREATININE 2.38* 2.56*  --   --  3.42*  --  3.29* 3.22*  CALCIUM 9.3 9.3  --   --  8.2*  --  8.3* 8.1*  MG 2.3  --   --    --   --   --  2.5* 2.7*  PHOS  --   --   --   --   --   --  5.0* 4.6   < > = values in this interval not displayed.   Estimated Creatinine Clearance: 24.5 mL/min (A) (by C-G formula based on SCr of 3.22 mg/dL (H)).   LIVER Recent Labs  Lab 11/03/2018 2146 11/16/18 0223 11/19/18 0458  AST  --  27 102*  ALT  --  19 25  ALKPHOS  --  58 34*  BILITOT  --  0.6 1.8*  PROT  --  7.1 4.8*  ALBUMIN  --  3.6 2.3*  INR 1.1  --  1.5*     INFECTIOUS Recent Labs  Lab 2018-12-11 0453 2018/12/11 1616 11/18/18 0220 11/18/18 0837 11/19/18 0000 11/19/18 0458  LATICACIDVEN 3.7* 3.6* 1.5  --  1.1  --   PROCALCITON 0.42  --   --  2.26  --  2.82     ENDOCRINE CBG (last 3)  Recent Labs    11/19/18 2345 11/20/18 0335 11/20/18 0815  GLUCAP 175* 171* 181*     IMAGING x48h Dg Chest Port 1 View  Result Date: 11/20/2018 CLINICAL DATA:  CHF EXAM: PORTABLE CHEST 1 VIEW COMPARISON:  11/18/2018 FINDINGS: No significant change in mild diffuse interstitial pulmonary opacity, consistent with edema. No new airspace opacity. Mild cardiomegaly. Unchanged support apparatus including endotracheal tube, left neck vascular catheter, esophagogastric tube, inferior approach pulmonary vascular catheter, and Impella device. IMPRESSION: No significant change in mild diffuse interstitial pulmonary opacity, consistent with edema. No new airspace opacity. Mild cardiomegaly. Unchanged support apparatus including endotracheal tube, left neck vascular catheter, esophagogastric tube, inferior approach pulmonary vascular catheter, and Impella device. Electronically Signed   By: Lauralyn Primes M.D.   On: 11/20/2018 08:29     Resolved Hospital Problem list      Assessment & Plan:    Acute  hypoxemic respiratory failure in the setting of CAD and pulmonary edema. Also concern for pneumonia with positive urine legionella  - Full vent support - Wean as tolerated (tol 8/5 well today) - Seems to be nearing extubation if ready  from cardiac standpoint. - VAP bundle - Continue azithro for 7 day course.  - Diuresis as directed by advanced heart failure service.   COPD without acute exacerbation - Continue budesonide/brovana - PRN duoneb - Low threshold to DC IV steroids after 5 day course.  PAF with RVR Cardiogenic shock s/p NSTEMI - Impella, pressors per cardiology.  - Amiodarone - Heparin infusion - Stress dose steroids for possible RAI.   AKI: somewhat improved today.  - nephrology following. So far, no need for RRT.   Best practice:  Diet: As tolerated  Pain/Anxiety/Delirium protocol (if indicated): n/a VAP protocol (if indicated): n/a DVT prophylaxis: heparin gtt  GI prophylaxis: pepcid BID  Glucose control: per primary  Mobility: as tolerated  Code Status: Full Code  Family Communication: Patient updated on plan of care 5/27  Cards managing family communiation Disposition: Per Cardiology.    Joneen Roach, AGACNP-BC [facility] Pulmonary/Critical Care Pager [phone] or [phone]  11/20/2018 11:02 AM   ATTESTATION & SIGNATURE

## 2018-11-20 NOTE — Progress Notes (Signed)
ANTICOAGULATION CONSULT NOTE - Follow Up Consult  Pharmacy Consult for Heparin Indication: chest pain/ACS > now for Impella  Allergies  Allergen Reactions  . Penicillins Other (See Comments)    Did it involve swelling of the face/tongue/throat, SOB, or low BP? unknown Did it involve sudden or severe rash/hives, skin peeling, or any reaction on the inside of your mouth or nose? Unknown Did you need to seek medical attention at a hospital or doctor's office? Unknown When did it last happen? childhood If all above answers are "NO", may proceed with cephalosporin use.   Childhood allergy- unaware of reaction    Patient Measurements: Height: 6\' 2"  (188 cm) Weight: 193 lb 2 oz (87.6 kg) IBW/kg (Calculated) : 82.2 Heparin Dosing Weight: 83.2 kg  Vital Signs: Temp: 98.8 F (37.1 C) (05/27 1115) Temp Source: Core (05/27 0800) BP: 117/55 (05/27 1134) Pulse Rate: 149 (05/27 1134)  Labs: Recent Labs    11/18/18 0220 11/18/18 0234 11/19/18 0458 11/20/18 0330  HGB 10.2* 9.2* 8.3* 8.1*  HCT 30.9* 27.0* 25.1* 24.4*  PLT 237  --  116* 115*  LABPROT  --   --  17.6*  --   INR  --   --  1.5*  --   CREATININE 3.42*  --  3.29* 3.22*  TROPONINI  --   --  10.23*  --     Estimated Creatinine Clearance: 24.5 mL/min (A) (by C-G formula based on SCr of 3.22 mg/dL (H)).  Assessment: 73 yo male on IV heparin  is s/p cath lab where Impella CP was placed.  Heparin running in purge solution - purge flow rate at 17.5 mL/hr (870 units/hr) and systemic heparin at 100 units/hr. He is noted with bleeding from IJ, A line, and impella catheter site. Hgb low but stable, and plts trending down to 115.  -ACT= 158  Goal of Therapy:  ACT 160-180 Heparin level 0.3-0.7 units/ml Monitor platelets by anticoagulation protocol: Yes   Plan:  Continue impella purge solution with heparin at current rate Continue systemic heparin at 100 units/hr for now based on ACT ACT in 1hr per protocol    Lenward Chancellor, PharmD PGY1 Pharmacy Resident 11/20/2018 1:47 PM

## 2018-11-20 NOTE — Progress Notes (Addendum)
Patient experiencing bleeding at arterial line site.  Patient previously had issues bleeding at groin and central line site.  Discussed bleeding with MD Shirlee Latch, MD advised okay to decrease heparin drip to 800 units as patient's 0800 ACT was 180.   Patient back in A-Fib as of 0830, discussed with MD Shirlee Latch.  New amiodarone IV orders received.

## 2018-11-20 NOTE — Progress Notes (Signed)
Patient's Arterial line continues to bleed from site, had RT come to bedside to assess and redress site so thrombi-pad can be placed.  RT concerned about leaking when arterial line is flushed.  Spoke with MD Shirlee Latch and discussed bleeding and leaking at arterial line site.  MD Shirlee Latch advised to pause heparin for 2 hours, apply thrombi-pad and new dressing to arterial line site and monitor.  If arterial line continues to bleed excessively, MD advised okay for RN to remove arterial line.

## 2018-11-20 NOTE — Progress Notes (Addendum)
Patient coughing on ventilator, RT suctioned patient and patient's heart rate increased to 150-160's.  Patient nodded yes when asked if having difficulty breathing.  RT placed patient's ventilator setting back on full support.  RN gave patient PRN fentanyl bolus.   1200 - patient continues to be tachycardic 130-150's as well as patient bleeding excessively from femoral sheath site where impella and swan in place.  Ecchymosis present on right leg extending toward patient's side but site is soft, abdomen and pubic region soft.  Patient denied pain in lower back when asked.  RN held pressure for 20 minutes, new thrombi pad and dressing applied.  Charge RN paged and spoke with MD Shirlee Latch while I was holding pressure, MD aware of patient's bleeding and heart rate,  new orders received.

## 2018-11-21 ENCOUNTER — Inpatient Hospital Stay (HOSPITAL_COMMUNITY): Payer: Medicare Other

## 2018-11-21 DIAGNOSIS — D696 Thrombocytopenia, unspecified: Secondary | ICD-10-CM | POA: Diagnosis not present

## 2018-11-21 DIAGNOSIS — D649 Anemia, unspecified: Secondary | ICD-10-CM | POA: Diagnosis not present

## 2018-11-21 DIAGNOSIS — I5021 Acute systolic (congestive) heart failure: Secondary | ICD-10-CM

## 2018-11-21 DIAGNOSIS — A481 Legionnaires' disease: Secondary | ICD-10-CM

## 2018-11-21 LAB — POCT ACTIVATED CLOTTING TIME
Activated Clotting Time: 147 seconds
Activated Clotting Time: 153 seconds
Activated Clotting Time: 153 seconds
Activated Clotting Time: 153 seconds
Activated Clotting Time: 158 seconds
Activated Clotting Time: 158 seconds
Activated Clotting Time: 175 seconds
Activated Clotting Time: 175 seconds
Activated Clotting Time: 175 seconds
Activated Clotting Time: 175 seconds
Activated Clotting Time: 180 seconds
Activated Clotting Time: 180 seconds
Activated Clotting Time: 180 seconds
Activated Clotting Time: 180 seconds
Activated Clotting Time: 186 seconds

## 2018-11-21 LAB — PHOSPHORUS: Phosphorus: 4.2 mg/dL (ref 2.5–4.6)

## 2018-11-21 LAB — CBC
HCT: 24.5 % — ABNORMAL LOW (ref 39.0–52.0)
HCT: 23.5 % — ABNORMAL LOW (ref 39.0–52.0)
Hemoglobin: 8 g/dL — ABNORMAL LOW (ref 13.0–17.0)
Hemoglobin: 7.4 g/dL — ABNORMAL LOW (ref 13.0–17.0)
MCH: 30.1 pg (ref 26.0–34.0)
MCH: 29.8 pg (ref 26.0–34.0)
MCHC: 32.7 g/dL (ref 30.0–36.0)
MCHC: 31.5 g/dL (ref 30.0–36.0)
MCV: 92.1 fL (ref 80.0–100.0)
MCV: 94.8 fL (ref 80.0–100.0)
Platelets: 107 10*3/uL — ABNORMAL LOW (ref 150–400)
Platelets: 102 10*3/uL — ABNORMAL LOW (ref 150–400)
RBC: 2.66 MIL/uL — ABNORMAL LOW (ref 4.22–5.81)
RBC: 2.48 MIL/uL — ABNORMAL LOW (ref 4.22–5.81)
RDW: 16.9 % — ABNORMAL HIGH (ref 11.5–15.5)
RDW: 17.2 % — ABNORMAL HIGH (ref 11.5–15.5)
WBC: 21.8 10*3/uL — ABNORMAL HIGH (ref 4.0–10.5)
WBC: 25.9 10*3/uL — ABNORMAL HIGH (ref 4.0–10.5)
nRBC: 3.7 % — ABNORMAL HIGH (ref 0.0–0.2)
nRBC: 3.7 % — ABNORMAL HIGH (ref 0.0–0.2)

## 2018-11-21 LAB — GLUCOSE, CAPILLARY
Glucose-Capillary: 184 mg/dL — ABNORMAL HIGH (ref 70–99)
Glucose-Capillary: 191 mg/dL — ABNORMAL HIGH (ref 70–99)
Glucose-Capillary: 169 mg/dL — ABNORMAL HIGH (ref 70–99)
Glucose-Capillary: 194 mg/dL — ABNORMAL HIGH (ref 70–99)
Glucose-Capillary: 153 mg/dL — ABNORMAL HIGH (ref 70–99)

## 2018-11-21 LAB — BASIC METABOLIC PANEL
Anion gap: 11 (ref 5–15)
BUN: 103 mg/dL — ABNORMAL HIGH (ref 8–23)
CO2: 20 mmol/L — ABNORMAL LOW (ref 22–32)
Calcium: 7.7 mg/dL — ABNORMAL LOW (ref 8.9–10.3)
Chloride: 109 mmol/L (ref 98–111)
Creatinine, Ser: 3.23 mg/dL — ABNORMAL HIGH (ref 0.61–1.24)
GFR calc Af Amer: 21 mL/min — ABNORMAL LOW (ref >60)
GFR calc non Af Amer: 18 mL/min — ABNORMAL LOW (ref >60)
Glucose, Bld: 204 mg/dL — ABNORMAL HIGH (ref 70–99)
Potassium: 4.5 mmol/L (ref 3.5–5.1)
Sodium: 140 mmol/L (ref 135–145)

## 2018-11-21 LAB — PREPARE RBC (CROSSMATCH)

## 2018-11-21 LAB — ECHOCARDIOGRAM LIMITED
Height: 74 in
Weight: 2948.87 oz

## 2018-11-21 LAB — CULTURE, BLOOD (ROUTINE X 2)
Culture: NO GROWTH
Culture: NO GROWTH
Special Requests: ADEQUATE
Special Requests: ADEQUATE

## 2018-11-21 LAB — COOXEMETRY PANEL
Carboxyhemoglobin: 1.5 % (ref 0.5–1.5)
Methemoglobin: 1.8 % — ABNORMAL HIGH (ref 0.0–1.5)
O2 Saturation: 74.1 %
Total hemoglobin: 7.7 g/dL — ABNORMAL LOW (ref 12.0–16.0)

## 2018-11-21 LAB — LACTATE DEHYDROGENASE: LDH: 1107 U/L — ABNORMAL HIGH (ref 98–192)

## 2018-11-21 LAB — MAGNESIUM: Magnesium: 2.6 mg/dL — ABNORMAL HIGH (ref 1.7–2.4)

## 2018-11-21 MED ORDER — FUROSEMIDE 10 MG/ML IJ SOLN
40.0000 mg | Freq: Once | INTRAMUSCULAR | Status: AC
  Administered 2018-11-21: 08:00:00 40 mg via INTRAVENOUS
  Filled 2018-11-21: qty 4

## 2018-11-21 MED ORDER — SODIUM CHLORIDE 0.9% IV SOLUTION
Freq: Once | INTRAVENOUS | Status: AC
  Administered 2018-11-22: 01:00:00 via INTRAVENOUS

## 2018-11-21 NOTE — Progress Notes (Addendum)
Patient ID: Eric Spencer, male   DOB: [birth year], 73 y.o.   MRN: [id number]    Advanced Heart Failure Rounding Note   Subjective:    Intubated but awake and will follow commands.  He is now off vasopressin, on norepinephrine 16.  MAP now 70s. BUN/creatinine 103/3.23 (100/3.22 yesterday).    Yesterday, he went back into atrial fibrillation with RVR and pressure dropped with suction events on Impella.  This necessitated a couple of IVF boluses.  Good UOP yesterday but net I/Os positive. He is currently in atrial fibrillation with controlled rate 80s.  Vent weaned for about 3 hrs yesterday but stopped when he went into afib/RVR.   Started on stress-dose steroids and cefepime with fever, possible PNA.  This morning, afebrile with WBCs 21.8.  Urine Legionella antigen positive, antibiotics transitioned to azithromycin IV.    Impella at P-7. Flow 3.3.  Good waveforms.  No recent suction events.   LDH 1,025  => 1208 => 1076 => 1107. Urine is clear.  Hgb 10.2 -> 9.2 -> 8.3 -> 8.1 -> 8, plts 237 -> 116 -> 115 -> 107.   Swan numbers CVP 8-9 PA 30/19 Thermo CI 2.8 Co-ox 74%  Objective:   Weight Range:  Vital Signs:   Temp:  [97.9 F (36.6 C)-99.1 F (37.3 C)] 98.6 F (37 C) (05/28 0700) Pulse Rate:  [58-149] 85 (05/28 0700) Resp:  [14-29] 18 (05/28 0700) BP: (72-122)/(55-92) 98/76 (05/28 0700) SpO2:  [90 %-100 %] 98 % (05/28 0700) Arterial Line BP: (93-127)/(45-89) 116/70 (05/27 1230) FiO2 (%):  [40 %] 40 % (05/28 0449) Weight:  [83.6 kg] 83.6 kg (05/28 0600) Last BM Date: 10/25/2018  Weight change: Filed Weights   11/19/18 0400 11/20/18 0500 11/21/18 0600  Weight: 83 kg 87.6 kg 83.6 kg    Intake/Output:   Intake/Output Summary (Last 24 hours) at 11/21/2018 0742 Last data filed at 11/21/2018 0700 Gross per 24 hour  Intake 4605.36 ml  Output 2335 ml  Net 2270.36 ml     Physical Exam: General: NAD, intubated.  Neck: JVP 8 cm, no thyromegaly or thyroid nodule.  Lungs:  Decreased BS at bases.  CV: Nondisplaced PMI.  Heart irregular S1/S2, no S3/S4, no murmur.  No peripheral edema.   Abdomen: Soft, nontender, no hepatosplenomegaly, no distention.  Skin: Intact without lesions or rashes.  Neurologic: Awake, can follow commands Extremities: No clubbing or cyanosis.  HEENT: Normal.   Telemetry: atrial fibrillation 80s. Personally reviewed   Labs: Basic Metabolic Panel: Recent Labs  Lab 11/16/18 0223 11/14/2018 0453  11/06/2018 1618 11/18/18 0220 11/18/18 0234 11/19/18 0458 11/20/18 0330  NA 138 139   < > 136 136 136 136 138  K 4.5 5.2*   < > 4.7 4.6 4.4 4.9 4.5  CL 104 105  --   --  106  --  107 108  CO2 15* 17*  --   --  22  --  19* 20*  GLUCOSE 238* 244*  --   --  157*  --  206* 192*  BUN 37* 62*  --   --  85*  --  94* 100*  CREATININE 2.38* 2.56*  --   --  3.42*  --  3.29* 3.22*  CALCIUM 9.3 9.3  --   --  8.2*  --  8.3* 8.1*  MG 2.3  --   --   --   --   --  2.5* 2.7*  PHOS  --   --   --   --   --   --  5.0* 4.6   < > = values in this interval not displayed.    Liver Function Tests: Recent Labs  Lab 11/16/18 0223 11/19/18 0458  AST 27 102*  ALT 19 25  ALKPHOS 58 34*  BILITOT 0.6 1.8*  PROT 7.1 4.8*  ALBUMIN 3.6 2.3*   No results for input(s): LIPASE, AMYLASE in the last 168 hours. No results for input(s): AMMONIA in the last 168 hours.  CBC: Recent Labs  Lab 10/31/2018 2146  11/18/18 0220 11/18/18 0234 11/19/18 0458 11/20/18 0330 11/20/18 1335 11/21/18 0343  WBC 10.0   < > 25.9*  --  20.4* 23.7* 25.1* 21.8*  NEUTROABS 8.9*  --   --   --   --   --   --   --   HGB 12.4*   < > 10.2* 9.2* 8.3* 8.1* 8.4* 8.0*  HCT 39.0   < > 30.9* 27.0* 25.1* 24.4* 26.1* 24.5*  MCV 94.0   < > 91.2  --  91.6 91.0 92.2 92.1  PLT 232   < > 237  --  116* 115* 115* 107*   < > = values in this interval not displayed.    Cardiac Enzymes: Recent Labs  Lab 11/16/18 0223 11/16/18 0718 11/16/18 1417 10/28/2018 0453 11/19/18 0458  TROPONINI 0.98*  3.29* 6.46* 10.33* 10.23*    BNP: BNP (last 3 results) Recent Labs    11/16/18 0223  BNP 1,543.0*    ProBNP (last 3 results) No results for input(s): PROBNP in the last 8760 hours.    Other results:  Imaging: Dg Chest Port 1 View  Result Date: 11/20/2018 CLINICAL DATA:  CHF EXAM: PORTABLE CHEST 1 VIEW COMPARISON:  11/18/2018 FINDINGS: No significant change in mild diffuse interstitial pulmonary opacity, consistent with edema. No new airspace opacity. Mild cardiomegaly. Unchanged support apparatus including endotracheal tube, left neck vascular catheter, esophagogastric tube, inferior approach pulmonary vascular catheter, and Impella device. IMPRESSION: No significant change in mild diffuse interstitial pulmonary opacity, consistent with edema. No new airspace opacity. Mild cardiomegaly. Unchanged support apparatus including endotracheal tube, left neck vascular catheter, esophagogastric tube, inferior approach pulmonary vascular catheter, and Impella device. Electronically Signed   By: Eddie Candle M.D.   On: 11/20/2018 08:29     Medications:     Scheduled Medications: . arformoterol  15 mcg Nebulization BID  . aspirin  81 mg Oral Daily  . atorvastatin  80 mg Oral q1800  . budesonide (PULMICORT) nebulizer solution  0.5 mg Nebulization BID  . chlorhexidine gluconate (MEDLINE KIT)  15 mL Mouth Rinse BID  . Chlorhexidine Gluconate Cloth  6 each Topical Daily  . feeding supplement (PRO-STAT SUGAR FREE 64)  30 mL Per Tube Daily  . furosemide  40 mg Intravenous Once  . insulin aspart  0-15 Units Subcutaneous Q4H  . loratadine  10 mg Oral Daily  . mouth rinse  15 mL Mouth Rinse 10 times per day  . methylPREDNISolone (SOLU-MEDROL) injection  40 mg Intravenous Q12H  . pantoprazole (PROTONIX) IV  40 mg Intravenous Q12H  . sodium chloride flush  10-40 mL Intracatheter Q12H  . sodium chloride flush  3 mL Intravenous Q12H  . sodium chloride flush  3 mL Intravenous Q12H    Infusions:  . sodium chloride 10 mL/hr at 11/21/18 0327  . sodium chloride 10 mL/hr at 11/21/18 0700  . amiodarone 30 mg/hr (11/21/18 0700)  . azithromycin Stopped (11/20/18 1427)  . famotidine (PEPCID) IV Stopped (11/20/18 2250)  . feeding supplement (VITAL  AF 1.2 CAL) 65 mL/hr at 11/21/18 0700  . fentaNYL infusion INTRAVENOUS 50 mcg/hr (11/21/18 0700)  . impella catheter heparin 50 unit/mL in dextrose 5%    . heparin 1,200 Units/hr (11/21/18 0700)  . midazolam 0.5 mg/hr (11/21/18 0700)  . norepinephrine (LEVOPHED) Adult infusion 16 mcg/min (11/21/18 0700)  . sodium chloride    . vasopressin (PITRESSIN) infusion - *FOR SHOCK* Stopped (11/19/18 1039)    PRN Medications: sodium chloride, sodium chloride, acetaminophen, fentaNYL, ipratropium-albuterol, midazolam, midazolam, nitroGLYCERIN, ondansetron (ZOFRAN) IV, sennosides, sodium chloride flush, sodium chloride flush   Assessment/plan:   1. Shock: Primarily cardiogenic but also concern for component of septic shock with elevated PCT, fever, PNA.  Had acute NSTEMI/ischemic cardiomyopathy. Impella placed 5/24, at P7 currently with good flow 3.3 L/min. Echo with EF 20-25%. Swan in place in femoral vein. On NE 16 and off vasopressin. CI 2.8 this morning from swan with co-ox 74%.  CVP 8-9. BUN/creatinine unchanged.  - Will give 1 dose Lasix 40 mg IV to aim to keep I/Os fairly even.  - Wean down norepinephrine, MAP in 70s.  Was not able to wean yesterday due to atrial fibrillation with RVR, but rate is controlled today.  Can replace arterial line if needed for closer monitoring.  - I have been concerned that he has some hemolysis from Impella, will lower speed to P6 today. This morning, urine looks clear but LDH mildly higher. Platelets mildly lower. Will check position via echo today.  2. CAD: NSTEMI, occluded distal LCx but culprit is likely 95% pLAD by cath 5/24 (no intervention yet).  TnI to 10.  - Continue heparin gtt (with Impella). Had some bleeding  at groin site yesterday but has resolved.  - Atorvastatin 80 daily, ASA 81 daily. No b-blocker with shock  - Will need to return for PCI when renal function stabilizes for PCI to pLAD. Likely start DAPT soon (watch plts and hgb).  3. AKI: Creatinine up to 1.6 -> 2.5 -> 3.4 -> 3.29 -> 3.29 -> 3.23, likely ATN due to cardiogenic shock, but with LDH > 1,000 may also have component of hemolysis from Impella.  - As above, will assess Impella position today by echo.  - Nephrology following, no acute need for CVVH.   4. Acute hypoxic respiratory failure due to #1: Intubated.  Has history of severe COPD.  However, suspect current episode is cardiac in origin.  - He is on Solumedrol with history of COPD and shock.  - CXR today.  - Vent per CCM, wean again today.  5. Paroxysmal Atrial fibrillation with RVR: New this admission after starting dobutamine. Now in atrial fibrillation with controlled rate. - Continue IV amiodarone and heparin gtt.  6. ID: WBCs 21.8K, last PCT 2.82.  Afebrile now.  Legionella urine antigen positive, concern for Legionella PNA.  - Continue azithromycin.  7. Acute blood loss anemia: Procedural/hematoma +/- hemolysis - keep active T&S 8. F/E/N: Started tube feeds.    CRITICAL CARE Performed by: Loralie Champagne  Total critical care time: 40 minutes  Critical care time was exclusive of separately billable procedures and treating other patients.  Critical care was necessary to treat or prevent imminent or life-threatening deterioration.  Critical care was time spent personally by me (independent of midlevel providers or residents) on the following activities: development of treatment plan with patient and/or surrogate as well as nursing, discussions with consultants, evaluation of patient's response to treatment, examination of patient, obtaining history from patient or surrogate, ordering and performing  treatments and interventions, ordering and review of laboratory studies,  ordering and review of radiographic studies, pulse oximetry and re-evaluation of patient's condition.   Length of Stay: 5   Loralie Champagne MD 11/21/2018, 7:42 AM  Advanced Heart Failure Team Pager [phone] (M-F; 7a - 4p)  Please contact [facility] for night-coverage after hours (4p -7a ) and weekends on [email or web address]  Impella checked under echo, position remains 3.6 cm which is adequate.  No alarms.   Loralie Champagne 11/21/2018 7:42 AM

## 2018-11-21 NOTE — Progress Notes (Signed)
Patient ID: Eric Spencer, male   DOB: 03/30/1946, 73 y.o.   MRN: [id number] S: For ECHO today O:BP (!) 82/63   Pulse 93   Temp 98.8 F (37.1 C)   Resp 17   Ht _0  (1.88 m)   Wt 83.6 kg   SpO2 95%   BMI 23.66 kg/m   Intake/Output Summary (Last 24 hours) at 11/21/2018 0948 Last data filed at 11/21/2018 0900 Gross per 24 hour  Intake 4683.95 ml  Output 2630 ml  Net 2053.95 ml   Intake/Output: I/O last 3 completed shifts: In: 6156.1 [I.V.:2091.2; Other:650.9; OE/UM:3536.1; IV Piggyback:849.8] Out: 3015 [Urine:2970; Emesis/NG output:45]  Intake/Output this shift:  Total I/O In: 306.8 [I.V.:113; Other:33.8; NG/GT:160] Out: 430 [Urine:430] Weight change: -4 kg Gen: intubated and sedated CVS: no rub Resp: scattered rhonchi Abd: benign Ext: tr edema  Recent Labs  Lab 11/10/2018 2146 11/16/18 0223 11/22/2018 0453  10/28/2018 1151 10/25/2018 1618 11/18/18 0220 11/18/18 0234 11/19/18 0458 11/20/18 0330 11/21/18 0343  NA 136 138 139   < > 137 136 136 136 136 138 140  K 4.1 4.5 5.2*   < > 4.6 4.7 4.6 4.4 4.9 4.5 4.5  CL 104 104 105  --   --   --  106  --  107 108 109  CO2 20* 15* 17*  --   --   --  22  --  19* 20* 20*  GLUCOSE 190* 238* 244*  --   --   --  157*  --  206* 192* 204*  BUN 37* 37* 62*  --   --   --  85*  --  94* 100* 103*  CREATININE 1.58* 2.38* 2.56*  --   --   --  3.42*  --  3.29* 3.22* 3.23*  ALBUMIN  --  3.6  --   --   --   --   --   --  2.3*  --   --   CALCIUM 8.9 9.3 9.3  --   --   --  8.2*  --  8.3* 8.1* 7.7*  PHOS  --   --   --   --   --   --   --   --  5.0* 4.6 4.2  AST  --  27  --   --   --   --   --   --  102*  --   --   ALT  --  19  --   --   --   --   --   --  25  --   --    < > = values in this interval not displayed.   Liver Function Tests: Recent Labs  Lab 11/16/18 0223 11/19/18 0458  AST 27 102*  ALT 19 25  ALKPHOS 58 34*  BILITOT 0.6 1.8*  PROT 7.1 4.8*  ALBUMIN 3.6 2.3*   No results for input(s): LIPASE, AMYLASE in the last 168  hours. No results for input(s): AMMONIA in the last 168 hours. CBC: Recent Labs  Lab 10/28/2018 2146  11/18/18 0220  11/19/18 0458 11/20/18 0330 11/20/18 1335 11/21/18 0343  WBC 10.0   < > 25.9*  --  20.4* 23.7* 25.1* 21.8*  NEUTROABS 8.9*  --   --   --   --   --   --   --   HGB 12.4*   < > 10.2*   < > 8.3* 8.1* 8.4* 8.0*  HCT  39.0   < > 30.9*   < > 25.1* 24.4* 26.1* 24.5*  MCV 94.0   < > 91.2  --  91.6 91.0 92.2 92.1  PLT 232   < > 237  --  116* 115* 115* 107*   < > = values in this interval not displayed.   Cardiac Enzymes: Recent Labs  Lab 11/16/18 0223 11/16/18 0718 11/16/18 1417 10/30/2018 0453 11/19/18 0458  TROPONINI 0.98* 3.29* 6.46* 10.33* 10.23*   CBG: Recent Labs  Lab 11/20/18 1612 11/20/18 2011 11/20/18 2340 11/21/18 0352 11/21/18 0757  GLUCAP 161* 170* 166* 184* 191*    Iron Studies: No results for input(s): IRON, TIBC, TRANSFERRIN, FERRITIN in the last 72 hours. Studies/Results: Dg Chest Port 1 View  Result Date: 11/21/2018 CLINICAL DATA:  Intubation EXAM: PORTABLE CHEST 1 VIEW COMPARISON:  11/20/2018 FINDINGS: Endotracheal tube, NG tube, central venous line, Swan-Ganz catheter unchanged. Swan-Ganz catheter in the RIGHT pulmonary artery. Impella device in the ascending aorta and LEFT heart. Fine basilar airspace disease. No focal consolidation. No pneumothorax. IMPRESSION: 1. Stable support apparatus. 2. Fine basilar airspace disease suggests mild pulmonary edema. Electronically Signed   By: Suzy Bouchard M.D.   On: 11/21/2018 08:23   Dg Chest Port 1 View  Result Date: 11/20/2018 CLINICAL DATA:  CHF EXAM: PORTABLE CHEST 1 VIEW COMPARISON:  11/18/2018 FINDINGS: No significant change in mild diffuse interstitial pulmonary opacity, consistent with edema. No new airspace opacity. Mild cardiomegaly. Unchanged support apparatus including endotracheal tube, left neck vascular catheter, esophagogastric tube, inferior approach pulmonary vascular catheter, and  Impella device. IMPRESSION: No significant change in mild diffuse interstitial pulmonary opacity, consistent with edema. No new airspace opacity. Mild cardiomegaly. Unchanged support apparatus including endotracheal tube, left neck vascular catheter, esophagogastric tube, inferior approach pulmonary vascular catheter, and Impella device. Electronically Signed   By: Eddie Candle M.D.   On: 11/20/2018 08:29   . arformoterol  15 mcg Nebulization BID  . aspirin  81 mg Oral Daily  . atorvastatin  80 mg Oral q1800  . budesonide (PULMICORT) nebulizer solution  0.5 mg Nebulization BID  . chlorhexidine gluconate (MEDLINE KIT)  15 mL Mouth Rinse BID  . Chlorhexidine Gluconate Cloth  6 each Topical Daily  . feeding supplement (PRO-STAT SUGAR FREE 64)  30 mL Per Tube Daily  . insulin aspart  0-15 Units Subcutaneous Q4H  . loratadine  10 mg Oral Daily  . mouth rinse  15 mL Mouth Rinse 10 times per day  . methylPREDNISolone (SOLU-MEDROL) injection  40 mg Intravenous Q12H  . pantoprazole (PROTONIX) IV  40 mg Intravenous Q12H  . sodium chloride flush  10-40 mL Intracatheter Q12H  . sodium chloride flush  3 mL Intravenous Q12H  . sodium chloride flush  3 mL Intravenous Q12H    BMET    Component Value Date/Time   NA 140 11/21/2018 0343   K 4.5 11/21/2018 0343   CL 109 11/21/2018 0343   CO2 20 (L) 11/21/2018 0343   GLUCOSE 204 (H) 11/21/2018 0343   BUN 103 (H) 11/21/2018 0343   CREATININE 3.23 (H) 11/21/2018 0343   CREATININE 1.10 05/23/2017 1240   CALCIUM 7.7 (L) 11/21/2018 0343   GFRNONAA 18 (L) 11/21/2018 0343   GFRNONAA 68 05/23/2017 1240   GFRAA 21 (L) 11/21/2018 0343   GFRAA 79 05/23/2017 1240   CBC    Component Value Date/Time   WBC 21.8 (H) 11/21/2018 0343   RBC 2.66 (L) 11/21/2018 0343   HGB 8.0 (L) 11/21/2018  0343   HCT 24.5 (L) 11/21/2018 0343   PLT 107 (L) 11/21/2018 0343   MCV 92.1 11/21/2018 0343   MCH 30.1 11/21/2018 0343   MCHC 32.7 11/21/2018 0343   RDW 16.9 (H)  11/21/2018 0343   LYMPHSABS 0.8 10/28/2018 2146   MONOABS 0.3 10/25/2018 2146   EOSABS 0.0 11/07/2018 2146   BASOSABS 0.0 11/14/2018 2146     Assessment/Plan: 1. AKI- presumably due to ischemic ATN in setting of cardiogenic shock, pressors, reduced EF, and IV contrast (although only received 71m). Thankfully his UOP has picked up and his Cr is slightly improved, however there will likely be staged cardiac cath with interventions in his future.  1. Cr slowly improving 2. Continue to follow for now as no indication for HD.  3. Will need to minimize contrast for any further studies/interventions, however may be sometime before he can undergo another cath since Cr has reached a plateau. 2. Ischemic cardiomyopathy with cardiogenic shock- s/p Impella, on levophed and vasopressin. Wean as able  3. CAD s/p NSTEMI- multi-vessel dz. On heparin with Impella, statin, and aspirin. No beta-blocker due to cardiogenic shock 4. Elevated LDH- likely hemolysis from Impella per Cardiology 5. Acute hyoxic respiratory failure- intubated and managed by PCCM 6. CAP- + legionella- on azithromycin. 7. P. A fib- on amiodarone 8. ABLA s/p procedural hematoma 9. ID- on abx for possible PNA.  JDonetta Potts MD [employer]([phone]

## 2018-11-21 NOTE — Progress Notes (Signed)
PCCM Brief Communication Note.  I spoke with the patient's wife over the phone. I provided daily updates about the patient's ventilator and answered questions regarding the patient's kidney function and previous bleeding. The patient's wife was very appreciative of the update and wishes to pass along her sincerest thanks to all involved in caring for her husband.    Tessie Fass MSN, AGACNP-BC  Pulmonary/Critical Care Medicine [phone] If no answer, [phone] 11/21/2018, 1:42 PM

## 2018-11-21 NOTE — Progress Notes (Signed)
NAME:  Eric Spencer, MRN:  [id number], DOB:  [birth year], LOS: 5 ADMISSION DATE:  11/05/2018, CONSULTATION DATE:  11/16/18 REFERRING MD:  Dr. Delton See, CHIEF COMPLAINT:  SOB   Brief History   73 y/o M, former smoker (quit 3 years ago), admitted 5/22 from [facility] with shortness of breath. Work up concerning for possible AECOPD, NSTEMI, AKI.  Transferred to ICU 5/24 with SOB and concern for cardiogenic shock.   Past Medical History  COPD  HTN  Allergies  Arthritis   Significant Hospital Events   5/23 Admit  5/25 - Pt reports he was sleeping and woke due to nursing being concerned for hypotension.  Transferred to ICU. 5/27 weaning well on vent.   Consults:  PCCM  Procedures:  ETT 5/24 -  CVL 5/24 -  ART line 5/24 > Impella  Significant Diagnostic Tests:   CXR 5/28>bilateral opacities suggestive of pulmonary edema. Impella device, ETT, Swan ganz, NGT unchanged Micro Data:  COVID 5/22 >> negative  BCx2 5/23 >> NGTD UCx 5/25> NGTD   Antimicrobials:  Doxycycline 5/23 >> 5/24 Cefepime 5/ 25 > 5/26 Azithromycin 5/26 >>>   Interim history/subjective:  Improving shock, off vaso, on NE.  Impella remains Labs have not resulted 5/28 AM at time of exam  Weaning vent: 8 / 5   Objective   Blood pressure 100/76, pulse 87, temperature 98.4 F (36.9 C), temperature source Core, resp. rate 13, height 6\' 2"  (1.88 m), weight 83.6 kg, SpO2 97 %. PAP: (22-50)/(7-28) 30/17 CVP:  [4 mmHg-21 mmHg] 5 mmHg PCWP:  [7 mmHg-16 mmHg] 16 mmHg CO:  [3.6 L/min-5.9 L/min] 5.9 L/min CI:  [1.7 L/min/m2-2.8 L/min/m2] 2.8 L/min/m2  Vent Mode: PRVC FiO2 (%):  [40 %] 40 % Set Rate:  [18 bmp] 18 bmp Vt Set:  [650 mL] 650 mL PEEP:  [5 cmH20] 5 cmH20 Pressure Support:  [8 cmH20] 8 cmH20 Plateau Pressure:  [13 cmH20-18 cmH20] 17 cmH20   Intake/Output Summary (Last 24 hours) at 11/21/2018 0813 Last data filed at 11/21/2018 0800 Gross per 24 hour  Intake 4554.8 ml  Output 2510 ml  Net 2044.8 ml    Filed Weights   11/19/18 0400 11/20/18 0500 11/21/18 0600  Weight: 83 kg 87.6 kg 83.6 kg    General:  Elderly, chronically ill appearing M, intubated NAD, in bed  Neuro:  Awake drowsy PERRL follows commands  HEENT:  NCAT ETT secure trachea midline. Anicteric sclera. Pink mmm  Cardiovascular:  IRIR. s1s2 no rgm. No peripheral edema  Lungs:  Clear bilateral breath sounds  Abdomen:  Soft, non-distended, non-tender. Normoactive bowel sounds x4  Musculoskeletal:  No obvious joint abnormality. No cyanosis or clubbing  Skin:  Clean, dry, warm without rash. Scattered ecchymosis BUE   LABS    PULMONARY Recent Labs  Lab 11/30/2018 0400  11-30-2018 0620 11/30/18 1143 2018-11-30 1145 11/30/2018 1151 Nov 30, 2018 1618  11/18/18 0234 11/19/18 0500 11/20/18 0335 11/20/18 1622 11/21/18 0355  PHART 7.363  --  7.341* 7.352  --   --  7.361  --  7.422  --   --   --   --   PCO2ART 35.3  --  34.7 36.3  --   --  39.1  --  30.7*  --   --   --   --   PO2ART 85.9  --  77.0* 423.0*  --   --  510.0*  --  67.0*  --   --   --   --   HCO3  19.6*  --  18.8* 20.1 20.6 20.5 22.1  --  19.9*  --   --   --   --   TCO2  --    < > 20* 21* 22 22 23   --  21*  --   --   --   --   O2SAT 95.8  --  95.0 100.0 62.0 66.0 100.0   < > 93.0 66.0 69.7 61.9 74.1   < > = values in this interval not displayed.    CBC Recent Labs  Lab 11/20/18 0330 11/20/18 1335 11/21/18 0343  HGB 8.1* 8.4* 8.0*  HCT 24.4* 26.1* 24.5*  WBC 23.7* 25.1* 21.8*  PLT 115* 115* 107*    COAGULATION Recent Labs  Lab 11/24/2018 2146 11/19/18 0458  INR 1.1 1.5*    CARDIAC   Recent Labs  Lab 11/16/18 0223 11/16/18 0718 11/16/18 1417 11/10/2018 0453 11/19/18 0458  TROPONINI 0.98* 3.29* 6.46* 10.33* 10.23*   No results for input(s): PROBNP in the last 168 hours.   CHEMISTRY Recent Labs  Lab 11/16/18 0223 10/25/2018 0453  11/18/18 0220 11/18/18 0234 11/19/18 0458 11/20/18 0330 11/21/18 0343  NA 138 139   < > 136 136 136 138 140  K  4.5 5.2*   < > 4.6 4.4 4.9 4.5 4.5  CL 104 105  --  106  --  107 108 109  CO2 15* 17*  --  22  --  19* 20* 20*  GLUCOSE 238* 244*  --  157*  --  206* 192* 204*  BUN 37* 62*  --  85*  --  94* 100* 103*  CREATININE 2.38* 2.56*  --  3.42*  --  3.29* 3.22* 3.23*  CALCIUM 9.3 9.3  --  8.2*  --  8.3* 8.1* 7.7*  MG 2.3  --   --   --   --  2.5* 2.7* 2.6*  PHOS  --   --   --   --   --  5.0* 4.6 4.2   < > = values in this interval not displayed.   Estimated Creatinine Clearance: 24.4 mL/min (A) (by C-G formula based on SCr of 3.23 mg/dL (H)).   LIVER Recent Labs  Lab 10/28/2018 2146 11/16/18 0223 11/19/18 0458  AST  --  27 102*  ALT  --  19 25  ALKPHOS  --  58 34*  BILITOT  --  0.6 1.8*  PROT  --  7.1 4.8*  ALBUMIN  --  3.6 2.3*  INR 1.1  --  1.5*     INFECTIOUS Recent Labs  Lab 11/04/2018 0453 11/04/2018 1616 11/18/18 0220 11/18/18 0837 11/19/18 0000 11/19/18 0458  LATICACIDVEN 3.7* 3.6* 1.5  --  1.1  --   PROCALCITON 0.42  --   --  2.26  --  2.82     ENDOCRINE CBG (last 3)  Recent Labs    11/20/18 2011 11/20/18 2340 11/21/18 0352  GLUCAP 170* 166* 184*     IMAGING x48h Dg Chest Port 1 View  Result Date: 11/20/2018 CLINICAL DATA:  CHF EXAM: PORTABLE CHEST 1 VIEW COMPARISON:  11/18/2018 FINDINGS: No significant change in mild diffuse interstitial pulmonary opacity, consistent with edema. No new airspace opacity. Mild cardiomegaly. Unchanged support apparatus including endotracheal tube, left neck vascular catheter, esophagogastric tube, inferior approach pulmonary vascular catheter, and Impella device. IMPRESSION: No significant change in mild diffuse interstitial pulmonary opacity, consistent with edema. No new airspace opacity. Mild cardiomegaly. Unchanged support apparatus including endotracheal tube,  left neck vascular catheter, esophagogastric tube, inferior approach pulmonary vascular catheter, and Impella device. Electronically Signed   By: Lauralyn Primes M.D.   On:  11/20/2018 08:29   CXR 5/28> bilateral opacities suggestive of pulmonary edema   Resolved Hospital Problem list      Assessment & Plan:   Acute hypoxemic respiratory failure in setting of CAD, pulmonary edema COPD without acute exacerbation  Concern for PNA given + Urine legionella P - Wean vent qAM; weaning well 8 / 5 on  5/28  - Presence of impella device limiting progression to extubation per prior PCCM note. Will page Heart Failure team to discuss.  - VAP bundle - Continue azithromycin for 10 days for legionnaire. Can be changed to levaquin if cutaneous manifestations arise  - Diuresis per heart failure service (will receive lasix 5/28)  - Low threshold to dc IV steroids following 5 day course  - Brovana/budesonide, PRN duoneb  - AM CXR   PAF with RVR Cardiogenic shock s/p NSTEMI - Impella and pressors per heart failure service. Currently off vaso and on NE - Continuing amio and heparin gtt  -Continue tele - On stress dose steroids    AKI, non-oliguric  - nephrology following, no RRT needed at this time  - Trend BMP, UOP   Best practice:  Diet: EN Pain/Anxiety/Delirium protocol (if indicated): fent gtt, PRN fent  VAP protocol (if indicated): VAP prevention bundle  DVT prophylaxis: heparin gtt  GI prophylaxis: pepcid BID  Glucose control: per primary  Mobility: bedrest  Code Status: Full Code  Family Communication: Per cardiology Disposition: Per cardiology    Critical Care Time : 35 minutes   Tessie Fass MSN, AGACNP-BC [place] [facility] [phone] If no answer, [phone] 11/21/2018, 8:13 AM

## 2018-11-21 NOTE — Progress Notes (Signed)
  Echocardiogram 2D Echocardiogram has been performed.  Eric Spencer 11/21/2018, 9:43 AM

## 2018-11-21 NOTE — Progress Notes (Signed)
Discussed patient plan of care with MD Shirlee Latch during rounds.  MD Shirlee Latch discussed weaning levophed per patient's MAP.  Patient has impella in place and MD Shirlee Latch advised a narrow pulse pressure is expected but okay to wean levophed if patient's MAP is above 65 as well as patient's clinical picture and mental status supports that patient has adequate perfusion.

## 2018-11-21 NOTE — Progress Notes (Signed)
Impella Purge Cassette changed per protocol purge cassette to be changed per IV tubing protocol of every 96 hours.

## 2018-11-21 NOTE — Plan of Care (Signed)
  Problem: Nutrition: Goal: Adequate nutrition will be maintained Outcome: Progressing   Problem: Pain Managment: Goal: General experience of comfort will improve Outcome: Progressing   Problem: Cardiovascular: Goal: Ability to achieve and maintain adequate cardiovascular perfusion will improve Outcome: Progressing   Problem: Cardiac: Goal: Ability to achieve and maintain adequate cardiopulmonary perfusion will improve Outcome: Progressing

## 2018-11-21 NOTE — Progress Notes (Signed)
ANTICOAGULATION CONSULT NOTE - Follow Up Consult  Pharmacy Consult for Heparin Indication: chest pain/ACS > now for Impella  Allergies  Allergen Reactions  . Penicillins Other (See Comments)    Did it involve swelling of the face/tongue/throat, SOB, or low BP? unknown Did it involve sudden or severe rash/hives, skin peeling, or any reaction on the inside of your mouth or nose? Unknown Did you need to seek medical attention at a hospital or doctor's office? Unknown When did it last happen? childhood If all above answers are "NO", may proceed with cephalosporin use.   Childhood allergy- unaware of reaction    Patient Measurements: Height: 6\' 2"  (188 cm) Weight: 184 lb 4.9 oz (83.6 kg) IBW/kg (Calculated) : 82.2 Heparin Dosing Weight: 83.2 kg  Vital Signs: Temp: 98.2 F (36.8 C) (05/28 1315) Temp Source: Core (05/28 1200) BP: 112/74 (05/28 1315) Pulse Rate: 87 (05/28 1315)  Labs: Recent Labs    11/19/18 0458 11/20/18 0330 11/20/18 1335 11/21/18 0343  HGB 8.3* 8.1* 8.4* 8.0*  HCT 25.1* 24.4* 26.1* 24.5*  PLT 116* 115* 115* 107*  LABPROT 17.6*  --   --   --   INR 1.5*  --   --   --   CREATININE 3.29* 3.22*  --  3.23*  TROPONINI 10.23*  --   --   --     Estimated Creatinine Clearance: 24.4 mL/min (A) (by C-G formula based on SCr of 3.23 mg/dL (H)).  Assessment: 73 yo male on IV heparin  is s/p cath lab where Impella CP was placed.  Heparin running in purge solution - purge flow rate at 24 mL/hr (1200units/hr) and systemic heparin at 1200 units/hr. Prior noted bleeding has improved. Hgb low but stable, and plts trending down to 107.  -ACT= 180  Goal of Therapy:  ACT 160-180 Heparin level 0.3-0.7 units/ml Monitor platelets by anticoagulation protocol: Yes   Plan:  Continue impella purge solution with heparin at current rate Continue systemic heparin at 1200units/hr for now based on ACT ACT in 4hr per protocol    Lenward Chancellor, PharmD PGY1 Pharmacy  Resident 11/21/2018 1:59 PM

## 2018-11-21 NOTE — Progress Notes (Signed)
Patient had a BM, while RN was cleaning up patient the patient continued to stool and was also coughing.  Patient began to moderately bleed from impella site.  RN held pressure for 20 minutes, thrombi pad and new dressing applied.

## 2018-11-22 ENCOUNTER — Inpatient Hospital Stay (HOSPITAL_COMMUNITY): Payer: Medicare Other

## 2018-11-22 DIAGNOSIS — Z95811 Presence of heart assist device: Secondary | ICD-10-CM

## 2018-11-22 DIAGNOSIS — I214 Non-ST elevation (NSTEMI) myocardial infarction: Secondary | ICD-10-CM

## 2018-11-22 LAB — COOXEMETRY PANEL
Carboxyhemoglobin: 1.2 % (ref 0.5–1.5)
Carboxyhemoglobin: 1.4 % (ref 0.5–1.5)
Methemoglobin: 1.8 % — ABNORMAL HIGH (ref 0.0–1.5)
Methemoglobin: 1.8 % — ABNORMAL HIGH (ref 0.0–1.5)
O2 Saturation: 45.4 %
O2 Saturation: 58.3 %
Total hemoglobin: 8.6 g/dL — ABNORMAL LOW (ref 12.0–16.0)
Total hemoglobin: 8.3 g/dL — ABNORMAL LOW (ref 12.0–16.0)

## 2018-11-22 LAB — CBC WITH DIFFERENTIAL/PLATELET
Abs Immature Granulocytes: 1.74 10*3/uL — ABNORMAL HIGH (ref 0.00–0.07)
Basophils Absolute: 0.1 10*3/uL (ref 0.0–0.1)
Basophils Relative: 0 %
Eosinophils Absolute: 0.1 10*3/uL (ref 0.0–0.5)
Eosinophils Relative: 0 %
HCT: 26.8 % — ABNORMAL LOW (ref 39.0–52.0)
Hemoglobin: 8.9 g/dL — ABNORMAL LOW (ref 13.0–17.0)
Immature Granulocytes: 6 %
Lymphocytes Relative: 3 %
Lymphs Abs: 0.8 10*3/uL (ref 0.7–4.0)
MCH: 28.9 pg (ref 26.0–34.0)
MCHC: 33.2 g/dL (ref 30.0–36.0)
MCV: 87 fL (ref 80.0–100.0)
Monocytes Absolute: 0.8 10*3/uL (ref 0.1–1.0)
Monocytes Relative: 3 %
Neutro Abs: 27 10*3/uL — ABNORMAL HIGH (ref 1.7–7.7)
Neutrophils Relative %: 88 %
Platelets: 76 10*3/uL — ABNORMAL LOW (ref 150–400)
RBC: 3.08 MIL/uL — ABNORMAL LOW (ref 4.22–5.81)
RDW: 22.6 % — ABNORMAL HIGH (ref 11.5–15.5)
WBC: 30.4 10*3/uL — ABNORMAL HIGH (ref 4.0–10.5)
nRBC: 4.3 % — ABNORMAL HIGH (ref 0.0–0.2)

## 2018-11-22 LAB — C DIFFICILE QUICK SCREEN W PCR REFLEX
C Diff antigen: NEGATIVE
C Diff interpretation: NOT DETECTED
C Diff toxin: NEGATIVE

## 2018-11-22 LAB — BASIC METABOLIC PANEL
Anion gap: 10 (ref 5–15)
BUN: 118 mg/dL — ABNORMAL HIGH (ref 8–23)
CO2: 20 mmol/L — ABNORMAL LOW (ref 22–32)
Calcium: 7.4 mg/dL — ABNORMAL LOW (ref 8.9–10.3)
Chloride: 110 mmol/L (ref 98–111)
Creatinine, Ser: 3.17 mg/dL — ABNORMAL HIGH (ref 0.61–1.24)
GFR calc Af Amer: 22 mL/min — ABNORMAL LOW (ref >60)
GFR calc non Af Amer: 19 mL/min — ABNORMAL LOW (ref >60)
Glucose, Bld: 236 mg/dL — ABNORMAL HIGH (ref 70–99)
Potassium: 4.9 mmol/L (ref 3.5–5.1)
Sodium: 140 mmol/L (ref 135–145)

## 2018-11-22 LAB — ECHOCARDIOGRAM LIMITED
Height: 74 in
Height: 74 in
Weight: 3139.35 oz
Weight: 3139.35 oz

## 2018-11-22 LAB — CBC
HCT: 26.6 % — ABNORMAL LOW (ref 39.0–52.0)
Hemoglobin: 8.3 g/dL — ABNORMAL LOW (ref 13.0–17.0)
MCH: 27.7 pg (ref 26.0–34.0)
MCHC: 31.2 g/dL (ref 30.0–36.0)
MCV: 88.7 fL (ref 80.0–100.0)
Platelets: 108 10*3/uL — ABNORMAL LOW (ref 150–400)
RBC: 3 MIL/uL — ABNORMAL LOW (ref 4.22–5.81)
RDW: 22.7 % — ABNORMAL HIGH (ref 11.5–15.5)
WBC: 30.1 10*3/uL — ABNORMAL HIGH (ref 4.0–10.5)
nRBC: 3.8 % — ABNORMAL HIGH (ref 0.0–0.2)

## 2018-11-22 LAB — PATHOLOGIST SMEAR REVIEW

## 2018-11-22 LAB — PHOSPHORUS: Phosphorus: 4.9 mg/dL — ABNORMAL HIGH (ref 2.5–4.6)

## 2018-11-22 LAB — ABO/RH: ABO/RH(D): AB POS

## 2018-11-22 LAB — MAGNESIUM: Magnesium: 2.6 mg/dL — ABNORMAL HIGH (ref 1.7–2.4)

## 2018-11-22 LAB — GLUCOSE, CAPILLARY
Glucose-Capillary: 180 mg/dL — ABNORMAL HIGH (ref 70–99)
Glucose-Capillary: 205 mg/dL — ABNORMAL HIGH (ref 70–99)
Glucose-Capillary: 161 mg/dL — ABNORMAL HIGH (ref 70–99)
Glucose-Capillary: 156 mg/dL — ABNORMAL HIGH (ref 70–99)
Glucose-Capillary: 195 mg/dL — ABNORMAL HIGH (ref 70–99)
Glucose-Capillary: 226 mg/dL — ABNORMAL HIGH (ref 70–99)

## 2018-11-22 LAB — LACTATE DEHYDROGENASE: LDH: 877 U/L — ABNORMAL HIGH (ref 98–192)

## 2018-11-22 MED ORDER — AMIODARONE IV BOLUS ONLY 150 MG/100ML: 150.0000 mg | Freq: Once | INTRAVENOUS | Status: DC

## 2018-11-22 MED ORDER — LIDOCAINE HCL (PF) 1 % IJ SOLN
INTRAMUSCULAR | Status: AC
  Filled 2018-11-22: qty 5

## 2018-11-22 MED ORDER — DOBUTAMINE HCL 250 MG/20ML IV SOLN
2.0000 ug/kg/min | INTRAVENOUS | Status: DC
  Filled 2018-11-22: qty 8

## 2018-11-22 MED ORDER — ALBUMIN HUMAN 25 % IV SOLN
12.5000 g | Freq: Once | INTRAVENOUS | Status: DC
  Filled 2018-11-22: qty 50

## 2018-11-22 MED ORDER — DOBUTAMINE IN D5W 4-5 MG/ML-% IV SOLN
INTRAVENOUS | Status: AC
  Administered 2018-11-22: 1000 mg via INTRAVENOUS
  Filled 2018-11-22: qty 250

## 2018-11-22 MED ORDER — LIDOCAINE HCL (PF) 1 % IJ SOLN
INTRAMUSCULAR | Status: AC
  Administered 2018-11-22: 18:00:00
  Filled 2018-11-22: qty 30

## 2018-11-22 MED ORDER — AMIODARONE LOAD VIA INFUSION
150.0000 mg | Freq: Once | INTRAVENOUS | Status: AC
  Administered 2018-11-22: 16:00:00 150 mg via INTRAVENOUS
  Filled 2018-11-22: qty 83.34

## 2018-11-22 MED ORDER — DOBUTAMINE IN D5W 4-5 MG/ML-% IV SOLN
2.0000 ug/kg/min | INTRAVENOUS | Status: DC
  Administered 2018-11-22: 19:00:00 1000 mg via INTRAVENOUS

## 2018-11-22 MED ORDER — SODIUM CHLORIDE 0.9 % IV BOLUS
250.0000 mL | Freq: Once | INTRAVENOUS | Status: AC
  Administered 2018-11-22: 19:00:00 250 mL via INTRAVENOUS

## 2018-11-22 MED ORDER — "THROMBI-PAD 3""X3"" EX PADS"
1.0000 | MEDICATED_PAD | CUTANEOUS | Status: AC
  Administered 2018-11-22: 12:00:00 1 via TOPICAL
  Filled 2018-11-22: qty 1

## 2018-11-22 MED ORDER — FUROSEMIDE 10 MG/ML IJ SOLN
80.0000 mg | Freq: Once | INTRAMUSCULAR | Status: AC
  Administered 2018-11-22: 08:00:00 80 mg via INTRAVENOUS
  Filled 2018-11-22: qty 8

## 2018-11-22 NOTE — Progress Notes (Signed)
Devota Pace RN and Laverna Peace RN wasted 57mL Versed and 58mL Fent drip in sink.

## 2018-11-22 NOTE — Evaluation (Signed)
SLP Cancellation Note  Patient Details Name: Eric Spencer MRN: [id number] DOB: 01/25/1946   Cancelled treatment:       Reason Eval/Treat Not Completed: Other (comment);Fatigue/lethargy limiting ability to participate(pt extubated 2 hours ago after being on vent since 5/24; will follow up next date)   Chales Abrahams 11/22/2018, 12:29 PM   Donavan Burnet, MS [facility] SLP Acute Rehab Services Pager [phone] Office [phone]

## 2018-11-22 NOTE — Progress Notes (Signed)
Patient ID: Eric Spencer, male   DOB: Nov 12, 1945, 73 y.o.   MRN: [id number]  Patient developed suction alarms again this evening.  He was seen initially by Dr. Rennis Golden who was on call.  Under echo guidance, the Impella catheter appeared deep again. It was withdrawn around 5 cm unfortunately with the inflow removed beyond the aortic valve.  I came in to help and noted that the pigtail remained across the aortic valve.  I advanced the Impella inflow back into the left ventricle.  We achieved good waveforms again with no suction alarms.  Will keep him at P6 for now, flow back to 2.9-3 L/min, Impella at around 3.5 cm. During this time, he had to be started on dobutamine, vasopressin, and norepinephrine was increased. Paitent was placed on Bipap.  We have been able to stop vasopressin.  He remains on dobutamine 2 and norepinephrine 20. Will slowly titrate down norepinephrine this evening as needed.  CVP 10.   CRITICAL CARE Performed by: Marca Ancona  Total critical care time: 30 minutes  Critical care time was exclusive of separately billable procedures and treating other patients.  Critical care was necessary to treat or prevent imminent or life-threatening deterioration.  Critical care was time spent personally by me on the following activities: development of treatment plan with patient and/or surrogate as well as nursing, discussions with consultants, evaluation of patient's response to treatment, examination of patient, obtaining history from patient or surrogate, ordering and performing treatments and interventions, ordering and review of laboratory studies, ordering and review of radiographic studies, pulse oximetry and re-evaluation of patient's condition.  Marca Ancona 11/22/2018 10:59 PM

## 2018-11-22 NOTE — Progress Notes (Signed)
NAME:  Eric Spencer, MRN:  [id number], DOB:  Feb 11, 1946, LOS: 6 ADMISSION DATE:  11/21/2018, CONSULTATION DATE:  11/16/18 REFERRING MD:  Dr. Delton See, CHIEF COMPLAINT:  SOB   Brief History   73 y/o M, former smoker (quit 3 years ago), admitted 5/22 from [facility] with shortness of breath. Work up concerning for possible AECOPD, NSTEMI, AKI.  Transferred to ICU 5/24 with SOB and concern for cardiogenic shock.   Past Medical History  COPD  HTN  Allergies  Arthritis   Significant Hospital Events   5/23 Admit  5/25 - Pt reports he was sleeping and woke due to nursing being concerned for hypotension.  Transferred to ICU. 5/27 weaning well on vent.   Consults:  PCCM  Procedures:  ETT 5/24 -  CVL 5/24 -  ART line 5/24 > Impella  Significant Diagnostic Tests:   CXR 5/28>bilateral opacities suggestive of pulmonary edema. Impella device, ETT, Swan ganz, NGT unchanged  CXR 5/29>some interval improvement of pulmonary edema   Micro Data:  COVID 5/22 >> negative  BCx2 5/23 >> NGTD UCx 5/25> NGTD   Antimicrobials:  Doxycycline 5/23 >> 5/24 Cefepime 5/ 25 > 5/26 Azithromycin 5/26 >>>   Interim history/subjective:  Improving shock, off vaso and NE Cr very slowly improving  Overnight, oozing from impella groin sight, required 2PRBC Weaning vent   Objective   Blood pressure 105/73, pulse 94, temperature 98.6 F (37 C), resp. rate 13, height 6\' 2"  (1.88 m), weight 89 kg, SpO2 96 %. PAP: (19-52)/(9-25) 24/14 CVP:  [2 mmHg-11 mmHg] 8 mmHg PCWP:  [9 mmHg-13 mmHg] 10 mmHg CO:  [3.7 L/min-6.1 L/min] 3.7 L/min CI:  [1.8 L/min/m2-2.9 L/min/m2] 1.8 L/min/m2  Vent Mode: CPAP;PSV FiO2 (%):  [40 %] 40 % Set Rate:  [18 bmp] 18 bmp Vt Set:  [650 mL] 650 mL PEEP:  [5 cmH20] 5 cmH20 Pressure Support:  [5 cmH20-8 cmH20] 5 cmH20 Plateau Pressure:  [9 cmH20-14 cmH20] 14 cmH20   Intake/Output Summary (Last 24 hours) at 11/22/2018 0809 Last data filed at 11/22/2018 0749 Gross per 24 hour    Intake 4660.56 ml  Output 2070 ml  Net 2590.56 ml   Filed Weights   11/20/18 0500 11/21/18 0600 11/22/18 0600  Weight: 87.6 kg 83.6 kg 89 kg    General:  Elderly, chronically ill appearing male, intubated, sedated, NAD Neuro:  Awakens to voice, very drowsy. Follows commands. PERRL HEENT:  NCAT ETT secure trachea midline. Anicteric sclera. Pink mmm  Cardiovascular:  RRR s1s2 no rgm. 1+ radial pulses  Lungs: No adventitious sounds. Diminished bibasilar breath sounds  Abdomen:  Soft, round, ndnt. Normoactive x4 Musculoskeletal:  No obvious joint deformity, no BUE BLE edema. No clubbing, no cyanosis  Skin:  Clean, dry, warm intact. Scattered ecchymosis BUE.   LABS    PULMONARY Recent Labs  Lab 12/04/2018 0400  12-04-2018 0620 04-Dec-2018 1143 12-04-18 1145 2018-12-04 1151 12/04/2018 1618  11/18/18 0234  11/20/18 0335 11/20/18 1622 11/21/18 0355 11/22/18 0240 11/22/18 0340  PHART 7.363  --  7.341* 7.352  --   --  7.361  --  7.422  --   --   --   --   --   --   PCO2ART 35.3  --  34.7 36.3  --   --  39.1  --  30.7*  --   --   --   --   --   --   PO2ART 85.9  --  77.0* 423.0*  --   --  510.0*  --  67.0*  --   --   --   --   --   --   HCO3 19.6*  --  18.8* 20.1 20.6 20.5 22.1  --  19.9*  --   --   --   --   --   --   TCO2  --    < > 20* 21* 22 22 23   --  21*  --   --   --   --   --   --   O2SAT 95.8  --  95.0 100.0 62.0 66.0 100.0   < > 93.0   < > 69.7 61.9 74.1 45.4 58.3   < > = values in this interval not displayed.    CBC Recent Labs  Lab 11/21/18 0343 11/21/18 2256 11/22/18 0229  HGB 8.0* 7.4* 8.3*  HCT 24.5* 23.5* 26.6*  WBC 21.8* 25.9* 30.1*  PLT 107* 102* 108*    COAGULATION Recent Labs  Lab 09-10-2018 2146 11/19/18 0458  INR 1.1 1.5*    CARDIAC   Recent Labs  Lab 11/16/18 0223 11/16/18 0718 11/16/18 1417 11/10/2018 0453 11/19/18 0458  TROPONINI 0.98* 3.29* 6.46* 10.33* 10.23*   No results for input(s): PROBNP in the last 168 hours.   CHEMISTRY Recent  Labs  Lab 11/16/18 0223  11/18/18 0220 11/18/18 0234 11/19/18 0458 11/20/18 0330 11/21/18 0343 11/22/18 0229  NA 138   < > 136 136 136 138 140 140  K 4.5   < > 4.6 4.4 4.9 4.5 4.5 4.9  CL 104   < > 106  --  107 108 109 110  CO2 15*   < > 22  --  19* 20* 20* 20*  GLUCOSE 238*   < > 157*  --  206* 192* 204* 236*  BUN 37*   < > 85*  --  94* 100* 103* 118*  CREATININE 2.38*   < > 3.42*  --  3.29* 3.22* 3.23* 3.17*  CALCIUM 9.3   < > 8.2*  --  8.3* 8.1* 7.7* 7.4*  MG 2.3  --   --   --  2.5* 2.7* 2.6* 2.6*  PHOS  --   --   --   --  5.0* 4.6 4.2 4.9*   < > = values in this interval not displayed.   Estimated Creatinine Clearance: 24.9 mL/min (A) (by C-G formula based on SCr of 3.17 mg/dL (H)).   LIVER Recent Labs  Lab 09-10-2018 2146 11/16/18 0223 11/19/18 0458  AST  --  27 102*  ALT  --  19 25  ALKPHOS  --  58 34*  BILITOT  --  0.6 1.8*  PROT  --  7.1 4.8*  ALBUMIN  --  3.6 2.3*  INR 1.1  --  1.5*     INFECTIOUS Recent Labs  Lab 10/31/2018 0453 10/28/2018 1616 11/18/18 0220 11/18/18 0837 11/19/18 0000 11/19/18 0458  LATICACIDVEN 3.7* 3.6* 1.5  --  1.1  --   PROCALCITON 0.42  --   --  2.26  --  2.82     ENDOCRINE CBG (last 3)  Recent Labs    11/21/18 2323 11/22/18 0340 11/22/18 0745  GLUCAP 180* 205* 161*     IMAGING x48h Dg Chest Port 1 View  Result Date: 11/22/2018 CLINICAL DATA:  Intubation. EXAM: PORTABLE CHEST 1 VIEW COMPARISON:  11/21/2018. FINDINGS: Endotracheal tube, left IJ line, NG tube in stable position. Swan-Ganz catheter in unchanged position with tip over the  lower right pulmonary artery. Impella device in unchanged position. Heart size stable. Interim partial clearing of previously identified bilateral pulmonary interstitial prominence consistent partial clearing of interstitial edema. No prominent pleural effusion. Right costophrenic angle incompletely imaged. No pneumothorax. IMPRESSION: 1. Lines and tubes in unchanged position. Impella device  in stable position. 2. Interim partial clearing of previously identified bilateral pulmonary interstitial prominence consistent partial clearing of interstitial edema. Electronically Signed   By: Maisie Fus  Register   On: 11/22/2018 06:50   Dg Chest Port 1 View  Result Date: 11/21/2018 CLINICAL DATA:  Intubation EXAM: PORTABLE CHEST 1 VIEW COMPARISON:  11/20/2018 FINDINGS: Endotracheal tube, NG tube, central venous line, Swan-Ganz catheter unchanged. Swan-Ganz catheter in the RIGHT pulmonary artery. Impella device in the ascending aorta and LEFT heart. Fine basilar airspace disease. No focal consolidation. No pneumothorax. IMPRESSION: 1. Stable support apparatus. 2. Fine basilar airspace disease suggests mild pulmonary edema. Electronically Signed   By: Genevive Bi M.D.   On: 11/21/2018 08:23   Dg Chest Port 1 View  Result Date: 11/20/2018 CLINICAL DATA:  CHF EXAM: PORTABLE CHEST 1 VIEW COMPARISON:  11/18/2018 FINDINGS: No significant change in mild diffuse interstitial pulmonary opacity, consistent with edema. No new airspace opacity. Mild cardiomegaly. Unchanged support apparatus including endotracheal tube, left neck vascular catheter, esophagogastric tube, inferior approach pulmonary vascular catheter, and Impella device. IMPRESSION: No significant change in mild diffuse interstitial pulmonary opacity, consistent with edema. No new airspace opacity. Mild cardiomegaly. Unchanged support apparatus including endotracheal tube, left neck vascular catheter, esophagogastric tube, inferior approach pulmonary vascular catheter, and Impella device. Electronically Signed   By: Lauralyn Primes M.D.   On: 11/20/2018 08:29    Resolved Hospital Problem list      Assessment & Plan:   Acute hypoxic respiratory failure in setting of CAD, cardiogenic pulmonary edema COPD, without acute exacerbation  Concern for PNA given + Urine legionella P - Weaning vent qAM. Continues to wean well 5/29 - Prior PCCM notes  have indicated to remain intubated until impella removed and cath complete. Discussed with Dr. Shirlee Latch this morning--preference is to extubate if able as return to cath and removal of impella are still down the road a bit. - Diuresis per heart failure service (will receive lasix 5/28)  - VAP bundle - Continue azithromycin for 10 days for legionnaire. Can be changed to levaquin if cutaneous manifestations arise  - Low threshold to dc IV steroids following 5 day course  - Brovana/budesonide, PRN duoneb  - AM CXR    Paroxysmal Afib with RVR, currently NSR Cardiogenic shock, improved P - Impella management per HF team - Off pressors - Continue amio and heparin gtt - Continue tele - On stress dose steroids   AKI, non-oliguric -nephrology following - no indication to dialyze at this time - Continue to trend BMP, UOP     Rest Per Primary   Best practice:  Diet: EN Pain/Anxiety/Delirium protocol (if indicated): fent gtt, PRN fent  VAP protocol (if indicated): VAP prevention bundle  DVT prophylaxis: heparin gtt  GI prophylaxis: pepcid BID  Glucose control: per primary  Mobility: bedrest  Code Status: Full Code  Family Communication: Per cardiology Disposition: Per cardiology    Critical Care Time : 30 minutes   Tessie Fass MSN, AGACNP-BC [place] [facility] [phone] If no answer, [phone] 11/22/2018, 8:09 AM

## 2018-11-22 NOTE — Progress Notes (Signed)
Patient ID: Eric Spencer, male   DOB: 12-25-1945, 73 y.o.   MRN: [id number] S: Weaned off of levophed and currently on weaning trials from vent O:BP 104/78   Pulse (!) 117   Temp 99.5 F (37.5 C)   Resp (!) 29   Ht '6\' 2"'  (1.88 m)   Wt 89 kg   SpO2 94%   BMI 25.19 kg/m   Intake/Output Summary (Last 24 hours) at 11/22/2018 1030 Last data filed at 11/22/2018 1000 Gross per 24 hour  Intake 4444.71 ml  Output 2330 ml  Net 2114.71 ml   Intake/Output: I/O last 3 completed shifts: In: 9562 [I.V.:1979.3; Blood:315; Other:992.7; NG/GT:2710; IV Piggyback:350] Out: 1308 [Urine:3070; Stool:200]  Intake/Output this shift:  Total I/O In: 471.5 [I.V.:117.9; Blood:315; Other:38.6] Out: 875 [Urine:875] Weight change: 5.4 kg Gen: agitated elderly male intubated CVS: tachy Resp: scattered rhonchi Abd: +BS, soft, nt Ext: no edema  Recent Labs  Lab 11/16/18 0223 10/28/2018 0453  11/05/2018 1618 11/18/18 0220 11/18/18 0234 11/19/18 0458 11/20/18 0330 11/21/18 0343 11/22/18 0229  NA 138 139   < > 136 136 136 136 138 140 140  K 4.5 5.2*   < > 4.7 4.6 4.4 4.9 4.5 4.5 4.9  CL 104 105  --   --  106  --  107 108 109 110  CO2 15* 17*  --   --  22  --  19* 20* 20* 20*  GLUCOSE 238* 244*  --   --  157*  --  206* 192* 204* 236*  BUN 37* 62*  --   --  85*  --  94* 100* 103* 118*  CREATININE 2.38* 2.56*  --   --  3.42*  --  3.29* 3.22* 3.23* 3.17*  ALBUMIN 3.6  --   --   --   --   --  2.3*  --   --   --   CALCIUM 9.3 9.3  --   --  8.2*  --  8.3* 8.1* 7.7* 7.4*  PHOS  --   --   --   --   --   --  5.0* 4.6 4.2 4.9*  AST 27  --   --   --   --   --  102*  --   --   --   ALT 19  --   --   --   --   --  25  --   --   --    < > = values in this interval not displayed.   Liver Function Tests: Recent Labs  Lab 11/16/18 0223 11/19/18 0458  AST 27 102*  ALT 19 25  ALKPHOS 58 34*  BILITOT 0.6 1.8*  PROT 7.1 4.8*  ALBUMIN 3.6 2.3*   No results for input(s): LIPASE, AMYLASE in the last 168  hours. No results for input(s): AMMONIA in the last 168 hours. CBC: Recent Labs  Lab 10/28/2018 2146  11/20/18 0330 11/20/18 1335 11/21/18 0343 11/21/18 2256 11/22/18 0229  WBC 10.0   < > 23.7* 25.1* 21.8* 25.9* 30.1*  NEUTROABS 8.9*  --   --   --   --   --   --   HGB 12.4*   < > 8.1* 8.4* 8.0* 7.4* 8.3*  HCT 39.0   < > 24.4* 26.1* 24.5* 23.5* 26.6*  MCV 94.0   < > 91.0 92.2 92.1 94.8 88.7  PLT 232   < > 115* 115* 107* 102* 108*   < > =  values in this interval not displayed.   Cardiac Enzymes: Recent Labs  Lab 11/16/18 0223 11/16/18 0718 11/16/18 1417 11/02/2018 0453 11/19/18 0458  TROPONINI 0.98* 3.29* 6.46* 10.33* 10.23*   CBG: Recent Labs  Lab 11/21/18 1555 11/21/18 1958 11/21/18 2323 11/22/18 0340 11/22/18 0745  GLUCAP 194* 153* 180* 205* 161*    Iron Studies: No results for input(s): IRON, TIBC, TRANSFERRIN, FERRITIN in the last 72 hours. Studies/Results: Dg Chest Port 1 View  Result Date: 11/22/2018 CLINICAL DATA:  Intubation. EXAM: PORTABLE CHEST 1 VIEW COMPARISON:  11/21/2018. FINDINGS: Endotracheal tube, left IJ line, NG tube in stable position. Swan-Ganz catheter in unchanged position with tip over the lower right pulmonary artery. Impella device in unchanged position. Heart size stable. Interim partial clearing of previously identified bilateral pulmonary interstitial prominence consistent partial clearing of interstitial edema. No prominent pleural effusion. Right costophrenic angle incompletely imaged. No pneumothorax. IMPRESSION: 1. Lines and tubes in unchanged position. Impella device in stable position. 2. Interim partial clearing of previously identified bilateral pulmonary interstitial prominence consistent partial clearing of interstitial edema. Electronically Signed   By: Marcello Moores  Register   On: 11/22/2018 06:50   Dg Chest Port 1 View  Result Date: 11/21/2018 CLINICAL DATA:  Intubation EXAM: PORTABLE CHEST 1 VIEW COMPARISON:  11/20/2018 FINDINGS:  Endotracheal tube, NG tube, central venous line, Swan-Ganz catheter unchanged. Swan-Ganz catheter in the RIGHT pulmonary artery. Impella device in the ascending aorta and LEFT heart. Fine basilar airspace disease. No focal consolidation. No pneumothorax. IMPRESSION: 1. Stable support apparatus. 2. Fine basilar airspace disease suggests mild pulmonary edema. Electronically Signed   By: Suzy Bouchard M.D.   On: 11/21/2018 08:23   . arformoterol  15 mcg Nebulization BID  . aspirin  81 mg Oral Daily  . atorvastatin  80 mg Oral q1800  . budesonide (PULMICORT) nebulizer solution  0.5 mg Nebulization BID  . chlorhexidine gluconate (MEDLINE KIT)  15 mL Mouth Rinse BID  . Chlorhexidine Gluconate Cloth  6 each Topical Daily  . feeding supplement (PRO-STAT SUGAR FREE 64)  30 mL Per Tube Daily  . insulin aspart  0-15 Units Subcutaneous Q4H  . loratadine  10 mg Oral Daily  . mouth rinse  15 mL Mouth Rinse 10 times per day  . methylPREDNISolone (SOLU-MEDROL) injection  40 mg Intravenous Q12H  . pantoprazole (PROTONIX) IV  40 mg Intravenous Q12H  . sodium chloride flush  10-40 mL Intracatheter Q12H  . sodium chloride flush  3 mL Intravenous Q12H  . sodium chloride flush  3 mL Intravenous Q12H    BMET    Component Value Date/Time   NA 140 11/22/2018 0229   K 4.9 11/22/2018 0229   CL 110 11/22/2018 0229   CO2 20 (L) 11/22/2018 0229   GLUCOSE 236 (H) 11/22/2018 0229   BUN 118 (H) 11/22/2018 0229   CREATININE 3.17 (H) 11/22/2018 0229   CREATININE 1.10 05/23/2017 1240   CALCIUM 7.4 (L) 11/22/2018 0229   GFRNONAA 19 (L) 11/22/2018 0229   GFRNONAA 68 05/23/2017 1240   GFRAA 22 (L) 11/22/2018 0229   GFRAA 79 05/23/2017 1240   CBC    Component Value Date/Time   WBC 30.1 (H) 11/22/2018 0229   RBC 3.00 (L) 11/22/2018 0229   HGB 8.3 (L) 11/22/2018 0229   HCT 26.6 (L) 11/22/2018 0229   PLT 108 (L) 11/22/2018 0229   MCV 88.7 11/22/2018 0229   MCH 27.7 11/22/2018 0229   MCHC 31.2 11/22/2018 0229    RDW 22.7 (H)  11/22/2018 0229   LYMPHSABS 0.8 11/03/2018 2146   MONOABS 0.3 11/20/2018 2146   EOSABS 0.0 11/12/2018 2146   BASOSABS 0.0 11/21/2018 2146    Assessment/Plan: 1. AKI- presumably due to ischemic ATN in setting of cardiogenic shock, pressors, reduced EF, and IV contrast (although only received 47m). Thankfully his UOP has picked up and his Cr is slightly improved, however there will likely be staged cardiac cath with interventions in his future. 1. Cr slowly improving 2. Continue to follow for now as no indication for HD.  3. Will need to minimize contrast for any further studies/interventions, however it may be some time before he can undergo another cath since Cr has reached a plateau. 2. Ischemic cardiomyopathy with cardiogenic shock- s/p Impella, weaned off of levophed and vasopressin.  3. CAD s/p NSTEMI- multi-vessel dz. On heparin with Impella, statin, and aspirin. No beta-blocker due to cardiogenic shock 4. Elevated LDH- likely hemolysis from Impella per Cardiology 5. Acute hyoxic respiratory failure- intubated and managed by PCCM 6. CAP- + legionella- on azithromycin. 7. P. A fib- on amiodarone 8. ABLA s/p procedural hematoma. Transfuse prn 9. ID- on abx for possible PNA.  JDonetta Potts MD [employer]([phone]

## 2018-11-22 NOTE — Procedures (Signed)
Arterial Catheter Insertion Procedure Note Geral Malpass [id number] 03/29/1946  Procedure: Insertion of Arterial Catheter  Indications: Blood pressure monitoring and Frequent blood sampling  Procedure Details Consent: Unable to obtain consent because of emergent medical necessity. Time Out: Verified patient identification, verified procedure, site/side was marked, verified correct patient position, special equipment/implants available, medications/allergies/relevent history reviewed, required imaging and test results available.  Performed  Maximum sterile technique was used including antiseptics, cap, gloves, gown, hand hygiene, mask and sheet. Skin prep: Chlorhexidine; local anesthetic administered 20 gauge catheter was inserted into left radial artery using the Seldinger technique. ULTRASOUND GUIDANCE USED: YES Evaluation Blood flow good; BP tracing good. Complications: No apparent complications.  Jovita Kussmaul, AGACNP-BC [facility]  Pgr: [phone]  PCCM Pgr: [phone]

## 2018-11-22 NOTE — Progress Notes (Signed)
Asked by Dr. Shirlee Latch to evaluate impella alarm. He had adjusted the device earlier and felt it may be too far advanced. Under direct echo guidance, the catheter could not be well-visualized. I personally withdrew the impella up to 5 cm by 1 cm increments, with no change in waveform. It was noted that there were 2 simultaneous aortic waveforms. A chest x-ray was obtained which demonstrated the tip of the device in the distal ascending aorta. Dr. Shirlee Latch and I then advanced the impella until the device eventually sprung into the left ventricle. It was then guided under echo to a position 3.5 cm distal to the aortic valve and secured into place. The power was increased to 6, however, there was intermittent dislodgement and it was then decreased to 4 which provided 2.5L/min CO and good waveforms.  Chrystie Nose, MD, [facility], FACP  [place]  [facility]  Medical Director of the Advanced Lipid Disorders &  Cardiovascular Risk Reduction Clinic Diplomate of the American Board of Clinical Lipidology Attending Cardiologist  Direct Dial: [phone]  Fax: [phone]  Website:  [email or web address]

## 2018-11-22 NOTE — Progress Notes (Signed)
  Echocardiogram 2D Echocardiogram has been performed.  Eric Spencer 11/22/2018, 8:54 PM

## 2018-11-22 NOTE — Progress Notes (Addendum)
Patient ID: Eric Spencer, male   DOB: August 01, 1945, 73 y.o.   MRN: [id number]    Advanced Heart Failure Rounding Note   Subjective:    Intubated but awake and will follow commands.  He is now off vasopressin and norepinephrine stopped this morning.  MAP now 70s. BUN/creatinine 100/3.22 => 103/3.23 => 118/3.17.    Oozing from Impella site in groin, there is adjacent hematoma.  Bleeding has now stopped.  He received 2 units PRBCs last night, hgb 8.3 today.   Today, he is back in NSR rate 80s on amiodarone gtt.   Started on stress-dose steroids and cefepime with fever, possible PNA.  This morning, afebrile with WBCs up to 30.  Urine Legionella antigen positive, antibiotics have been transitioned to azithromycin IV.    Impella at P-6. Flow 3.0.  Good waveforms.  No recent suction events.  Impella visualized under echo, stable position at 3.75 cm.   LDH 1,025  => 1208 => 1076 => 1107 => 877. Urine is clear.  Hgb 10.2 -> 9.2 -> 8.3 -> 8.1 -> 8 -> 7.4 -> 2 units -> 8.3, plts 237 -> 116 -> 115 -> 107 -> 108.  Swan numbers CVP 11 PA 30/20 Thermo CI 2.1 Co-ox 58%  Objective:   Weight Range:  Vital Signs:   Temp:  [97.5 F (36.4 C)-99.1 F (37.3 C)] 98.6 F (37 C) (05/29 0700) Pulse Rate:  [63-129] 87 (05/29 0700) Resp:  [11-34] 19 (05/29 0700) BP: (71-116)/(52-102) 90/68 (05/29 0700) SpO2:  [89 %-99 %] 94 % (05/29 0700) FiO2 (%):  [40 %] 40 % (05/29 0430) Weight:  [89 kg] 89 kg (05/29 0600) Last BM Date: 11/21/18  Weight change: Filed Weights   11/20/18 0500 11/21/18 0600 11/22/18 0600  Weight: 87.6 kg 83.6 kg 89 kg    Intake/Output:   Intake/Output Summary (Last 24 hours) at 11/22/2018 0722 Last data filed at 11/22/2018 0600 Gross per 24 hour  Intake 4379.57 ml  Output 2145 ml  Net 2234.57 ml     Physical Exam: General: Intubated/sedated.  Neck: JVP 9-10 cm, no thyromegaly or thyroid nodule.  Lungs: Decreased BS at bases. CV: Nondisplaced PMI.  Heart regular  S1/S2, no S3/S4, no murmur.  No peripheral edema.   Abdomen: Soft, nontender, no hepatosplenomegaly, no distention.  Skin: Intact without lesions or rashes.  Neurologic: Sedated but will awaken and follow commands.   Psych: Normal affect. Extremities: No clubbing or cyanosis.  HEENT: Normal.    Telemetry: NSR 80s. Personally reviewed   Labs: Basic Metabolic Panel: Recent Labs  Lab 11/16/18 0223  11/18/18 0220 11/18/18 0234 11/19/18 0458 11/20/18 0330 11/21/18 0343 11/22/18 0229  NA 138   < > 136 136 136 138 140 140  K 4.5   < > 4.6 4.4 4.9 4.5 4.5 4.9  CL 104   < > 106  --  107 108 109 110  CO2 15*   < > 22  --  19* 20* 20* 20*  GLUCOSE 238*   < > 157*  --  206* 192* 204* 236*  BUN 37*   < > 85*  --  94* 100* 103* 118*  CREATININE 2.38*   < > 3.42*  --  3.29* 3.22* 3.23* 3.17*  CALCIUM 9.3   < > 8.2*  --  8.3* 8.1* 7.7* 7.4*  MG 2.3  --   --   --  2.5* 2.7* 2.6* 2.6*  PHOS  --   --   --   --  5.0* 4.6 4.2 4.9*   < > = values in this interval not displayed.    Liver Function Tests: Recent Labs  Lab 11/16/18 0223 11/19/18 0458  AST 27 102*  ALT 19 25  ALKPHOS 58 34*  BILITOT 0.6 1.8*  PROT 7.1 4.8*  ALBUMIN 3.6 2.3*   No results for input(s): LIPASE, AMYLASE in the last 168 hours. No results for input(s): AMMONIA in the last 168 hours.  CBC: Recent Labs  Lab 11/20/2018 2146  11/20/18 0330 11/20/18 1335 11/21/18 0343 11/21/18 2256 11/22/18 0229  WBC 10.0   < > 23.7* 25.1* 21.8* 25.9* 30.1*  NEUTROABS 8.9*  --   --   --   --   --   --   HGB 12.4*   < > 8.1* 8.4* 8.0* 7.4* 8.3*  HCT 39.0   < > 24.4* 26.1* 24.5* 23.5* 26.6*  MCV 94.0   < > 91.0 92.2 92.1 94.8 88.7  PLT 232   < > 115* 115* 107* 102* 108*   < > = values in this interval not displayed.    Cardiac Enzymes: Recent Labs  Lab 11/16/18 0223 11/16/18 0718 11/16/18 1417 10/30/2018 0453 11/19/18 0458  TROPONINI 0.98* 3.29* 6.46* 10.33* 10.23*    BNP: BNP (last 3 results) Recent Labs     11/16/18 0223  BNP 1,543.0*    ProBNP (last 3 results) No results for input(s): PROBNP in the last 8760 hours.    Other results:  Imaging: Dg Chest Port 1 View  Result Date: 11/22/2018 CLINICAL DATA:  Intubation. EXAM: PORTABLE CHEST 1 VIEW COMPARISON:  11/21/2018. FINDINGS: Endotracheal tube, left IJ line, NG tube in stable position. Swan-Ganz catheter in unchanged position with tip over the lower right pulmonary artery. Impella device in unchanged position. Heart size stable. Interim partial clearing of previously identified bilateral pulmonary interstitial prominence consistent partial clearing of interstitial edema. No prominent pleural effusion. Right costophrenic angle incompletely imaged. No pneumothorax. IMPRESSION: 1. Lines and tubes in unchanged position. Impella device in stable position. 2. Interim partial clearing of previously identified bilateral pulmonary interstitial prominence consistent partial clearing of interstitial edema. Electronically Signed   By: Marcello Moores  Register   On: 11/22/2018 06:50   Dg Chest Port 1 View  Result Date: 11/21/2018 CLINICAL DATA:  Intubation EXAM: PORTABLE CHEST 1 VIEW COMPARISON:  11/20/2018 FINDINGS: Endotracheal tube, NG tube, central venous line, Swan-Ganz catheter unchanged. Swan-Ganz catheter in the RIGHT pulmonary artery. Impella device in the ascending aorta and LEFT heart. Fine basilar airspace disease. No focal consolidation. No pneumothorax. IMPRESSION: 1. Stable support apparatus. 2. Fine basilar airspace disease suggests mild pulmonary edema. Electronically Signed   By: Suzy Bouchard M.D.   On: 11/21/2018 08:23   Dg Chest Port 1 View  Result Date: 11/20/2018 CLINICAL DATA:  CHF EXAM: PORTABLE CHEST 1 VIEW COMPARISON:  11/18/2018 FINDINGS: No significant change in mild diffuse interstitial pulmonary opacity, consistent with edema. No new airspace opacity. Mild cardiomegaly. Unchanged support apparatus including endotracheal tube, left  neck vascular catheter, esophagogastric tube, inferior approach pulmonary vascular catheter, and Impella device. IMPRESSION: No significant change in mild diffuse interstitial pulmonary opacity, consistent with edema. No new airspace opacity. Mild cardiomegaly. Unchanged support apparatus including endotracheal tube, left neck vascular catheter, esophagogastric tube, inferior approach pulmonary vascular catheter, and Impella device. Electronically Signed   By: Eddie Candle M.D.   On: 11/20/2018 08:29     Medications:     Scheduled Medications: . arformoterol  15 mcg Nebulization BID  .  aspirin  81 mg Oral Daily  . atorvastatin  80 mg Oral q1800  . budesonide (PULMICORT) nebulizer solution  0.5 mg Nebulization BID  . chlorhexidine gluconate (MEDLINE KIT)  15 mL Mouth Rinse BID  . Chlorhexidine Gluconate Cloth  6 each Topical Daily  . feeding supplement (PRO-STAT SUGAR FREE 64)  30 mL Per Tube Daily  . furosemide  80 mg Intravenous Once  . insulin aspart  0-15 Units Subcutaneous Q4H  . loratadine  10 mg Oral Daily  . mouth rinse  15 mL Mouth Rinse 10 times per day  . methylPREDNISolone (SOLU-MEDROL) injection  40 mg Intravenous Q12H  . pantoprazole (PROTONIX) IV  40 mg Intravenous Q12H  . sodium chloride flush  10-40 mL Intracatheter Q12H  . sodium chloride flush  3 mL Intravenous Q12H  . sodium chloride flush  3 mL Intravenous Q12H    Infusions: . sodium chloride 10 mL/hr at 11/21/18 0327  . sodium chloride 10 mL/hr at 11/22/18 0600  . amiodarone 30 mg/hr (11/22/18 0600)  . azithromycin Stopped (11/21/18 1411)  . famotidine (PEPCID) IV Stopped (11/21/18 2345)  . feeding supplement (VITAL AF 1.2 CAL) 65 mL/hr at 11/22/18 0600  . fentaNYL infusion INTRAVENOUS 100 mcg/hr (11/22/18 0600)  . impella catheter heparin 50 unit/mL in dextrose 5%    . heparin 800 Units/hr (11/22/18 0600)  . midazolam 0.5 mg/hr (11/22/18 0600)  . norepinephrine (LEVOPHED) Adult infusion 8 mcg/min (11/22/18  0600)  . sodium chloride    . vasopressin (PITRESSIN) infusion - *FOR SHOCK* Stopped (11/19/18 1039)    PRN Medications: sodium chloride, sodium chloride, acetaminophen, fentaNYL, ipratropium-albuterol, midazolam, midazolam, nitroGLYCERIN, ondansetron (ZOFRAN) IV, sennosides, sodium chloride flush, sodium chloride flush   Assessment/plan:   1. Shock: Primarily cardiogenic but also concern for component of septic shock with elevated PCT, fever, PNA.  Had acute NSTEMI/ischemic cardiomyopathy. Impella placed 5/24, at P6 currently with good flow 3.0 L/min. Echo with EF 20-25%. Swan in place in femoral vein. He has been off vasopressin, and norepinephrine stopped this morning. CI 2.1 this morning from swan with co-ox 58%.  CVP 11. BUN higher with creatinine lower.  - Will give 1 dose Lasix 80 mg IV,  aim to keep I/Os fairly even.  - I will leave Impella at current level, want to maintain cardiac output for now as we look for renal improvement.  Urine is clear this morning and LDH is significantly lower, good Impella position today (unchanged) at 3.75 cm. Think there is minimal hemolysis now that speed is down to P6.   2. CAD: NSTEMI, occluded distal LCx but culprit is likely 95% pLAD by cath 5/24 (no intervention yet).  TnI to 10.  - Continue heparin gtt (with Impella). Still with oozing at groin site last night but stopped with pressure and lowering heparin to run ACT low therpeutic.  - Atorvastatin 80 daily, ASA 81 daily. No b-blocker with shock  - Will need to return to cath lab when renal function stabilizes for PCI to pLAD, this may be a while as creatinine appears to have plateaued. Will start DAPT eventually (watch plts and hgb).  3. AKI: Creatinine up to 1.6 -> 2.5 -> 3.4 -> 3.29 -> 3.29 -> 3.23 -> 3.17, likely ATN due to cardiogenic shock, but with LDH initially > 1,000 may also have component of hemolysis from Impella.  - Minimal hemolysis now.  - Nephrology following, no acute need for  CVVH.   - Will need to await fall in creatinine  in order to get him to PCI. This may take some time.  4. Acute hypoxic respiratory failure due to #1: Intubated.  Has history of severe COPD.  However, suspect current episode is cardiac in origin. CXR today shows improvement in pulmonary edema pattern. He is now off norepinephrine and wakes up/follows commands.  - He is on Solumedrol with history of COPD and shock.  - CCM to see, would like to see him extubated today if possible.  Will probably try to leave Impella in for a little while longer to see if we can get improvement in renal function to allow PCI.   5. Paroxysmal Atrial fibrillation with RVR: New this admission after starting dobutamine. Now back in NSR - Continue IV amiodarone and heparin gtt.  6. ID: WBCs up to 30K but on steroids, last PCT 2.82.  Afebrile now.  Legionella urine antigen positive, concern for Legionella PNA.  - Continue azithromycin.  7. Acute blood loss anemia: Procedural/hematoma +/- hemolysis. He got 2 units PRBCs last night with more bleeding at groin site. Bleeding now controlled.  - Run heparin to keep ACT at low end of therapeutic range.  - keep active T&S 8. F/E/N: Started tube feeds.    CRITICAL CARE Performed by: Loralie Champagne  Total critical care time: 40 minutes  Critical care time was exclusive of separately billable procedures and treating other patients.  Critical care was necessary to treat or prevent imminent or life-threatening deterioration.  Critical care was time spent personally by me (independent of midlevel providers or residents) on the following activities: development of treatment plan with patient and/or surrogate as well as nursing, discussions with consultants, evaluation of patient's response to treatment, examination of patient, obtaining history from patient or surrogate, ordering and performing treatments and interventions, ordering and review of laboratory studies, ordering and  review of radiographic studies, pulse oximetry and re-evaluation of patient's condition.   Length of Stay: 6   Loralie Champagne MD 11/22/2018, 7:22 AM  Advanced Heart Failure Team Pager [phone] (M-F; [place])  Please contact [facility] for night-coverage after hours (4p -7a ) and weekends on [email or web address]  Continued oozing around Impella. We made sure that the sheath was in to the hub and I placed 2 stitches. If ongoing significant oozing, may need to ask vascular surgery to take a look   Loralie Champagne 11/22/2018 12:50 PM

## 2018-11-22 NOTE — Consult Note (Signed)
   Patient name: Eric Spencer MRN: [id number] DOB: 10-25-1945 Sex: male  REASON FOR CONSULT:   Bleeding around Impala right groin.  HPI:   Eric Spencer is a pleasant 73 y.o. male who had presented with NSTEMI and ischemic cardiomyopathy.  He had an Impala placed in the right groin on 09-Dec-2018.  I reviewed the op note and it looks like right common femoral arteriogram was obtained at the time of the insertion of the Impala and this demonstrated a widely patent common femoral artery and iliac artery.  Of note the Impella device had to be swapped out for a new device is the initial device had issues.  Patient is anticoagulated and has had some slow oozing around the exit site of the catheter.  1 pursestring suture was placed by Dr. Shirlee Latch.  He is continued to have some oozing and ecchymosis on the lateral aspect of his thigh.  The patient is sedated.  REVIEW OF SYSTEMS: pt sedated.    PHYSICAL EXAM:   Vitals:   11/22/18 1100 11/22/18 1200 11/22/18 1203 11/22/18 1300  BP: 104/76     Pulse: (!) 130 (!) 101  (!) 106  Resp: (!) 37 (!) 25  20  Temp: 99.5 F (37.5 C) 100 F (37.8 C) 100 F (37.8 C) 99.9 F (37.7 C)  TempSrc:   Core   SpO2: 92% 97%  97%  Weight:      Height:        GENERAL: The patient is a well-nourished male, in no acute distress. The vital signs are documented above. CARDIOVASCULAR: Irregular rhythm.  PULMONARY: There is good air exchange bilaterally without wheezing or rales. VASCULAR: The patient has some mild oozing around the exit site of his Impala device which is in the right femoral artery.  He also has a Swan in the right femoral vein. Both feet are pink warm and well-perfused.  DATA:   LABS: Hemoglobin is 8.9.  Hematocrit 26.8.  Platelets 76,000. ACT is 180.  The patient is on intravenous heparin.   MEDICAL ISSUES:   BLEEDING AROUND IMPALA SITE: The patient has some mild oozing around the Impala site which is not stopping with pressure.   Dr. Kathlee Nations Trigt plans on placing another suture around the device and hopefully this will control the bleeding.  If this does not control the bleeding the only other option I see short of surgery would be to try a FemoStop.  Obviously it would help to get the device out soon as possible however it looks like he will need this a little while longer.  Dr. Chestine Spore is on call this weekend if needed.  Waverly Ferrari [facility] [phone]

## 2018-11-22 NOTE — Progress Notes (Signed)
RT called to pt room for respiratory distress. Pt pursed lip breathing with increased work of breathing. Pt sats were 97% on 4LNC w/bubble humidification. RT placed pt on Servo I in NIV mode on 15/5 w/40% FIO2. Pt tolerating well at this time. Will obtain ABG in 30 minutes. RT will continue to monitor.

## 2018-11-22 NOTE — Procedures (Signed)
Extubation Procedure Note  Patient Details:   Name: Eric Spencer DOB: 1945-07-06 MRN: [id number]   Airway Documentation:    Vent end date: 11/22/18 Vent end time: 1005   Evaluation  O2 sats: stable throughout Complications: No apparent complications Patient did tolerate procedure well. Bilateral Breath Sounds: Clear, Diminished   Yes   Patient Extubated per MD order  Hassan Buckler 11/22/2018, 10:09 AM

## 2018-11-22 NOTE — Progress Notes (Signed)
  Echocardiogram 2D Echocardiogram has been performed.  [name] 11/22/2018, 5:42 PM

## 2018-11-22 NOTE — Progress Notes (Signed)
Called to see patient for increased respiratory rate, hypotension.  LV assist device manipulated by cardiology and patient placed on BIPAP ultimately 22/7 at FIO2 .60.  ABG with pH 7.27, pCO2 32, HCO3 16.   After an hour of observation with these two interventions patients RR down to 22 from 52 and BP increased from 60/47 (on levophed 25 ug/kg/min) to 110/62.  Will reevaluate in an hour to hour and half.  May yet require intubation.  Total time spent at bedside 1 hour in critical evaluation and intervention.

## 2018-11-22 NOTE — Progress Notes (Signed)
Patient ID: Eric Spencer, male   DOB: Jun 18, 1946, 73 y.o.   MRN: [id number]  Impella site sutured by Dr. Donata Clay with resolution of bleeding.  However, after this, Impella had suction alarming.  We visualized under echo and withdrew the catheter about 1 cm total with resolution of alarms.  Turned device back up to P6 and will give 250 cc bolus (CVP 8-9).    Eric Spencer 11/22/2018 5:29 PM

## 2018-11-23 ENCOUNTER — Encounter (HOSPITAL_COMMUNITY): Payer: Self-pay

## 2018-11-23 ENCOUNTER — Inpatient Hospital Stay (HOSPITAL_COMMUNITY): Payer: Medicare Other

## 2018-11-23 ENCOUNTER — Encounter (HOSPITAL_COMMUNITY): Payer: Self-pay | Admitting: Internal Medicine

## 2018-11-23 DIAGNOSIS — R0603 Acute respiratory distress: Secondary | ICD-10-CM

## 2018-11-23 LAB — POCT I-STAT 7, (LYTES, BLD GAS, ICA,H+H)
Acid-base deficit: 11 mmol/L — ABNORMAL HIGH (ref 0.0–2.0)
Acid-base deficit: 6 mmol/L — ABNORMAL HIGH (ref 0.0–2.0)
Acid-base deficit: 7 mmol/L — ABNORMAL HIGH (ref 0.0–2.0)
Acid-base deficit: 9 mmol/L — ABNORMAL HIGH (ref 0.0–2.0)
Bicarbonate: 14.7 mmol/L — ABNORMAL LOW (ref 20.0–28.0)
Bicarbonate: 16.4 mmol/L — ABNORMAL LOW (ref 20.0–28.0)
Bicarbonate: 19.1 mmol/L — ABNORMAL LOW (ref 20.0–28.0)
Bicarbonate: 17.1 mmol/L — ABNORMAL LOW (ref 20.0–28.0)
Calcium, Ion: 1.04 mmol/L — ABNORMAL LOW (ref 1.15–1.40)
Calcium, Ion: 1.05 mmol/L — ABNORMAL LOW (ref 1.15–1.40)
Calcium, Ion: 1.04 mmol/L — ABNORMAL LOW (ref 1.15–1.40)
Calcium, Ion: 1.12 mmol/L — ABNORMAL LOW (ref 1.15–1.40)
HCT: 21 % — ABNORMAL LOW (ref 39.0–52.0)
HCT: 20 % — ABNORMAL LOW (ref 39.0–52.0)
HCT: 22 % — ABNORMAL LOW (ref 39.0–52.0)
HCT: 24 % — ABNORMAL LOW (ref 39.0–52.0)
Hemoglobin: 7.1 g/dL — ABNORMAL LOW (ref 13.0–17.0)
Hemoglobin: 6.8 g/dL — CL (ref 13.0–17.0)
Hemoglobin: 7.5 g/dL — ABNORMAL LOW (ref 13.0–17.0)
Hemoglobin: 8.2 g/dL — ABNORMAL LOW (ref 13.0–17.0)
O2 Saturation: 98 %
O2 Saturation: 99 %
O2 Saturation: 100 %
O2 Saturation: 100 %
Patient temperature: 37.7
Patient temperature: 37.6
Patient temperature: 37.9
Patient temperature: 37.8
Potassium: 5 mmol/L (ref 3.5–5.1)
Potassium: 4.8 mmol/L (ref 3.5–5.1)
Potassium: 5.2 mmol/L — ABNORMAL HIGH (ref 3.5–5.1)
Potassium: 5.5 mmol/L — ABNORMAL HIGH (ref 3.5–5.1)
Sodium: 140 mmol/L (ref 135–145)
Sodium: 141 mmol/L (ref 135–145)
Sodium: 141 mmol/L (ref 135–145)
Sodium: 139 mmol/L (ref 135–145)
TCO2: 16 mmol/L — ABNORMAL LOW (ref 22–32)
TCO2: 17 mmol/L — ABNORMAL LOW (ref 22–32)
TCO2: 20 mmol/L — ABNORMAL LOW (ref 22–32)
TCO2: 18 mmol/L — ABNORMAL LOW (ref 22–32)
pCO2 arterial: 33.1 mmHg (ref 32.0–48.0)
pCO2 arterial: 22 mmHg — ABNORMAL LOW (ref 32.0–48.0)
pCO2 arterial: 39.1 mmHg (ref 32.0–48.0)
pCO2 arterial: 39.1 mmHg (ref 32.0–48.0)
pH, Arterial: 7.261 — ABNORMAL LOW (ref 7.350–7.450)
pH, Arterial: 7.483 — ABNORMAL HIGH (ref 7.350–7.450)
pH, Arterial: 7.301 — ABNORMAL LOW (ref 7.350–7.450)
pH, Arterial: 7.253 — ABNORMAL LOW (ref 7.350–7.450)
pO2, Arterial: 116 mmHg — ABNORMAL HIGH (ref 83.0–108.0)
pO2, Arterial: 135 mmHg — ABNORMAL HIGH (ref 83.0–108.0)
pO2, Arterial: 442 mmHg — ABNORMAL HIGH (ref 83.0–108.0)
pO2, Arterial: 258 mmHg — ABNORMAL HIGH (ref 83.0–108.0)

## 2018-11-23 LAB — POCT ACTIVATED CLOTTING TIME
Activated Clotting Time: 175 seconds
Activated Clotting Time: 180 seconds
Activated Clotting Time: 180 seconds
Activated Clotting Time: 175 seconds
Activated Clotting Time: 169 seconds
Activated Clotting Time: 153 seconds
Activated Clotting Time: 147 seconds
Activated Clotting Time: 153 seconds
Activated Clotting Time: 158 seconds
Activated Clotting Time: 164 seconds
Activated Clotting Time: 169 seconds
Activated Clotting Time: 169 seconds
Activated Clotting Time: 153 seconds
Activated Clotting Time: 158 seconds

## 2018-11-23 LAB — GLUCOSE, CAPILLARY
Glucose-Capillary: 196 mg/dL — ABNORMAL HIGH (ref 70–99)
Glucose-Capillary: 164 mg/dL — ABNORMAL HIGH (ref 70–99)
Glucose-Capillary: 152 mg/dL — ABNORMAL HIGH (ref 70–99)
Glucose-Capillary: 150 mg/dL — ABNORMAL HIGH (ref 70–99)
Glucose-Capillary: 185 mg/dL — ABNORMAL HIGH (ref 70–99)

## 2018-11-23 LAB — POCT I-STAT, CHEM 8
BUN: >140 mg/dL — ABNORMAL HIGH (ref 8–23)
Calcium, Ion: 1.04 mmol/L — ABNORMAL LOW (ref 1.15–1.40)
Chloride: 112 mmol/L — ABNORMAL HIGH (ref 98–111)
Creatinine, Ser: 4.2 mg/dL — ABNORMAL HIGH (ref 0.61–1.24)
Glucose, Bld: 139 mg/dL — ABNORMAL HIGH (ref 70–99)
HCT: 21 % — ABNORMAL LOW (ref 39.0–52.0)
Hemoglobin: 7.1 g/dL — ABNORMAL LOW (ref 13.0–17.0)
Potassium: 5 mmol/L (ref 3.5–5.1)
Sodium: 139 mmol/L (ref 135–145)
TCO2: 16 mmol/L — ABNORMAL LOW (ref 22–32)

## 2018-11-23 LAB — CBC
HCT: 23.4 % — ABNORMAL LOW (ref 39.0–52.0)
Hemoglobin: 8 g/dL — ABNORMAL LOW (ref 13.0–17.0)
MCH: 29.1 pg (ref 26.0–34.0)
MCHC: 34.2 g/dL (ref 30.0–36.0)
MCV: 85.1 fL (ref 80.0–100.0)
Platelets: 78 10*3/uL — ABNORMAL LOW (ref 150–400)
RBC: 2.75 MIL/uL — ABNORMAL LOW (ref 4.22–5.81)
RDW: 23.3 % — ABNORMAL HIGH (ref 11.5–15.5)
WBC: 33 10*3/uL — ABNORMAL HIGH (ref 4.0–10.5)
nRBC: 8.3 % — ABNORMAL HIGH (ref 0.0–0.2)

## 2018-11-23 LAB — BASIC METABOLIC PANEL
Anion gap: 14 (ref 5–15)
BUN: 147 mg/dL — ABNORMAL HIGH (ref 8–23)
CO2: 16 mmol/L — ABNORMAL LOW (ref 22–32)
Calcium: 7.3 mg/dL — ABNORMAL LOW (ref 8.9–10.3)
Chloride: 110 mmol/L (ref 98–111)
Creatinine, Ser: 3.7 mg/dL — ABNORMAL HIGH (ref 0.61–1.24)
GFR calc Af Amer: 18 mL/min — ABNORMAL LOW (ref >60)
GFR calc non Af Amer: 16 mL/min — ABNORMAL LOW (ref >60)
Glucose, Bld: 181 mg/dL — ABNORMAL HIGH (ref 70–99)
Potassium: 4.7 mmol/L (ref 3.5–5.1)
Sodium: 140 mmol/L (ref 135–145)

## 2018-11-23 LAB — CULTURE, BLOOD (SINGLE)
Culture: NO GROWTH
Special Requests: ADEQUATE

## 2018-11-23 LAB — COOXEMETRY PANEL
Carboxyhemoglobin: 1.8 % — ABNORMAL HIGH (ref 0.5–1.5)
Methemoglobin: 1.9 % — ABNORMAL HIGH (ref 0.0–1.5)
O2 Saturation: 65.3 %
Total hemoglobin: 8.1 g/dL — ABNORMAL LOW (ref 12.0–16.0)

## 2018-11-23 LAB — PREPARE RBC (CROSSMATCH)

## 2018-11-23 MED ORDER — FENTANYL CITRATE (PF) 100 MCG/2ML IJ SOLN
INTRAMUSCULAR | Status: AC
  Administered 2018-11-23: 11:00:00 50 ug
  Filled 2018-11-23: qty 2

## 2018-11-23 MED ORDER — MIDAZOLAM HCL 2 MG/2ML IJ SOLN
INTRAMUSCULAR | Status: AC
  Administered 2018-11-23: 12:00:00 1 mg
  Filled 2018-11-23: qty 2

## 2018-11-23 MED ORDER — SODIUM CHLORIDE 0.9 % IV SOLN
2.0000 g | INTRAVENOUS | Status: DC
  Administered 2018-11-23: 13:00:00 2 g via INTRAVENOUS
  Filled 2018-11-23: qty 2

## 2018-11-23 MED ORDER — STERILE WATER FOR INJECTION IV SOLN
INTRAVENOUS | Status: DC
  Filled 2018-11-23: qty 850

## 2018-11-23 MED ORDER — FENTANYL 2500MCG IN NS 250ML (10MCG/ML) PREMIX INFUSION: 0.0000 ug/h | INTRAVENOUS | Status: DC

## 2018-11-23 MED ORDER — ATORVASTATIN CALCIUM 80 MG PO TABS
80.0000 mg | ORAL_TABLET | Freq: Every day | ORAL | Status: DC
  Administered 2018-11-23: 17:00:00 80 mg
  Filled 2018-11-23: qty 1

## 2018-11-23 MED ORDER — ACETAMINOPHEN 325 MG PO TABS
650.0000 mg | ORAL_TABLET | Freq: Four times a day (QID) | ORAL | Status: DC | PRN
  Administered 2018-11-23: 17:00:00 650 mg
  Filled 2018-11-23: qty 2

## 2018-11-23 MED ORDER — ASPIRIN 81 MG PO CHEW
81.0000 mg | CHEWABLE_TABLET | Freq: Every day | ORAL | Status: DC
  Administered 2018-11-23: 17:00:00 81 mg

## 2018-11-23 MED ORDER — LORATADINE 10 MG PO TABS
10.0000 mg | ORAL_TABLET | Freq: Every day | ORAL | Status: DC
  Administered 2018-11-23: 17:00:00 10 mg

## 2018-11-24 LAB — TYPE AND SCREEN
ABO/RH(D): AB POS
Antibody Screen: NEGATIVE
Unit division: 0
Unit division: 0
Unit division: 0

## 2018-11-24 LAB — BPAM RBC
Blood Product Expiration Date: 202006102359
Blood Product Expiration Date: 202006102359
Blood Product Expiration Date: 202006142359
ISSUE DATE / TIME: 202005290023
ISSUE DATE / TIME: 202005290352
ISSUE DATE / TIME: 202005301035
Unit Type and Rh: 6200
Unit Type and Rh: 6200
Unit Type and Rh: 6200

## 2018-11-25 NOTE — Progress Notes (Signed)
Discharge Progress Report  Patient Details  Name: Eric Spencer MRN: [id number] Date of Birth: 1945-07-18 Referring Provider:     PULMONARY REHAB COPD ORIENTATION from 08/01/2018 in [place]  Referring Provider  Eric Spencer       Number of Visits: 11  Reason for Discharge:  Early Exit:  Department was closed due to COVID-19 restrictions and patient expired during closure.   Smoking History:  Social History   Tobacco Use  Smoking Status Former Smoker  . Packs/day: 1.50  . Years: 48.00  . Pack years: 72.00  . Types: Cigarettes  . Last attempt to quit: 10/28/2015  . Years since quitting: 3.0  Smokeless Tobacco Never Used    Diagnosis:  COPD mixed type ([place])  ADL UCSD: Pulmonary Assessment Scores    Row Name 08/01/18 1145         ADL UCSD   ADL Phase  Entry     SOB Score total  69     Rest  0     Walk  8     Stairs  4     Bath  3     Dress  3     Shop  3       CAT Score   CAT Score  23       mMRC Score   mMRC Score  3        Initial Exercise Prescription: Initial Exercise Prescription - 08/01/18 0900      Date of Initial Exercise RX and Referring Provider   Date  08/01/18    Referring Provider  Eric Spencer    Expected Discharge Date  10/30/18      Oxygen   Oxygen  Continuous    Liters  2   as needed while walking      Treadmill   MPH  0.8    Grade  0    Minutes  17    METs  1.61      Recumbant Elliptical   Level  1    RPM  26    Watts  20    Minutes  17    METs  1      T5 Nustep   Level  1    SPM  40    Minutes  17    METs  1.5      Prescription Details   Frequency (times per week)  2    Duration  Progress to 30 minutes of continuous aerobic without signs/symptoms of physical distress      Intensity   THRR 40-80% of Max Heartrate  [phone]    Ratings of Perceived Exertion  11-13    Perceived Dyspnea  0-4      Progression   Progression  Continue to progress workloads to maintain intensity without  signs/symptoms of physical distress.      Resistance Training   Training Prescription  Yes    Weight  1    Reps  10-15       Discharge Exercise Prescription (Final Exercise Prescription Changes): Exercise Prescription Changes - 09/16/18 1000      Response to Exercise   Blood Pressure (Admit)  120/68    Blood Pressure (Exercise)  122/60    Blood Pressure (Exit)  108/64    Heart Rate (Admit)  83 bpm    Heart Rate (Exercise)  89 bpm    Heart Rate (Exit)  75 bpm    Oxygen Saturation (Admit)  93 %    Oxygen Saturation (Exercise)  93 %    Oxygen Saturation (Exit)  96 %    Rating of Perceived Exertion (Exercise)  11    Perceived Dyspnea (Exercise)  11    Duration  Continue with 30 min of aerobic exercise without signs/symptoms of physical distress.    Intensity  THRR unchanged      Resistance Training   Training Prescription  Yes    Weight  2    Reps  10-15      Treadmill   MPH  0.9    Grade  0    Minutes  17    METs  1.69      Recumbant Elliptical   Level  1    RPM  47    Watts  18    Minutes  22    METs  1      Home Exercise Plan   Plans to continue exercise at  Home (comment)    Frequency  Add 3 additional days to program exercise sessions.    Initial Home Exercises Provided  08/01/18       Functional Capacity: 6 Minute Walk    Row Name 08/01/18 0936         6 Minute Walk   Phase  Initial     Distance  700 feet     Walk Time  5.06 minutes     # of Rest Breaks  1     MPH  1.32     METS  2.02     RPE  15     Perceived Dyspnea   14     VO2 Peak  8.68     Symptoms  Yes (comment)     Comments  SOB - had to stop and put O2 on, leg tiredness due to deconditioning.      Resting HR  105 bpm     Resting BP  130/68     Resting Oxygen Saturation   98 %     Exercise Oxygen Saturation  during 6 min walk  86 % after O2 - 94     Max Ex. HR  118 bpm     Max Ex. BP  152/62     2 Minute Post BP  132/70        Psychological, QOL, Others - Outcomes: PHQ  2/9: Depression screen [facility] 2/9 08/01/2018 10/18/2017 07/11/2017 05/23/2017 02/19/2017  Decreased Interest 0 0 0 2 0  Down, Depressed, Hopeless 0 0 0 1 2  PHQ - 2 Score 0 0 0 3 2  Altered sleeping 0 - 0 0 0  Tired, decreased energy 1 - 0 1 1  Change in appetite 2 - 0 0 0  Feeling bad or failure about yourself  0 - 0 1 1  Trouble concentrating 0 - 0 0 0  Moving slowly or fidgety/restless 0 - 0 0 1  Suicidal thoughts 0 - 0 0 0  PHQ-9 Score 3 - 0 5 5  Difficult doing work/chores Somewhat difficult - Not difficult at all Somewhat difficult Somewhat difficult    Quality of Life: Quality of Life - 08/01/18 0943      Quality of Life   Select  Quality of Life      Quality of Life Scores   Health/Function Pre  9.2 %    Socioeconomic Pre  29 %    Psych/Spiritual Pre  16.29 %    Family Pre  27.6 %    GLOBAL Pre  17.09 %       Personal Goals: Goals established at orientation with interventions provided to work toward goal. Personal Goals and Risk Factors at Admission - 08/01/18 1151      Core Components/Risk Factors/Patient Goals on Admission    Weight Management  Weight Maintenance    Personal Goal Other  Yes    Personal Goal  Patient wants to breathe better, get stronger, be able to sing on the choir.     Intervention  Attend PR 2 x week and supplement with at home exercise 3 x week.     Expected Outcomes  Reach personal goals        Personal Goals Discharge: Goals and Risk Factor Review    Row Name 08/29/18 1308 09/16/18 1409           Core Components/Risk Factors/Patient Goals Review   Personal Goals Review  Weight Management/Obesity;Improve shortness of breath with ADL's Breathe better; get stronger; be able to sing in choir.   Weight Management/Obesity;Improve shortness of breath with ADL's Breathe better; get stronger; be able to sing in choir.       Review  Patient has completed 8 sessions maintaining his weight since his orientation visit. He is doing well in the program  with some progression. He states his breathing has improved and he is now able to shower without having to use his MDI multiple times. He also states he is ablet to put out his trash now which he has not been able to do in a long time. He is very pleased with his progress so far. Will continue to monitor for progress.   Patient has completed 11 sessions gaining 8 lbs since last 30 day review. He continues to do well in the program with progression. He continues to say he is breathing better and using his inhaler less now. He is able to do more things around the house with improved SOB. Outpatient pulmonary rehab services was suspended 09/09/18 due to COVID-19 restrictions. Patient says he plans to buy a treadmill to keep walking and exercising while he is out. Will continue to monitor.       Expected Outcomes  Patient will continue to attend sessions and complete the program meeting his personal goals.   Patient will continue to attend sessions and complete the program meeting his personal goals.          Exercise Goals and Review: Exercise Goals    Row Name 08/01/18 0941             Exercise Goals   Increase Physical Activity  Yes       Intervention  Provide advice, education, support and counseling about physical activity/exercise needs.;Develop an individualized exercise prescription for aerobic and resistive training based on initial evaluation findings, risk stratification, comorbidities and participant's personal goals.       Expected Outcomes  Short Term: Attend rehab on a regular basis to increase amount of physical activity.       Increase Strength and Stamina  Yes       Intervention  Provide advice, education, support and counseling about physical activity/exercise needs.;Develop an individualized exercise prescription for aerobic and resistive training based on initial evaluation findings, risk stratification, comorbidities and participant's personal goals.       Expected Outcomes  Short  Term: Increase workloads from initial exercise prescription for resistance, speed, and METs.       Able to understand and  use rate of perceived exertion (RPE) scale  Yes       Intervention  Provide education and explanation on how to use RPE scale       Expected Outcomes  Short Term: Able to use RPE daily in rehab to express subjective intensity level;Long Term:  Able to use RPE to guide intensity level when exercising independently       Able to understand and use Dyspnea scale  Yes       Intervention  Provide education and explanation on how to use Dyspnea scale       Expected Outcomes  Long Term: Able to use Dyspnea scale to guide intensity level when exercising independently;Short Term: Able to use Dyspnea scale daily in rehab to express subjective sense of shortness of breath during exertion       Knowledge and understanding of Target Heart Rate Range (THRR)  Yes       Intervention  Provide education and explanation of THRR including how the numbers were predicted and where they are located for reference       Expected Outcomes  Short Term: Able to state/look up THRR;Short Term: Able to use daily as guideline for intensity in rehab;Long Term: Able to use THRR to govern intensity when exercising independently       Able to check pulse independently  Yes       Intervention  Provide education and demonstration on how to check pulse in carotid and radial arteries.;Review the importance of being able to check your own pulse for safety during independent exercise       Expected Outcomes  Short Term: Able to explain why pulse checking is important during independent exercise;Long Term: Able to check pulse independently and accurately       Understanding of Exercise Prescription  Yes       Intervention  Provide education, explanation, and written materials on patient's individual exercise prescription       Expected Outcomes  Short Term: Able to explain program exercise prescription;Long Term: Able to  explain home exercise prescription to exercise independently          Exercise Goals Re-Evaluation: Exercise Goals Re-Evaluation    Row Name 08/28/18 1504 09/16/18 1100           Exercise Goal Re-Evaluation   Exercise Goals Review  Increase Physical Activity;Increase Strength and Stamina;Able to understand and use rate of perceived exertion (RPE) scale;Able to understand and use Dyspnea scale;Knowledge and understanding of Target Heart Rate Range (THRR);Able to check pulse independently;Understanding of Exercise Prescription  Increase Physical Activity;Increase Strength and Stamina;Able to understand and use rate of perceived exertion (RPE) scale;Able to understand and use Dyspnea scale;Knowledge and understanding of Target Heart Rate Range (THRR);Able to check pulse independently;Understanding of Exercise Prescription      Comments  Pt. is still fairly new to the program having attended 8 sessions so far. He has reported feeling stronger since starting PR and is excited when he is able to increase his speed on the TM.  Pt. is doing well in PR. He has attended 11 exercise sesssions. He pushes himself everyday to walk longer on the TM without taking a break. He reports being able to do more around the house since starting the program.       Expected Outcomes  increase strength.   increase strength.          Nutrition & Weight - Outcomes: Pre Biometrics - 08/01/18 4497  Pre Biometrics   Height  '6\' 2"'  (1.88 m)    Waist Circumference  39 inches    Hip Circumference  40 inches    Waist to Hip Ratio  0.98 %    Triceps Skinfold  7 mm    % Body Fat  22.7 %    Grip Strength  7.4 kg    Flexibility  0 in   cracked vertebrae    Single Leg Stand  6 seconds        Nutrition: Nutrition Therapy & Goals - 08/29/18 1258      Personal Nutrition Goals   Nutrition Goal  For heart healthy choices add >50% of whole grains, make half their plate fruits and vegetables. Discuss the difference  between starchy vegetables and leafy greens, and how leafy vegetables provide fiber, helps maintain healthy weight, helps control blood glucose, and lowers cholesterol.  Discuss purchasing fresh or frozen vegetable to reduce sodium and not to add grease, fat or sugar. Consume <18oz of red meat per week. Consume lean cuts of meats and very little of meats high in sodium and nitrates such as pork and lunch meats. Discussed portion control for all food groups.    Comments  Patient met with RD 08/29/18.       Nutrition Discharge: Nutrition Assessments - 08/01/18 1150      MEDFICTS Scores   Pre Score  74       Education Questionnaire Score: Knowledge Questionnaire Score - 08/01/18 1151      Knowledge Questionnaire Score   Pre Score  14/18

## 2018-11-25 NOTE — Progress Notes (Signed)
Patient's upper and lower dentures, goldtone ring and clothing sent home with his daughter, Mardella Layman. Wife and sister at the bedside.

## 2018-11-25 NOTE — Addendum Note (Signed)
Encounter addended by: Suann Larry, RN on: 11/25/2018 12:45 PM  Actions taken: Clinical Note Signed, Episode resolved

## 2018-11-25 NOTE — Procedures (Signed)
Intubation Procedure Note Eric Spencer [id number] 06-Aug-1945  Procedure: Intubation Indications: Airway protection and maintenance  Procedure Details Consent: Risks of procedure as well as the alternatives and risks of each were explained to the (patient/caregiver).  Consent for procedure obtained. Time Out: Verified patient identification, verified procedure, site/side was marked, verified correct patient position, special equipment/implants available, medications/allergies/relevent history reviewed, required imaging and test results available.  Performed  Maximum sterile technique was used including antiseptics, cap, gloves, gown, hand hygiene, mask and sheet.  MAC and 3    Evaluation Hemodynamic Status: BP stable throughout; O2 sats: stable throughout Patient's Current Condition: stable Complications: No apparent complications Patient did tolerate procedure well. Chest X-ray ordered to verify placement.  CXR: pending.  Pt intubated by Dr. Loanne Drilling x1 attempt.    Sharla Kidney 12/21/2018

## 2018-11-25 NOTE — Progress Notes (Signed)
  Discussed results of CT scan with Radiology has ischemic segment of small bowel with extensive pneumotosis.   Now back on vent and dual pressors.   I have discussed case with Dr. Donell Beers in [facility] for possible operative intervention. He is very high risk with multiple comorbidities. They will evaluate for candidacy for possible operative intervention.  I have notified his wife.   Additional CCT 35 mins.   Arvilla Meres, MD  12:57 PM

## 2018-11-25 NOTE — Progress Notes (Signed)
Pt transported on ventilator with RN from 2H14 to CT1 and back. Pt tolerated well. Vital signs stable throughout.

## 2018-11-25 NOTE — Procedures (Signed)
Intubation Procedure Note Eric Spencer [id number] 1946-02-08  Procedure: Intubation Indications: Respiratory insufficiency  Procedure Details Consent: Risks of procedure as well as the alternatives and risks of each were explained to the (patient/caregiver).  Consent for procedure obtained. Time Out: Verified patient identification, verified procedure, site/side was marked, verified correct patient position, special equipment/implants available, medications/allergies/relevent history reviewed, required imaging and test results available.  Performed  Maximum sterile technique was used including gloves, hand hygiene and mask.  MAC and 3  Grade II view with edematous airway and epiglottis. ETT visualized passing between vocal cords Fentanyl 50 mcg, Versed 1 mg, Etomidate 20 mg, Roc 100 mg  Evaluation Hemodynamic Status: Persistent hypotension treated with pressors; O2 sats: currently acceptable Patient's Current Condition: unstable Complications: No apparent complications Patient did tolerate procedure well. Chest X-ray ordered to verify placement.  CXR: pending.   Eric Spencer December 19, 2018

## 2018-11-25 NOTE — Progress Notes (Signed)
   Patient continues to deteriorate. Now on max dose NE at VP.   SBP in 70s.   Wife and sister at bedside. They are very concerned about his comfort.   Will increase Fentanyl to 150/hr and add Versed at 2/hr. Titrate as needed for pain.   They realize he will likely not survive the evening and they just want him to be comfortable.   No further escalation of care. No CPR or code meds.   Additional CCT 40 mins.   Arvilla Meres, MD  5:44 PM

## 2018-11-25 NOTE — Progress Notes (Signed)
Pharmacy Antibiotic Note  Eric Spencer is a 73 y.o. male admitted on 11/19/2018 with worsening clinical status, being treated with azithromycin for pneumonia. Patient became hypotensive overnight.  Pharmacy has been consulted for cefepime dosing. Tmax 100, WBC remain elevated. SCr 4.20, in AKI. Considerations for CVVHD.    Plan: Cefepime 2g IV q24h Continue azithromycin 500 mg IV daily Monitor clinical status, renal function, length of therapy  Height: 6\' 2"  (188 cm) Weight: 197 lb 12 oz (89.7 kg) IBW/kg (Calculated) : 82.2  Temp (24hrs), Avg:99.4 F (37.4 C), Min:98.6 F (37 C), Max:100 F (37.8 C)  Recent Labs  Lab 10/31/2018 0453 11/09/2018 1616  11/18/18 0220 11/19/18 0000  11/20/18 0330  11/21/18 0343 11/21/18 2256 11/22/18 0229 11/22/18 1315 12/06/18 0313 12-06-18 1008  WBC 27.1*  --    < > 25.9*  --    < > 23.7*   < > 21.8* 25.9* 30.1* 30.4* 33.0*  --   CREATININE 2.56*  --   --  3.42*  --    < > 3.22*  --  3.23*  --  3.17*  --  3.70* 4.20*  LATICACIDVEN 3.7* 3.6*  --  1.5 1.1  --   --   --   --   --   --   --   --   --    < > = values in this interval not displayed.    Estimated Creatinine Clearance: 18.8 mL/min (A) (by C-G formula based on SCr of 4.2 mg/dL (H)).    Allergies  Allergen Reactions  . Penicillins Other (See Comments)    Did it involve swelling of the face/tongue/throat, SOB, or low BP? unknown Did it involve sudden or severe rash/hives, skin peeling, or any reaction on the inside of your mouth or nose? Unknown Did you need to seek medical attention at a hospital or doctor's office? Unknown When did it last happen? childhood If all above answers are "NO", may proceed with cephalosporin use.   Childhood allergy- unaware of reaction    Antimicrobials this admission: Cefepime 5/25>>5/26; 5/30 >> Doxy PO 5/22>> 5/25 (COPD exab) Azithro 5/26>> (10d)  Dose adjustments this admission:   Microbiology results: 5/25 blood x1: ngtd 5/25  urine:ng 5/23 blood x 2 - ng 5/22 COVID - negative 5/25 legionella - pos  Thank you for allowing pharmacy to be a part of this patient's care.  Marcelino Freestone, PharmD PGY2 Cardiology Pharmacy Resident Please check AMION for all Pharmacist numbers by unit 2018-12-06 11:31 AM

## 2018-11-25 NOTE — Progress Notes (Signed)
OT Cancellation Note  Patient Details Name: Eric Spencer MRN: [id number] DOB: August 16, 1945   Cancelled Treatment:    Reason Eval/Treat Not Completed: Patient not medically ready. Pt reintubated and on pressors. Signing off, please reorder as appropriate.  Evern Bio 10/31/2018, 2:53 PM  Martie Round, OTR/L Acute Rehabilitation Services Pager: [phone] Office: [phone]

## 2018-11-25 NOTE — Consult Note (Addendum)
Reason for Consult: ischemic bowel  Referring Physician: Bensimhon  Eric Spencer is an 73 y.o. male.  HPI:  Patient is a 73 year old male who I am asked to see by Dr. Haroldine Laws for potential ischemic bowel.  The patient has gold 4 COPD and had increasing shortness of breath despite using his inhalers.  He came in on 23 May with chest pain for 2 days that waxed and waned.  He was originally felt to have an NSTEMI.  He eventually worsened and decompensated.  He received cardiac catheterization with Impella ventricular assist device.  He also has been in atrial fibrillation.  He developed a Legionella pneumonia.  He developed anuric renal failure.  He required intubation.  He had stress dose steroids.  He has been on and off pressors.  Pressors were weaned to off until last night.  He also had been extubated.  He has still been requiring support from the assist device.  He also had a bad night yesterday with bleeding from his groin site.  His Impella was repositioned and resutured.  This morning he also was complaining of abdominal pain.  His mental status decreased as well as his blood pressure.  He got reintubated.  He required increase in his levo fed and a small bolus.  CT was performed which showed a region of small bowel with pneumatosis. We were consulted to evaluate.     Past Medical History:  Diagnosis Date  . Arthritis   . Asthma   . COPD (chronic obstructive pulmonary disease) ([place])   . Hypertension     Past Surgical History:  Procedure Laterality Date  . ADENOIDECTOMY    . HERNIA REPAIR    . RIGHT/LEFT HEART CATH AND CORONARY/GRAFT ANGIOGRAPHY N/A 10/29/2018   Procedure: RIGHT/LEFT HEART CATH AND CORONARY/GRAFT ANGIOGRAPHY;  Surgeon: Lorretta Harp, MD;  Location: [facility];  Service: Cardiovascular;  Laterality: N/A;  . TONSILLECTOMY    . VENTRICULAR ASSIST DEVICE INSERTION N/A 11/01/2018   Procedure: VENTRICULAR ASSIST DEVICE INSERTION;  Surgeon: Lorretta Harp, MD;  Location: [facility];  Service: Cardiovascular;  Laterality: N/A;    Family History  Problem Relation Age of Onset  . Hypertension Mother   . Arthritis Father   . Stroke Father   . Diabetes Maternal Grandmother   . Allergic rhinitis Neg Hx   . Asthma Neg Hx   . Eczema Neg Hx   . Angioedema Neg Hx     Social History:  reports that he quit smoking about 3 years ago. His smoking use included cigarettes. He has a 72.00 pack-year smoking history. He has never used smokeless tobacco. He reports that he does not drink alcohol or use drugs.  Allergies:  Allergies  Allergen Reactions  . Penicillins Other (See Comments)    Did it involve swelling of the face/tongue/throat, SOB, or low BP? unknown Did it involve sudden or severe rash/hives, skin peeling, or any reaction on the inside of your mouth or nose? Unknown Did you need to seek medical attention at a hospital or doctor's office? Unknown When did it last happen? childhood If all above answers are "NO", may proceed with cephalosporin use.   Childhood allergy- unaware of reaction    Medications: I have reviewed the patient's current medications.  Results for orders placed or performed during the hospital encounter of 11/06/2018 (from the past 48 hour(s))  Glucose, capillary     Status: Abnormal   Collection Time: 11/21/18  3:55  PM  Result Value Ref Range   Glucose-Capillary 194 (H) 70 - 99 mg/dL  POCT Activated clotting time     Status: None   Collection Time: 11/21/18  3:56 PM  Result Value Ref Range   Activated Clotting Time 186 seconds  POCT Activated clotting time     Status: None   Collection Time: 11/21/18  6:15 PM  Result Value Ref Range   Activated Clotting Time 180 seconds  Glucose, capillary     Status: Abnormal   Collection Time: 11/21/18  7:58 PM  Result Value Ref Range   Glucose-Capillary 153 (H) 70 - 99 mg/dL  POCT Activated clotting time     Status: None   Collection Time: 11/21/18  8:00  PM  Result Value Ref Range   Activated Clotting Time 180 seconds  POCT Activated clotting time     Status: None   Collection Time: 11/21/18 10:16 PM  Result Value Ref Range   Activated Clotting Time 180 seconds  Type and screen [place]     Status: None (Preliminary result)   Collection Time: 11/21/18 10:50 PM  Result Value Ref Range   ABO/RH(D) AB POS    Antibody Screen NEG    Sample Expiration 11/24/2018,2359    Unit Number [id number]    Blood Component Type RED CELLS,LR    Unit division 00    Status of Unit ISSUED    Transfusion Status OK TO TRANSFUSE    Crossmatch Result Compatible    Unit Number [id number]    Blood Component Type RED CELLS,LR    Unit division 00    Status of Unit ISSUED    Transfusion Status OK TO TRANSFUSE    Crossmatch Result Compatible    Unit Number [id number]    Blood Component Type RED CELLS,LR    Unit division 00    Status of Unit ISSUED    Transfusion Status OK TO TRANSFUSE    Crossmatch Result      Compatible Performed at [facility], [place] [address], [place], [place] [address]   Prepare RBC     Status: None   Collection Time: 11/21/18 10:50 PM  Result Value Ref Range   Order Confirmation      ORDER PROCESSED BY BLOOD BANK Performed at [facility], [place] [address], [place], [place] [address]   ABO/Rh     Status: None   Collection Time: 11/21/18 10:50 PM  Result Value Ref Range   ABO/RH(D)      AB POS Performed at [facility] [address] [place], [place] [address]   CBC     Status: Abnormal   Collection Time: 11/21/18 10:56 PM  Result Value Ref Range   WBC 25.9 (H) 4.0 - 10.5 K/uL   RBC 2.48 (L) 4.22 - 5.81 MIL/uL   Hemoglobin 7.4 (L) 13.0 - 17.0 g/dL   HCT 23.5 (L) 39.0 - 52.0 %   MCV 94.8 80.0 - 100.0 fL   MCH 29.8 26.0 - 34.0 pg   MCHC 31.5 30.0 - 36.0 g/dL   RDW 17.2 (H) 11.5 - 15.5 %   Platelets 102 (L) 150 - 400 K/uL    Comment: REPEATED TO VERIFY Immature Platelet  Fraction may be clinically indicated, consider ordering this additional test [id number] CONSISTENT WITH PREVIOUS RESULT    nRBC 3.7 (H) 0.0 - 0.2 %    Comment: Performed at [facility], [place] [address], [place], [place] [address]  Pathologist smear review     Status: None   Collection Time: 11/21/18 10:56 PM  Result Value Ref Range   Path Review Normocytic anemia with circulating NRBC     Comment: Thrombocytopenia. Leukocytosis with mild maturational left shift. Reviewed by Marlynn Perking. Melina Copa, M.D. 11/22/2018 Performed at [facility], [place] [address] [place], [place] [address]   Glucose, capillary     Status: Abnormal   Collection Time: 11/21/18 11:23 PM  Result Value Ref Range   Glucose-Capillary 180 (H) 70 - 99 mg/dL  POCT Activated clotting time     Status: None   Collection Time: 11/21/18 11:24 PM  Result Value Ref Range   Activated Clotting Time 175 seconds  POCT Activated clotting time     Status: None   Collection Time: 11/22/18  1:01 AM  Result Value Ref Range   Activated Clotting Time 180 seconds  Lactate dehydrogenase     Status: Abnormal   Collection Time: 11/22/18  2:29 AM  Result Value Ref Range   LDH 877 (H) 98 - 192 U/L    Comment: Performed at [facility], [place] [address], [place], [place] [address]  Basic metabolic panel     Status: Abnormal   Collection Time: 11/22/18  2:29 AM  Result Value Ref Range   Sodium 140 135 - 145 mmol/L   Potassium 4.9 3.5 - 5.1 mmol/L   Chloride 110 98 - 111 mmol/L   CO2 20 (L) 22 - 32 mmol/L   Glucose, Bld 236 (H) 70 - 99 mg/dL   BUN 118 (H) 8 - 23 mg/dL   Creatinine, Ser 3.17 (H) 0.61 - 1.24 mg/dL   Calcium 7.4 (L) 8.9 - 10.3 mg/dL   GFR calc non Af Amer 19 (L) >60 mL/min   GFR calc Af Amer 22 (L) >60 mL/min   Anion gap 10 5 - 15    Comment: Performed at [facility] [address], [place], [place] [address]  CBC     Status: Abnormal   Collection Time: 11/22/18  2:29 AM  Result Value Ref Range    WBC 30.1 (H) 4.0 - 10.5 K/uL   RBC 3.00 (L) 4.22 - 5.81 MIL/uL   Hemoglobin 8.3 (L) 13.0 - 17.0 g/dL   HCT 26.6 (L) 39.0 - 52.0 %   MCV 88.7 80.0 - 100.0 fL   MCH 27.7 26.0 - 34.0 pg   MCHC 31.2 30.0 - 36.0 g/dL   RDW 22.7 (H) 11.5 - 15.5 %   Platelets 108 (L) 150 - 400 K/uL    Comment: REPEATED TO VERIFY SPECIMEN CHECKED FOR CLOTS Immature Platelet Fraction may be clinically indicated, consider ordering this additional test [id number] CONSISTENT WITH PREVIOUS RESULT    nRBC 3.8 (H) 0.0 - 0.2 %    Comment: Performed at [facility], [place] [address] [place], [place] [address]  Magnesium     Status: Abnormal   Collection Time: 11/22/18  2:29 AM  Result Value Ref Range   Magnesium 2.6 (H) 1.7 - 2.4 mg/dL    Comment: Performed at [facility] [address] [place], [place] [address]  Phosphorus     Status: Abnormal   Collection Time: 11/22/18  2:29 AM  Result Value Ref Range   Phosphorus 4.9 (H) 2.5 - 4.6 mg/dL    Comment: Performed at [facility] [address] [place], [place] [address]  .Cooxemetry Panel (carboxy, met, total hgb, O2 sat)  Status: Abnormal   Collection Time: 11/22/18  2:40 AM  Result Value Ref Range   Total hemoglobin 8.6 (L) 12.0 - 16.0 g/dL   O2 Saturation 45.4 %   Carboxyhemoglobin 1.2 0.5 - 1.5 %   Methemoglobin 1.8 (H) 0.0 - 1.5 %  POCT Activated clotting time     Status: None   Collection Time: 11/22/18  3:39 AM  Result Value Ref Range   Activated Clotting Time 180 seconds  .Cooxemetry Panel (carboxy, met, total hgb, O2 sat)     Status: Abnormal   Collection Time: 11/22/18  3:40 AM  Result Value Ref Range   Total hemoglobin 8.3 (L) 12.0 - 16.0 g/dL   O2 Saturation 58.3 %   Carboxyhemoglobin 1.4 0.5 - 1.5 %   Methemoglobin 1.8 (H) 0.0 - 1.5 %  Glucose, capillary     Status: Abnormal   Collection Time: 11/22/18  3:40 AM  Result Value Ref Range   Glucose-Capillary 205 (H) 70 - 99 mg/dL  Glucose, capillary     Status:  Abnormal   Collection Time: 11/22/18  7:45 AM  Result Value Ref Range   Glucose-Capillary 161 (H) 70 - 99 mg/dL  POCT Activated clotting time     Status: None   Collection Time: 11/22/18  7:45 AM  Result Value Ref Range   Activated Clotting Time 175 seconds  POCT Activated clotting time     Status: None   Collection Time: 11/22/18 10:02 AM  Result Value Ref Range   Activated Clotting Time 169 seconds  Glucose, capillary     Status: Abnormal   Collection Time: 11/22/18 12:08 PM  Result Value Ref Range   Glucose-Capillary 156 (H) 70 - 99 mg/dL  POCT Activated clotting time     Status: None   Collection Time: 11/22/18 12:11 PM  Result Value Ref Range   Activated Clotting Time 153 seconds  CBC with Differential/Platelet     Status: Abnormal   Collection Time: 11/22/18  1:15 PM  Result Value Ref Range   WBC 30.4 (H) 4.0 - 10.5 K/uL   RBC 3.08 (L) 4.22 - 5.81 MIL/uL   Hemoglobin 8.9 (L) 13.0 - 17.0 g/dL   HCT 26.8 (L) 39.0 - 52.0 %   MCV 87.0 80.0 - 100.0 fL   MCH 28.9 26.0 - 34.0 pg   MCHC 33.2 30.0 - 36.0 g/dL   RDW 22.6 (H) 11.5 - 15.5 %   Platelets 76 (L) 150 - 400 K/uL    Comment: REPEATED TO VERIFY Immature Platelet Fraction may be clinically indicated, consider ordering this additional test [id number] CONSISTENT WITH PREVIOUS RESULT    nRBC 4.3 (H) 0.0 - 0.2 %   Neutrophils Relative % 88 %   Neutro Abs 27.0 (H) 1.7 - 7.7 K/uL   Lymphocytes Relative 3 %   Lymphs Abs 0.8 0.7 - 4.0 K/uL   Monocytes Relative 3 %   Monocytes Absolute 0.8 0.1 - 1.0 K/uL   Eosinophils Relative 0 %   Eosinophils Absolute 0.1 0.0 - 0.5 K/uL   Basophils Relative 0 %   Basophils Absolute 0.1 0.0 - 0.1 K/uL   Immature Granulocytes 6 %   Abs Immature Granulocytes 1.74 (H) 0.00 - 0.07 K/uL   Acanthocytes PRESENT    Burr Cells PRESENT    Polychromasia PRESENT     Comment: Performed at [facility], [place] [address] [place], [place] [address]  C difficile quick scan w PCR reflex      Status:  None   Collection Time: 11/22/18  4:05 PM  Result Value Ref Range   C Diff antigen NEGATIVE NEGATIVE   C Diff toxin NEGATIVE NEGATIVE   C Diff interpretation No C. difficile detected.     Comment: Performed at [facility], [place] [address], [place], [place] [address]  Glucose, capillary     Status: Abnormal   Collection Time: 11/22/18  6:07 PM  Result Value Ref Range   Glucose-Capillary 195 (H) 70 - 99 mg/dL  POCT Activated clotting time     Status: None   Collection Time: 11/22/18  6:10 PM  Result Value Ref Range   Activated Clotting Time 147 seconds  I-STAT 7, (LYTES, BLD GAS, ICA, H+H)     Status: Abnormal   Collection Time: 11/22/18  8:10 PM  Result Value Ref Range   pH, Arterial 7.261 (L) 7.350 - 7.450   pCO2 arterial 33.1 32.0 - 48.0 mmHg   pO2, Arterial 116.0 (H) 83.0 - 108.0 mmHg   Bicarbonate 14.7 (L) 20.0 - 28.0 mmol/L   TCO2 16 (L) 22 - 32 mmol/L   O2 Saturation 98.0 %   Acid-base deficit 11.0 (H) 0.0 - 2.0 mmol/L   Sodium 140 135 - 145 mmol/L   Potassium 5.0 3.5 - 5.1 mmol/L   Calcium, Ion 1.04 (L) 1.15 - 1.40 mmol/L   HCT 21.0 (L) 39.0 - 52.0 %   Hemoglobin 7.1 (L) 13.0 - 17.0 g/dL   Patient temperature 37.7 C    Collection site ARTERIAL LINE    Drawn by Operator    Sample type ARTERIAL   Glucose, capillary     Status: Abnormal   Collection Time: 11/22/18  9:47 PM  Result Value Ref Range   Glucose-Capillary 226 (H) 70 - 99 mg/dL  POCT Activated clotting time     Status: None   Collection Time: 11/22/18  9:47 PM  Result Value Ref Range   Activated Clotting Time 153 seconds  POCT Activated clotting time     Status: None   Collection Time: 11/22/18 11:18 PM  Result Value Ref Range   Activated Clotting Time 158 seconds  Glucose, capillary     Status: Abnormal   Collection Time: 12/17/18  1:35 AM  Result Value Ref Range   Glucose-Capillary 196 (H) 70 - 99 mg/dL  POCT Activated clotting time     Status: None   Collection Time: 2018/12/17  1:35 AM   Result Value Ref Range   Activated Clotting Time 164 seconds  Basic metabolic panel     Status: Abnormal   Collection Time: 17-Dec-2018  3:13 AM  Result Value Ref Range   Sodium 140 135 - 145 mmol/L   Potassium 4.7 3.5 - 5.1 mmol/L   Chloride 110 98 - 111 mmol/L   CO2 16 (L) 22 - 32 mmol/L   Glucose, Bld 181 (H) 70 - 99 mg/dL   BUN 147 (H) 8 - 23 mg/dL   Creatinine, Ser 3.70 (H) 0.61 - 1.24 mg/dL   Calcium 7.3 (L) 8.9 - 10.3 mg/dL   GFR calc non Af Amer 16 (L) >60 mL/min   GFR calc Af Amer 18 (L) >60 mL/min   Anion gap 14 5 - 15    Comment: Performed at [facility], [place] [address] [place], [place] [address]  CBC     Status: Abnormal   Collection Time: 17-Dec-2018  3:13 AM  Result Value Ref Range   WBC 33.0 (H) 4.0 - 10.5  K/uL   RBC 2.75 (L) 4.22 - 5.81 MIL/uL   Hemoglobin 8.0 (L) 13.0 - 17.0 g/dL   HCT 23.4 (L) 39.0 - 52.0 %   MCV 85.1 80.0 - 100.0 fL   MCH 29.1 26.0 - 34.0 pg   MCHC 34.2 30.0 - 36.0 g/dL   RDW 23.3 (H) 11.5 - 15.5 %   Platelets 78 (L) 150 - 400 K/uL    Comment: REPEATED TO VERIFY Immature Platelet Fraction may be clinically indicated, consider ordering this additional test [id number]    nRBC 8.3 (H) 0.0 - 0.2 %    Comment: Performed at [facility], [place] [address] [place], [place] [address]  .Cooxemetry Panel (carboxy, met, total hgb, O2 sat)     Status: Abnormal   Collection Time: 11/24/2018  3:18 AM  Result Value Ref Range   Total hemoglobin 8.1 (L) 12.0 - 16.0 g/dL   O2 Saturation 65.3 %   Carboxyhemoglobin 1.8 (H) 0.5 - 1.5 %   Methemoglobin 1.9 (H) 0.0 - 1.5 %  Glucose, capillary     Status: Abnormal   Collection Time: Nov 24, 2018  4:52 AM  Result Value Ref Range   Glucose-Capillary 164 (H) 70 - 99 mg/dL  POCT Activated clotting time     Status: None   Collection Time: Nov 24, 2018  4:53 AM  Result Value Ref Range   Activated Clotting Time 169 seconds  Glucose, capillary     Status: Abnormal   Collection Time: 2018-11-24  8:19 AM  Result  Value Ref Range   Glucose-Capillary 152 (H) 70 - 99 mg/dL  POCT Activated clotting time     Status: None   Collection Time: Nov 24, 2018  8:21 AM  Result Value Ref Range   Activated Clotting Time 169 seconds  I-STAT 7, (LYTES, BLD GAS, ICA, H+H)     Status: Abnormal   Collection Time: 2018-11-24  8:50 AM  Result Value Ref Range   pH, Arterial 7.483 (H) 7.350 - 7.450   pCO2 arterial 22.0 (L) 32.0 - 48.0 mmHg   pO2, Arterial 135.0 (H) 83.0 - 108.0 mmHg   Bicarbonate 16.4 (L) 20.0 - 28.0 mmol/L   TCO2 17 (L) 22 - 32 mmol/L   O2 Saturation 99.0 %   Acid-base deficit 6.0 (H) 0.0 - 2.0 mmol/L   Sodium 141 135 - 145 mmol/L   Potassium 4.8 3.5 - 5.1 mmol/L   Calcium, Ion 1.05 (L) 1.15 - 1.40 mmol/L   HCT 20.0 (L) 39.0 - 52.0 %   Hemoglobin 6.8 (LL) 13.0 - 17.0 g/dL   Patient temperature 37.6 C    Sample type ARTERIAL   I-STAT, chem 8     Status: Abnormal   Collection Time: 11-24-2018 10:08 AM  Result Value Ref Range   Sodium 139 135 - 145 mmol/L   Potassium 5.0 3.5 - 5.1 mmol/L   Chloride 112 (H) 98 - 111 mmol/L   BUN >140 (H) 8 - 23 mg/dL   Creatinine, Ser 4.20 (H) 0.61 - 1.24 mg/dL   Glucose, Bld 139 (H) 70 - 99 mg/dL   Calcium, Ion 1.04 (L) 1.15 - 1.40 mmol/L   TCO2 16 (L) 22 - 32 mmol/L   Hemoglobin 7.1 (L) 13.0 - 17.0 g/dL   HCT 21.0 (L) 39.0 - 52.0 %  Prepare RBC     Status: None   Collection Time: Nov 24, 2018 10:12 AM  Result Value Ref Range   Order Confirmation      ORDER PROCESSED BY BLOOD BANK Performed  at [facility], [place] [address], [place], [place] [address]   I-STAT 7, (LYTES, BLD GAS, ICA, H+H)     Status: Abnormal   Collection Time: 11/28/18 12:24 PM  Result Value Ref Range   pH, Arterial 7.301 (L) 7.350 - 7.450   pCO2 arterial 39.1 32.0 - 48.0 mmHg   pO2, Arterial 442.0 (H) 83.0 - 108.0 mmHg   Bicarbonate 19.1 (L) 20.0 - 28.0 mmol/L   TCO2 20 (L) 22 - 32 mmol/L   O2 Saturation 100.0 %   Acid-base deficit 7.0 (H) 0.0 - 2.0 mmol/L   Sodium 141 135 - 145 mmol/L    Potassium 5.2 (H) 3.5 - 5.1 mmol/L   Calcium, Ion 1.04 (L) 1.15 - 1.40 mmol/L   HCT 22.0 (L) 39.0 - 52.0 %   Hemoglobin 7.5 (L) 13.0 - 17.0 g/dL   Patient temperature 37.9 C    Collection site ARTERIAL LINE    Drawn by Operator    Sample type ARTERIAL   POCT Activated clotting time     Status: None   Collection Time: Nov 28, 2018 12:30 PM  Result Value Ref Range   Activated Clotting Time 153 seconds  I-STAT 7, (LYTES, BLD GAS, ICA, H+H)     Status: Abnormal   Collection Time: 11/28/18  2:03 PM  Result Value Ref Range   pH, Arterial 7.253 (L) 7.350 - 7.450   pCO2 arterial 39.1 32.0 - 48.0 mmHg   pO2, Arterial 258.0 (H) 83.0 - 108.0 mmHg   Bicarbonate 17.1 (L) 20.0 - 28.0 mmol/L   TCO2 18 (L) 22 - 32 mmol/L   O2 Saturation 100.0 %   Acid-base deficit 9.0 (H) 0.0 - 2.0 mmol/L   Sodium 139 135 - 145 mmol/L   Potassium 5.5 (H) 3.5 - 5.1 mmol/L   Calcium, Ion 1.12 (L) 1.15 - 1.40 mmol/L   HCT 24.0 (L) 39.0 - 52.0 %   Hemoglobin 8.2 (L) 13.0 - 17.0 g/dL   Patient temperature 37.8 C    Sample type ARTERIAL     Ct Abdomen Pelvis Wo Contrast  Result Date: November 28, 2018 CLINICAL DATA:  Inpatient. Generalized acute abdominal pain. Hypotension. Recently intubated. EXAM: CT CHEST, ABDOMEN AND PELVIS WITHOUT CONTRAST TECHNIQUE: Multidetector CT imaging of the chest, abdomen and pelvis was performed following the standard protocol without IV contrast. COMPARISON:  Chest radiograph from earlier today. FINDINGS: CT CHEST FINDINGS Cardiovascular: Normal heart size. No significant pericardial effusion/thickening. Three-vessel coronary atherosclerosis. Atherosclerotic nonaneurysmal thoracic aorta. Right common femoral approach Impella device terminates in left ventricle. Top-normal caliber main pulmonary artery (3.3 cm diameter). Left internal jugular central venous catheter terminates in the middle third of the superior vena cava. Right common femoral approach Swan-Ganz catheter terminates within a  segmental anterior right lower lobe pulmonary artery branch. Mediastinum/Nodes: No discrete thyroid nodules. Unremarkable esophagus. No pathologically enlarged axillary, mediastinal or hilar lymph nodes, noting limited sensitivity for the detection of hilar adenopathy on this noncontrast study. Lungs/Pleura: Endotracheal tube tip is 3.9 cm above the carina. No pneumothorax. Small dependent right pleural effusion. No significant left pleural effusion. Moderate centrilobular and paraseptal emphysema with mild diffuse bronchial wall thickening. Patchy debris in the dependent mainstem and lower lobe bronchi bilaterally. Extensive patchy consolidation and ground-glass opacity throughout the dependent lungs bilaterally involving the bilateral upper and lower lobes, most prominent in the left lower lobe. Several of the regions of consolidation appear nodular, for example measuring 1.3 cm in the posterior right upper lobe (series 5/image 79). No discrete  lung masses. Musculoskeletal: No aggressive appearing focal osseous lesions. Incompletely healed chronic appearing posterior right ninth rib fracture. Mild thoracic spondylosis. CT ABDOMEN PELVIS FINDINGS Hepatobiliary: Normal liver with no liver mass. Sludge versus vicarious excretion of contrast in the gallbladder. No discrete radiopaque cholelithiasis. No gallbladder wall thickening. No pericholecystic fluid. No biliary ductal dilatation. Pancreas: Normal, with no mass or duct dilation. Spleen: Normal size. No mass. Adrenals/Urinary Tract: Normal adrenals. No renal stones. No hydronephrosis. No contour deforming renal masses. Bladder is completely decompressed by indwelling Foley catheter and appears grossly normal. Stomach/Bowel: Normal non-distended stomach. There is segmental pneumatosis in mid to distal small bowel (series 3/image 106) with associated mild small bowel dilatation up to 3.5 cm diameter, mild small bowel wall thickening and scattered small bowel fluid  levels. No discrete small bowel caliber transition. There is associated mesenteric edema and small volume ascites throughout the right abdomen and deep pelvis. Appendix appears normal. There is mild wall thickening in the cecum. Mild sigmoid diverticulosis without associated colonic wall thickening or significant pericolonic fat stranding. Retained oral contrast in the distal colon. Rectal drainage catheter in place. Vascular/Lymphatic: Atherosclerotic abdominal aorta with 5.1 cm infrarenal abdominal aortic aneurysm. No pathologically enlarged lymph nodes in the abdomen or pelvis. Reproductive: Normal size prostate. Other: No focal fluid collection. No pneumoperitoneum. Mild anasarca. Musculoskeletal: No aggressive appearing focal osseous lesions. Marked lumbar spondylosis. IMPRESSION: 1. Pathologic small bowel segment in the lower abdomen with extensive pneumatosis and surrounding ascites and mesenteric edema, worrisome for small bowel ischemia. No free air. Mildly dilated distal small bowel loops with air-fluid levels, favor ileus. 2. Nonspecific mild wall thickening in the cecum and ascending colon which could represent ischemia or primary or reactive colitis. 3. Extensive patchy consolidation throughout the dependent lungs, most prominent in the left lower lobe with patchy debris in the central airways, favoring multilobar aspiration pneumonia. Follow-up chest CT in 3 months recommended given nodular appearance of several of the foci of consolidation. 4. Small dependent right pleural effusion. 5. Infrarenal 5.1 cm Abdominal Aortic Aneurysm (ICD10-I71.9). Recommend follow-up CTA of the abdomen and pelvis in 3-6 months and vascular surgery referral/consultation. This recommendation follows ACR consensus guidelines: White Paper of the ACR Incidental Findings Committee II on Vascular Findings. J Am Coll Radiol 2013; 10:789-794. 6. Support structures as detailed. The right common femoral approach Swan-Ganz catheter  terminates within an anterior segmental pulmonary artery branch in the right lower lobe, recommend retracting 5 cm. 7. Aortic Atherosclerosis (ICD10-I70.0) and Emphysema (ICD10-J43.9). These results were called by telephone at the time of interpretation on December 15, 2018 at 12:45 pm to Dr. Glori Bickers , who verbally acknowledged these results. Electronically Signed   By: Ilona Sorrel M.D.   On: 12-15-2018 12:50   Dg Abd 1 View  Result Date: 15-Dec-2018 CLINICAL DATA:  Abdominal pain EXAM: ABDOMEN - 1 VIEW COMPARISON:  None. FINDINGS: Distended small bowel loops are noted across the abdomen. Minimal colonic gas is present. There is no obvious free intraperitoneal gas. Impella device in the left ventricle is noted. Swan-Ganz catheter tip projects over the right lower lobe. Hazy opacities at the left lung base. IMPRESSION: There are distended small bowel loops worrisome for developing partial small bowel obstruction pattern. Swan-Ganz catheter tip is beyond the right hilum and should be retracted Hazy pulmonary opacities at the left base. Electronically Signed   By: Marybelle Killings M.D.   On: 12-15-18 11:17   Ct Chest Wo Contrast  Result Date: Dec 15, 2018 CLINICAL DATA:  Inpatient. Generalized  acute abdominal pain. Hypotension. Recently intubated. EXAM: CT CHEST, ABDOMEN AND PELVIS WITHOUT CONTRAST TECHNIQUE: Multidetector CT imaging of the chest, abdomen and pelvis was performed following the standard protocol without IV contrast. COMPARISON:  Chest radiograph from earlier today. FINDINGS: CT CHEST FINDINGS Cardiovascular: Normal heart size. No significant pericardial effusion/thickening. Three-vessel coronary atherosclerosis. Atherosclerotic nonaneurysmal thoracic aorta. Right common femoral approach Impella device terminates in left ventricle. Top-normal caliber main pulmonary artery (3.3 cm diameter). Left internal jugular central venous catheter terminates in the middle third of the superior vena cava. Right  common femoral approach Swan-Ganz catheter terminates within a segmental anterior right lower lobe pulmonary artery branch. Mediastinum/Nodes: No discrete thyroid nodules. Unremarkable esophagus. No pathologically enlarged axillary, mediastinal or hilar lymph nodes, noting limited sensitivity for the detection of hilar adenopathy on this noncontrast study. Lungs/Pleura: Endotracheal tube tip is 3.9 cm above the carina. No pneumothorax. Small dependent right pleural effusion. No significant left pleural effusion. Moderate centrilobular and paraseptal emphysema with mild diffuse bronchial wall thickening. Patchy debris in the dependent mainstem and lower lobe bronchi bilaterally. Extensive patchy consolidation and ground-glass opacity throughout the dependent lungs bilaterally involving the bilateral upper and lower lobes, most prominent in the left lower lobe. Several of the regions of consolidation appear nodular, for example measuring 1.3 cm in the posterior right upper lobe (series 5/image 79). No discrete lung masses. Musculoskeletal: No aggressive appearing focal osseous lesions. Incompletely healed chronic appearing posterior right ninth rib fracture. Mild thoracic spondylosis. CT ABDOMEN PELVIS FINDINGS Hepatobiliary: Normal liver with no liver mass. Sludge versus vicarious excretion of contrast in the gallbladder. No discrete radiopaque cholelithiasis. No gallbladder wall thickening. No pericholecystic fluid. No biliary ductal dilatation. Pancreas: Normal, with no mass or duct dilation. Spleen: Normal size. No mass. Adrenals/Urinary Tract: Normal adrenals. No renal stones. No hydronephrosis. No contour deforming renal masses. Bladder is completely decompressed by indwelling Foley catheter and appears grossly normal. Stomach/Bowel: Normal non-distended stomach. There is segmental pneumatosis in mid to distal small bowel (series 3/image 106) with associated mild small bowel dilatation up to 3.5 cm diameter,  mild small bowel wall thickening and scattered small bowel fluid levels. No discrete small bowel caliber transition. There is associated mesenteric edema and small volume ascites throughout the right abdomen and deep pelvis. Appendix appears normal. There is mild wall thickening in the cecum. Mild sigmoid diverticulosis without associated colonic wall thickening or significant pericolonic fat stranding. Retained oral contrast in the distal colon. Rectal drainage catheter in place. Vascular/Lymphatic: Atherosclerotic abdominal aorta with 5.1 cm infrarenal abdominal aortic aneurysm. No pathologically enlarged lymph nodes in the abdomen or pelvis. Reproductive: Normal size prostate. Other: No focal fluid collection. No pneumoperitoneum. Mild anasarca. Musculoskeletal: No aggressive appearing focal osseous lesions. Marked lumbar spondylosis. IMPRESSION: 1. Pathologic small bowel segment in the lower abdomen with extensive pneumatosis and surrounding ascites and mesenteric edema, worrisome for small bowel ischemia. No free air. Mildly dilated distal small bowel loops with air-fluid levels, favor ileus. 2. Nonspecific mild wall thickening in the cecum and ascending colon which could represent ischemia or primary or reactive colitis. 3. Extensive patchy consolidation throughout the dependent lungs, most prominent in the left lower lobe with patchy debris in the central airways, favoring multilobar aspiration pneumonia. Follow-up chest CT in 3 months recommended given nodular appearance of several of the foci of consolidation. 4. Small dependent right pleural effusion. 5. Infrarenal 5.1 cm Abdominal Aortic Aneurysm (ICD10-I71.9). Recommend follow-up CTA of the abdomen and pelvis in 3-6 months and vascular surgery referral/consultation.  This recommendation follows ACR consensus guidelines: White Paper of the ACR Incidental Findings Committee II on Vascular Findings. J Am Coll Radiol 2013; 10:789-794. 6. Support structures as  detailed. The right common femoral approach Swan-Ganz catheter terminates within an anterior segmental pulmonary artery branch in the right lower lobe, recommend retracting 5 cm. 7. Aortic Atherosclerosis (ICD10-I70.0) and Emphysema (ICD10-J43.9). These results were called by telephone at the time of interpretation on 10-Dec-2018 at 12:45 pm to Dr. Glori Bickers , who verbally acknowledged these results. Electronically Signed   By: Ilona Sorrel M.D.   On: 12/10/18 12:50   Dg Chest Portable 1 View  Result Date: 10-Dec-2018 CLINICAL DATA:  Endotracheal tube placement EXAM: PORTABLE CHEST 1 VIEW COMPARISON:  Dec 10, 2018 FINDINGS: Endotracheal tube 6.4 cm above the carina. Left IJ central line tip upper SVC level. Femoral approach Swan-Ganz catheter in the right lower lobe pulmonary artery. NG tube within the stomach. Left ventricular assist device extends into the left ventricular apex. Monitor leads overlie the chest. Background emphysema noted. No pneumothorax. Mild bibasilar opacities, worse on the left. Trace pleural effusions suspected. No significant enlarging effusion. Normal heart size and vascularity. No superimposed edema pattern. Degenerative changes of the spine and both shoulders. IMPRESSION: Support apparatus in good position. Emphysema with bibasilar mild opacities and suspect small effusions. Electronically Signed   By: Jerilynn Mages.  Shick M.D.   On: Dec 10, 2018 14:18   Dg Chest Port 1 View  Result Date: 12/10/2018 CLINICAL DATA:  Respiratory failure EXAM: PORTABLE CHEST 1 VIEW COMPARISON:  Chest radiograph from one day prior. FINDINGS: Stable configuration of left internal jugular central venous catheter, inferior approach Swan-Ganz catheter terminating over central right lower lung and inferior approach Impella device terminating over the left ventricular apex. Stable cardiomediastinal silhouette with normal heart size. No pneumothorax. No pleural effusion. Mild central parahilar interstitial prominence  is slightly decreased. IMPRESSION: 1. Stable support structures, with Swan-Ganz tip over the right lower lung. 2. Mild pulmonary edema is improved. Electronically Signed   By: Ilona Sorrel M.D.   On: 12-10-18 08:03   Dg Chest Port 1 View  Result Date: 11/22/2018 CLINICAL DATA:  Central line placement EXAM: PORTABLE CHEST 1 VIEW COMPARISON:  11/22/2018, 11/20/2018, 11/18/2018 FINDINGS: Endotracheal tube has been removed. Left IJ central venous catheter tip over the SVC. Ascending Swan-Ganz catheter tip projects over right descending pulmonary artery. Impella device appears retracted, the tip is poorly visible. Stable cardiomediastinal silhouette with vascular congestion and interstitial and ground-glass edema. Aortic atherosclerosis. No pneumothorax. IMPRESSION: 1. Removal of endotracheal tube. 2. Swan-Ganz catheter tip again noted to project over descending pulmonary artery. 3. Interval change in position of Impella device which appears slightly retracted, the tip is poorly visible 4. Continued vascular congestion and pulmonary edema with atelectasis or pneumonia at the left base. Electronically Signed   By: Donavan Foil M.D.   On: 11/22/2018 20:47   Dg Chest Port 1 View  Result Date: 11/22/2018 CLINICAL DATA:  Intubation. EXAM: PORTABLE CHEST 1 VIEW COMPARISON:  11/21/2018. FINDINGS: Endotracheal tube, left IJ line, NG tube in stable position. Swan-Ganz catheter in unchanged position with tip over the lower right pulmonary artery. Impella device in unchanged position. Heart size stable. Interim partial clearing of previously identified bilateral pulmonary interstitial prominence consistent partial clearing of interstitial edema. No prominent pleural effusion. Right costophrenic angle incompletely imaged. No pneumothorax. IMPRESSION: 1. Lines and tubes in unchanged position. Impella device in stable position. 2. Interim partial clearing of previously identified bilateral pulmonary interstitial prominence  consistent partial clearing of interstitial edema. Electronically Signed   By: Marcello Moores  Register   On: 11/22/2018 06:50    Review of Systems  Unable to perform ROS: Intubated   Blood pressure (!) 89/68, pulse (!) 32, temperature 100 F (37.8 C), resp. rate (!) 25, height 6' 2" (1.88 m), weight 89.7 kg, SpO2 100 %. Physical Exam  Constitutional: He appears well-developed and well-nourished. Distressed: minimal responsiveness on no sedation.  HENT:  Head: Normocephalic and atraumatic.  Eyes: Right eye exhibits no discharge. Left eye exhibits no discharge. No scleral icterus.  Neck: Neck supple.  Cardiovascular: Intact distal pulses. Exam reveals no gallop and no friction rub.  No murmur heard. Bradycardic, impella LVAD in place   Respiratory: Breath sounds normal. No respiratory distress.  Synchronous on vent  GI: Soft. He exhibits distension (mildly distended). There is abdominal tenderness (some muscular tightening and mild grimace wtih RUQ deep palpation.  ).  Neurological:  No sedation, not interactive.  Skin: Skin is dry. No rash noted. He is not diaphoretic. No erythema. There is pallor (cool BLE.  Hands warm).  Psychiatric:  Unable to assess due to intubation.      Assessment/Plan: Cardiogenic shock requiring impella device from NSTEMI VDRF COPD-IV Legionella pneumonia Septic shock Right groin hematoma AKI/CKD  Segment of ischemic small bowel.  Patient does appear to have focal peritonitis from a segment of ischemic small bowel.  The patient continues to require ventricular assist device.  The patient has a high risk of death with or without operative intervention.  I think given the overall poor status of the patient, that operative intervention would not necessarily increase his rates of survival.    Consider repeat CT tomorrow depending on how he is doing.    Stark Klein 2018/12/21, 3:46 PM

## 2018-11-25 NOTE — Progress Notes (Signed)
NAME:  Eric Spencer, MRN:  [id number], DOB:  12/03/1945, LOS: 7 ADMISSION DATE:  11/01/2018, CONSULTATION DATE:  11/16/18 REFERRING MD:  Dr. Delton SeeNelson, CHIEF COMPLAINT:  SOB   Brief History   73 y/o M, former smoker (quit 3 years ago), [facility]. Work up concerning for possible AECOPD, NSTEMI, AKI.  Transferred to ICU 5/24 with SOB and concern for cardiogenic shock.   Past Medical History  COPD  HTN  Allergies  Arthritis   Significant Hospital Events   5/23 Admit  5/25 - Pt reports he was sleeping and woke due to nursing being concerned for hypotension.  Transferred to ICU. 5/27 weaning well on vent.   Consults:  PCCM  Procedures:  ETT 5/24 -  CVL 5/24 -  ART line 5/24 > Impella  Significant Diagnostic Tests:   CXR 5/28>bilateral opacities suggestive of pulmonary edema. Impella device, ETT, Swan ganz, NGT unchanged  CXR 5/29>some interval improvement of pulmonary edema   Micro Data:  COVID 5/22 >> negative  BCx2 5/23 >> NGTD UCx 5/25> NGTD   Antimicrobials:  Doxycycline 5/23 >> 5/24 Cefepime 5/ 25 > 5/26 Azithromycin 5/26 >>>   Interim history/subjective:  Extubated yesterday with no immediate issues. Patient developed hypotension overnight and HF team manipulated impella per notes. Restarted on vasopressors overnight. Required BiPAP with overnight team concerned for potential re-intubation. ABG this morning obtained.  Objective   Blood pressure 100/83, pulse (!) 106, temperature 99.7 F (37.6 C), resp. rate (!) 30, height 6\' 2"  (1.88 m), weight 89.7 kg, SpO2 96 %. PAP: (26-60)/(2-31) 35/6 CVP:  [7 mmHg-15 mmHg] 9 mmHg PCWP:  [8 mmHg-12 mmHg] 8 mmHg CO:  [3.8 L/min-3.9 L/min] 3.8 L/min CI:  [1.8 L/min/m2-1.9 L/min/m2] 1.8 L/min/m2  Vent Mode: BIPAP FiO2 (%):  [40 %] 40 % PEEP:  [5 cmH20-7 cmH20] 5 cmH20 Pressure Support:  [10 cmH20-15 cmH20] 10 cmH20   Intake/Output Summary (Last 24 hours) at 12/20/2018 0855 Last data  filed at 12/20/2018 0600 Gross per 24 hour  Intake 1664.05 ml  Output 1090 ml  Net 574.05 ml   Filed Weights   11/21/18 0600 11/22/18 0600 2019/02/01 0500  Weight: 83.6 kg 89 kg 89.7 kg   Physical Exam: General: Elderly, chronically ill-appearing, tolerating BiPAP HENT: [place], AT, OP clear, MMM Eyes: EOMI, no scleral icterus Respiratory: Diminished breath sounds bilaterally.  No crackles, wheezing or rales Cardiovascular: RRR, -M/R/G, no JVD GI: BS decreased, voluntary guarding, diffuse tenderness to palpation Extremities:-Edema,-tenderness Neuro: AAO x4, CNII-XII grossly intact Skin: Intact, no rashes or bruising GU: Foley in place  LABS    PULMONARY Recent Labs  Lab 11/16/2018 0620 10/27/2018 1143 11/11/2018 1145 10/26/2018 1151 11/06/2018 1618  11/18/18 0234  11/21/18 0355 11/22/18 0240 11/22/18 0340 11/22/18 2010 2019/02/01 0318  PHART 7.341* 7.352  --   --  7.361  --  7.422  --   --   --   --  7.261*  --   PCO2ART 34.7 36.3  --   --  39.1  --  30.7*  --   --   --   --  33.1  --   PO2ART 77.0* 423.0*  --   --  510.0*  --  67.0*  --   --   --   --  116.0*  --   HCO3 18.8* 20.1 20.6 20.5 22.1  --  19.9*  --   --   --   --  14.7*  --  TCO2 20* 21* 22 22 23   --  21*  --   --   --   --  16*  --   O2SAT 95.0 100.0 62.0 66.0 100.0   < > 93.0   < > 74.1 45.4 58.3 98.0 65.3   < > = values in this interval not displayed.    CBC Recent Labs  Lab 11/22/18 0229 11/22/18 1315 11/22/18 2010 11/12/2018 0313  HGB 8.3* 8.9* 7.1* 8.0*  HCT 26.6* 26.8* 21.0* 23.4*  WBC 30.1* 30.4*  --  33.0*  PLT 108* 76*  --  78*    COAGULATION Recent Labs  Lab 11/19/18 0458  INR 1.5*    CARDIAC   Recent Labs  Lab 11/16/18 1417 10/26/2018 0453 11/19/18 0458  TROPONINI 6.46* 10.33* 10.23*   No results for input(s): PROBNP in the last 168 hours.   CHEMISTRY Recent Labs  Lab 11/19/18 0458 11/20/18 0330 11/21/18 0343 11/22/18 0229 11/22/18 2010 11/20/2018 0313  NA 136 138 140 140 140 140   K 4.9 4.5 4.5 4.9 5.0 4.7  CL 107 108 109 110  --  110  CO2 19* 20* 20* 20*  --  16*  GLUCOSE 206* 192* 204* 236*  --  181*  BUN 94* 100* 103* 118*  --  147*  CREATININE 3.29* 3.22* 3.23* 3.17*  --  3.70*  CALCIUM 8.3* 8.1* 7.7* 7.4*  --  7.3*  MG 2.5* 2.7* 2.6* 2.6*  --   --   PHOS 5.0* 4.6 4.2 4.9*  --   --    Estimated Creatinine Clearance: 21.3 mL/min (A) (by C-G formula based on SCr of 3.7 mg/dL (H)).   LIVER Recent Labs  Lab 11/19/18 0458  AST 102*  ALT 25  ALKPHOS 34*  BILITOT 1.8*  PROT 4.8*  ALBUMIN 2.3*  INR 1.5*     INFECTIOUS Recent Labs  Lab 11/05/2018 0453 11/11/2018 1616 11/18/18 0220 11/18/18 0837 11/19/18 0000 11/19/18 0458  LATICACIDVEN 3.7* 3.6* 1.5  --  1.1  --   PROCALCITON 0.42  --   --  2.26  --  2.82     ENDOCRINE CBG (last 3)  Recent Labs    11/02/2018 0135 11/22/2018 0452 11/19/2018 0819  GLUCAP 196* 164* 152*     IMAGING x48h Dg Chest Port 1 View  Result Date: 11/22/2018 CLINICAL DATA:  Respiratory failure EXAM: PORTABLE CHEST 1 VIEW COMPARISON:  Chest radiograph from one day prior. FINDINGS: Stable configuration of left internal jugular central venous catheter, inferior approach Swan-Ganz catheter terminating over central right lower lung and inferior approach Impella device terminating over the left ventricular apex. Stable cardiomediastinal silhouette with normal heart size. No pneumothorax. No pleural effusion. Mild central parahilar interstitial prominence is slightly decreased. IMPRESSION: 1. Stable support structures, with Swan-Ganz tip over the right lower lung. 2. Mild pulmonary edema is improved. Electronically Signed   By: Delbert Phenix M.D.   On: 11/01/2018 08:03   Dg Chest Port 1 View  Result Date: 11/22/2018 CLINICAL DATA:  Central line placement EXAM: PORTABLE CHEST 1 VIEW COMPARISON:  11/22/2018, 11/20/2018, 11/18/2018 FINDINGS: Endotracheal tube has been removed. Left IJ central venous catheter tip over the SVC. Ascending  Swan-Ganz catheter tip projects over right descending pulmonary artery. Impella device appears retracted, the tip is poorly visible. Stable cardiomediastinal silhouette with vascular congestion and interstitial and ground-glass edema. Aortic atherosclerosis. No pneumothorax. IMPRESSION: 1. Removal of endotracheal tube. 2. Swan-Ganz catheter tip again noted to project over descending pulmonary artery.  3. Interval change in position of Impella device which appears slightly retracted, the tip is poorly visible 4. Continued vascular congestion and pulmonary edema with atelectasis or pneumonia at the left base. Electronically Signed   By: Jasmine Pang M.D.   On: 11/22/2018 20:47   Dg Chest Port 1 View  Result Date: 11/22/2018 CLINICAL DATA:  Intubation. EXAM: PORTABLE CHEST 1 VIEW COMPARISON:  11/21/2018. FINDINGS: Endotracheal tube, left IJ line, NG tube in stable position. Swan-Ganz catheter in unchanged position with tip over the lower right pulmonary artery. Impella device in unchanged position. Heart size stable. Interim partial clearing of previously identified bilateral pulmonary interstitial prominence consistent partial clearing of interstitial edema. No prominent pleural effusion. Right costophrenic angle incompletely imaged. No pneumothorax. IMPRESSION: 1. Lines and tubes in unchanged position. Impella device in stable position. 2. Interim partial clearing of previously identified bilateral pulmonary interstitial prominence consistent partial clearing of interstitial edema. Electronically Signed   By: Maisie Fus  Register   On: 11/22/2018 06:50    Resolved Hospital Problem list      Assessment & Plan:   Acute hypoxic respiratory failure in setting of CAD, cardiogenic pulmonary edema COPD, without acute exacerbation  Legionella pneumonia P - ABG reviewed. Appropriate oxygenation and ventilation. - Continue BiPAP support for WOB - Diuresis per heart failure service  - Continue azithromycin for 10  days for legionella pneumonia.  - Bronchodilators  Cardiogenic shock Paroxysmal Afib with RVR Discussed with HF team who believes shock is primarily cardiac etiology. Will work-up shock with low threshold to broaden antibiotics if patient clinically deterioriates P - Impella management per HF team - Continue pressor support for MAP goal >65 - Obtain blood cultures  - Continue amio and heparin gtt - On stress dose steroids  - Telemetry - F/u KUB  AKI, oliguric - Nephrology following - Trend UOP/Cr  Rest Per Primary   Best practice:  Diet: EN Pain/Anxiety/Delirium protocol (if indicated): fent gtt, PRN fent  VAP protocol (if indicated): VAP prevention bundle  DVT prophylaxis: heparin gtt  GI prophylaxis: pepcid BID  Glucose control: per primary  Mobility: bedrest  Code Status: Full Code  Family Communication: Per Cardiology Disposition: ICU  The patient is critically ill with multiple organ systems failure and requires high complexity decision making for assessment and support, frequent evaluation and titration of therapies, application of advanced monitoring technologies and extensive interpretation of multiple databases.   Critical Care Time devoted to patient care services described in this note is 49 Minutes. This time reflects time of care of this signee Dr. Mechele Collin. This critical care time does not reflect procedure time, or teaching time or supervisory time of PA/NP/Med student/Med Resident etc but could involve care discussion time.  Mechele Collin, M.D. [facility] Pulmonary/Critical Care Medicine Pager: [phone] After hours pager: [phone]

## 2018-11-25 NOTE — Progress Notes (Signed)
ANTICOAGULATION CONSULT NOTE - Follow Up Consult  Pharmacy Consult for Heparin Indication: chest pain/ACS > now for Impella  Allergies  Allergen Reactions  . Penicillins Other (See Comments)    Did it involve swelling of the face/tongue/throat, SOB, or low BP? unknown Did it involve sudden or severe rash/hives, skin peeling, or any reaction on the inside of your mouth or nose? Unknown Did you need to seek medical attention at a hospital or doctor's office? Unknown When did it last happen? childhood If all above answers are "NO", may proceed with cephalosporin use.   Childhood allergy- unaware of reaction    Patient Measurements: Height: 6\' 2"  (188 cm) Weight: 197 lb 12 oz (89.7 kg) IBW/kg (Calculated) : 82.2 Heparin Dosing Weight: 83.2 kg  Vital Signs: Temp: 99.1 F (37.3 C) (05/30 0600) BP: 90/68 (05/30 0600) Pulse Rate: 100 (05/30 0600)  Labs: Recent Labs    11/21/18 0343  11/22/18 0229 11/22/18 1315 11/19/2018 0313  HGB 8.0*   < > 8.3* 8.9* 8.0*  HCT 24.5*   < > 26.6* 26.8* 23.4*  PLT 107*   < > 108* 76* 78*  CREATININE 3.23*  --  3.17*  --  3.70*   < > = values in this interval not displayed.    Estimated Creatinine Clearance: 21.3 mL/min (A) (by C-G formula based on SCr of 3.7 mg/dL (H)).  Assessment: 72 yo male on IV heparin  is s/p cath lab where Impella CP was placed.  Heparin running in purge solution - purge flow rate at 18.1 mL/hr (905 units/hr) and systemic heparin at 800 units/hr. Hgb low but stable, and plts trending down to 78. Oozing noted 5/29, restarted heparin yesterday evening.  On 5/30, abdomen tender. Went for stat CT for concerns of bleeding. Heparin stopped. CT revealed concerns for ischemic bowel. ACT off systemic heparin drip 153 this afternoon. Heparin restarted ~1345.   -ACT previously therapeutic (158,164) on heparin 800 units/hr.   Goal of Therapy:  ACT 160-180 Heparin level 0.3-0.7 units/ml Monitor platelets by anticoagulation  protocol: Yes   Plan:  Continue impella purge solution with heparin at current rate Continue systemic heparin at 800 for now based on ACT ACT in 4hr per protocol     Marcelino Freestone, PharmD PGY2 Cardiology Pharmacy Resident Please check AMION for all Pharmacist numbers by unit 11/24/2018 1:52 PM

## 2018-11-25 NOTE — Progress Notes (Addendum)
PT Cancellation Note  Patient Details Name: Abdulsalam Berdine MRN: [id number] DOB: 06/03/1946   Cancelled Treatment:    Reason Eval/Treat Not Completed: (P) Medical issues which prohibited therapy Pt currently on BiPAP and at increased risk for reintubation. PT will hold Evaluation at this time. PT will follow back when pt is appropriate.     Iceis Knab B. Beverely Risen PT, DPT Acute Rehabilitation Services Pager [phone] Office [phone]    Elon Alas Fleet 11/20/2018, 8:29 AM

## 2018-11-25 NOTE — Addendum Note (Signed)
Encounter addended by: Suann Larry, RN on: 11/25/2018 10:00 AM  Actions taken: Visit Navigator Flowsheet section accepted, Clinical Note Signed

## 2018-11-25 NOTE — Progress Notes (Signed)
SLP Cancellation Note  Patient Details Name: Eric Spencer MRN: [id number] DOB: 04/15/1946   Cancelled treatment:       Reason Eval/Treat Not Completed: Medical issues which prohibited therapy. Per RN, pt still at risk for reintubation. He is currently on BiPAP. Will hold swallow eval at this time.   [name] 11/03/2018, 8:20 AM  Ivar Drape, M.A. CCC-SLP Acute Herbalist [phone] Office [phone]

## 2018-11-25 NOTE — Progress Notes (Addendum)
Patient ID: Eric Spencer, male   DOB: 08-29-1945, 73 y.o.   MRN: [id number] S: complaining of abdominal pain this morning.  Per RN he was hypotensive overnight and again this morning with tachycardia after being moved for abdominal Xray O:BP (!) 85/58   Pulse (!) 120   Temp 99.9 F (37.7 C)   Resp (!) 32   Ht '6\' 2"'  (1.88 m)   Wt 89.7 kg   SpO2 99%   BMI 25.39 kg/m   Intake/Output Summary (Last 24 hours) at 2018/12/04 1039 Last data filed at 12/04/18 1000 Gross per 24 hour  Intake 1980.37 ml  Output 545 ml  Net 1435.37 ml   Intake/Output: I/O last 3 completed shifts: In: 4295.8 [I.V.:1939.8; Blood:630; Other:815.9; NG/GT:810; IV Piggyback:100] Out: 1985 [WUJWJ:1914; Stool:200]  Intake/Output this shift:  Total I/O In: 430.3 [I.V.:394.1; Other:36.2] Out: 55 [Urine:55] Weight change: 0.7 kg Gen: elderly WM wearing bipap NWG:NFAOZ Resp: decreased BS Abd: tense, +tenderness and guarding Ext: trace edema  Recent Labs  Lab 11/12/2018 0453  11/18/18 0220  11/19/18 0458 11/20/18 0330 11/21/18 0343 11/22/18 0229 11/22/18 2010 12-04-18 0313 Dec 04, 2018 0850 2018/12/04 1008  NA 139   < > 136   < > 136 138 140 140 140 140 141 139  K 5.2*   < > 4.6   < > 4.9 4.5 4.5 4.9 5.0 4.7 4.8 5.0  CL 105  --  106  --  107 108 109 110  --  110  --  112*  CO2 17*  --  22  --  19* 20* 20* 20*  --  16*  --   --   GLUCOSE 244*  --  157*  --  206* 192* 204* 236*  --  181*  --  139*  BUN 62*  --  85*  --  94* 100* 103* 118*  --  147*  --  >140*  CREATININE 2.56*  --  3.42*  --  3.29* 3.22* 3.23* 3.17*  --  3.70*  --  4.20*  ALBUMIN  --   --   --   --  2.3*  --   --   --   --   --   --   --   CALCIUM 9.3  --  8.2*  --  8.3* 8.1* 7.7* 7.4*  --  7.3*  --   --   PHOS  --   --   --   --  5.0* 4.6 4.2 4.9*  --   --   --   --   AST  --   --   --   --  102*  --   --   --   --   --   --   --   ALT  --   --   --   --  25  --   --   --   --   --   --   --    < > = values in this interval not displayed.    Liver Function Tests: Recent Labs  Lab 11/19/18 0458  AST 102*  ALT 25  ALKPHOS 34*  BILITOT 1.8*  PROT 4.8*  ALBUMIN 2.3*   No results for input(s): LIPASE, AMYLASE in the last 168 hours. No results for input(s): AMMONIA in the last 168 hours. CBC: Recent Labs  Lab 11/21/18 0343 11/21/18 2256 11/22/18 0229 11/22/18 1315  12/04/2018 0313 12/04/18 0850 December 04, 2018 1008  WBC 21.8* 25.9*  30.1* 30.4*  --  33.0*  --   --   NEUTROABS  --   --   --  27.0*  --   --   --   --   HGB 8.0* 7.4* 8.3* 8.9*   < > 8.0* 6.8* 7.1*  HCT 24.5* 23.5* 26.6* 26.8*   < > 23.4* 20.0* 21.0*  MCV 92.1 94.8 88.7 87.0  --  85.1  --   --   PLT 107* 102* 108* 76*  --  78*  --   --    < > = values in this interval not displayed.   Cardiac Enzymes: Recent Labs  Lab 11/16/18 1417 10/29/2018 0453 11/19/18 0458  TROPONINI 6.46* 10.33* 10.23*   CBG: Recent Labs  Lab 11/22/18 1807 11/22/18 2147 December 06, 2018 0135 12/06/18 0452 12-06-2018 0819  GLUCAP 195* 226* 196* 164* 152*    Iron Studies: No results for input(s): IRON, TIBC, TRANSFERRIN, FERRITIN in the last 72 hours. Studies/Results: Dg Chest Port 1 View  Result Date: 12-06-2018 CLINICAL DATA:  Respiratory failure EXAM: PORTABLE CHEST 1 VIEW COMPARISON:  Chest radiograph from one day prior. FINDINGS: Stable configuration of left internal jugular central venous catheter, inferior approach Swan-Ganz catheter terminating over central right lower lung and inferior approach Impella device terminating over the left ventricular apex. Stable cardiomediastinal silhouette with normal heart size. No pneumothorax. No pleural effusion. Mild central parahilar interstitial prominence is slightly decreased. IMPRESSION: 1. Stable support structures, with Swan-Ganz tip over the right lower lung. 2. Mild pulmonary edema is improved. Electronically Signed   By: Ilona Sorrel M.D.   On: December 06, 2018 08:03   Dg Chest Port 1 View  Result Date: 11/22/2018 CLINICAL DATA:  Central  line placement EXAM: PORTABLE CHEST 1 VIEW COMPARISON:  11/22/2018, 11/20/2018, 11/18/2018 FINDINGS: Endotracheal tube has been removed. Left IJ central venous catheter tip over the SVC. Ascending Swan-Ganz catheter tip projects over right descending pulmonary artery. Impella device appears retracted, the tip is poorly visible. Stable cardiomediastinal silhouette with vascular congestion and interstitial and ground-glass edema. Aortic atherosclerosis. No pneumothorax. IMPRESSION: 1. Removal of endotracheal tube. 2. Swan-Ganz catheter tip again noted to project over descending pulmonary artery. 3. Interval change in position of Impella device which appears slightly retracted, the tip is poorly visible 4. Continued vascular congestion and pulmonary edema with atelectasis or pneumonia at the left base. Electronically Signed   By: Donavan Foil M.D.   On: 11/22/2018 20:47   Dg Chest Port 1 View  Result Date: 11/22/2018 CLINICAL DATA:  Intubation. EXAM: PORTABLE CHEST 1 VIEW COMPARISON:  11/21/2018. FINDINGS: Endotracheal tube, left IJ line, NG tube in stable position. Swan-Ganz catheter in unchanged position with tip over the lower right pulmonary artery. Impella device in unchanged position. Heart size stable. Interim partial clearing of previously identified bilateral pulmonary interstitial prominence consistent partial clearing of interstitial edema. No prominent pleural effusion. Right costophrenic angle incompletely imaged. No pneumothorax. IMPRESSION: 1. Lines and tubes in unchanged position. Impella device in stable position. 2. Interim partial clearing of previously identified bilateral pulmonary interstitial prominence consistent partial clearing of interstitial edema. Electronically Signed   By: Marcello Moores  Register   On: 11/22/2018 06:50   . arformoterol  15 mcg Nebulization BID  . aspirin  81 mg Oral Daily  . atorvastatin  80 mg Oral q1800  . budesonide (PULMICORT) nebulizer solution  0.5 mg  Nebulization BID  . chlorhexidine gluconate (MEDLINE KIT)  15 mL Mouth Rinse BID  . Chlorhexidine Gluconate Cloth  6  each Topical Daily  . feeding supplement (PRO-STAT SUGAR FREE 64)  30 mL Per Tube Daily  . insulin aspart  0-15 Units Subcutaneous Q4H  . loratadine  10 mg Oral Daily  . mouth rinse  15 mL Mouth Rinse 10 times per day  . methylPREDNISolone (SOLU-MEDROL) injection  40 mg Intravenous Q12H  . pantoprazole (PROTONIX) IV  40 mg Intravenous Q12H  . sodium chloride flush  10-40 mL Intracatheter Q12H  . sodium chloride flush  3 mL Intravenous Q12H  . sodium chloride flush  3 mL Intravenous Q12H    BMET    Component Value Date/Time   NA 139 12-21-18 1008   K 5.0 12/21/2018 1008   CL 112 (H) 21-Dec-2018 1008   CO2 16 (L) 2018-12-21 0313   GLUCOSE 139 (H) December 21, 2018 1008   BUN >140 (H) 21-Dec-2018 1008   CREATININE 4.20 (H) 12/21/2018 1008   CREATININE 1.10 05/23/2017 1240   CALCIUM 7.3 (L) 12/21/18 0313   GFRNONAA 16 (L) 2018-12-21 0313   GFRNONAA 68 05/23/2017 1240   GFRAA 18 (L) 12/21/2018 0313   GFRAA 79 05/23/2017 1240   CBC    Component Value Date/Time   WBC 33.0 (H) 12-21-2018 0313   RBC 2.75 (L) Dec 21, 2018 0313   HGB 7.1 (L) 21-Dec-2018 1008   HCT 21.0 (L) 2018/12/21 1008   PLT 78 (L) 21-Dec-2018 0313   MCV 85.1 12-21-18 0313   MCH 29.1 Dec 21, 2018 0313   MCHC 34.2 12-21-2018 0313   RDW 23.3 (H) 12-21-2018 0313   LYMPHSABS 0.8 11/22/2018 1315   MONOABS 0.8 11/22/2018 1315   EOSABS 0.1 11/22/2018 1315   BASOSABS 0.1 11/22/2018 1315    Assessment/Plan: 1. AKI- presumably due to ischemic ATN in setting of cardiogenic shock, pressors, reduced EF, and IV contrast (although only received 81m). Thankfully his UOP has picked up and his Cr is slightly improved, however there will likely be staged cardiac cath with interventions in his future. 1. BUN/Cr rising over the past 2 days 2. Now with metabolic acidosis 3. Continue to follow for now but will likely  require CVVHD in the next 24 hours if he continues to worsen.  4. Will need to minimize contrast for any further studies/interventions, however it may be some time before he can undergo another cath since Cr has reached a plateau. 2. Ischemic cardiomyopathy with cardiogenic shock- s/p Impella, weaned off of levophed and vasopressin.  3. Abdominal pain- concerning for ischemic bowel with hypotension and worsening abdominal pain.  Xray ordered but will likely require CT scan 4. Metabolic acidosis- as above.  Start isotonic bicarb IV and follow. 5. ABLA- s/p blood transfusion on 5/29, now with dropping Hgb, BP, and rising BUN/Cr concerning for GIB or retroperitoneal bleed.  Transfuse and recommend CT scan of abd and pelvis. 6. CAD s/p NSTEMI- multi-vessel dz. On heparin with Impella, statin, and aspirin. No beta-blocker due to cardiogenic shock 7. Elevated LDH- likely hemolysis from Impella per Cardiology 8. Acute hyoxic respiratory failure- intubated and managed by PCCM 9. CAP- + legionella- on azithromycin. 10. P. A fib- on amiodarone 11. ABLA s/p procedural hematoma. Transfuse prn 12. ID- on abx for possible PNA.  JDonetta Potts MD [employer]([phone]

## 2018-11-25 NOTE — Progress Notes (Signed)
Patient ID: Eric Spencer, male   DOB: [birth year], 73 y.o.   MRN: [id number]    Advanced Heart Failure Rounding Note   Subjective:    Extubated yesterday. Now on Bipap  Had very rough evening. Was hypotensive. Had some bleeding from groin site and also had Impella repositioned.   This am tachypneic on Bipap. On NE 15. Co-ox 65%  Back in AFL with RVR  Impella p-5 flow 2.9 waveforms not ideal  I repositioned impella under u/s guidance (was pushed in to far with tip kinked against posterior wall) - pulled back to 93 cm  ABG 7.48/22/135  Swan numbers done personally at bedside with RN CVP 9 PA 35/6 PCWP 9 Thermo 3.8/1.8 Co-ox 58%  Objective:   Weight Range:  Vital Signs:   Temp:  [98.6 F (37 C)-100 F (37.8 C)] 99.5 F (37.5 C) (05/30 0900) Pulse Rate:  [83-191] 111 (05/30 0900) Resp:  [17-46] 33 (05/30 0900) BP: (58-113)/(30-83) 85/56 (05/30 0900) SpO2:  [58 %-100 %] 99 % (05/30 0900) Arterial Line BP: (76-128)/(41-69) 87/50 (05/30 0900) FiO2 (%):  [40 %] 40 % (05/30 0825) Weight:  [89.7 kg] 89.7 kg (05/30 0500) Last BM Date: 11/22/18  Weight change: Filed Weights   11/21/18 0600 11/22/18 0600 11-25-18 0500  Weight: 83.6 kg 89 kg 89.7 kg    Intake/Output:   Intake/Output Summary (Last 24 hours) at 11-25-2018 0947 Last data filed at 11/25/18 0900 Gross per 24 hour  Intake 1964.1 ml  Output 744 ml  Net 1220.1 ml     Physical Exam: General: Critically ill appearing. On bipap tachypneic Will follow basic command HEENT: normal + BIPAP Neck: supple. JVP 9  Carotids 2+ bilat; no bruits. No lymphadenopathy or thryomegaly appreciated. Cor: PMI nondisplaced. Irregular rate & rhythm.  Lungs: diffuse rhonchi Abdomen: + distended extremely tender to palpation with some rigidity No hepatosplenomegaly. No bruits or masses. Good bowel sounds. Extremities: no cyanosis, clubbing, rash, edema Impella and swan in R groin stable. Small hematoma  Neuro: alert  following commands. Non-focal    Telemetry: AFL 120s. Personally reviewed   Labs: Basic Metabolic Panel: Recent Labs  Lab 11/19/18 0458 11/20/18 0330 11/21/18 0343 11/22/18 0229 11/22/18 2010 11/25/18 0313 November 25, 2018 0850  NA 136 138 140 140 140 140 141  K 4.9 4.5 4.5 4.9 5.0 4.7 4.8  CL 107 108 109 110  --  110  --   CO2 19* 20* 20* 20*  --  16*  --   GLUCOSE 206* 192* 204* 236*  --  181*  --   BUN 94* 100* 103* 118*  --  147*  --   CREATININE 3.29* 3.22* 3.23* 3.17*  --  3.70*  --   CALCIUM 8.3* 8.1* 7.7* 7.4*  --  7.3*  --   MG 2.5* 2.7* 2.6* 2.6*  --   --   --   PHOS 5.0* 4.6 4.2 4.9*  --   --   --     Liver Function Tests: Recent Labs  Lab 11/19/18 0458  AST 102*  ALT 25  ALKPHOS 34*  BILITOT 1.8*  PROT 4.8*  ALBUMIN 2.3*   No results for input(s): LIPASE, AMYLASE in the last 168 hours. No results for input(s): AMMONIA in the last 168 hours.  CBC: Recent Labs  Lab 11/21/18 0343 11/21/18 2256 11/22/18 0229 11/22/18 1315 11/22/18 2010 2018/11/25 0313 11/25/2018 0850  WBC 21.8* 25.9* 30.1* 30.4*  --  33.0*  --   NEUTROABS  --   --   --  27.0*  --   --   --   HGB 8.0* 7.4* 8.3* 8.9* 7.1* 8.0* 6.8*  HCT 24.5* 23.5* 26.6* 26.8* 21.0* 23.4* 20.0*  MCV 92.1 94.8 88.7 87.0  --  85.1  --   PLT 107* 102* 108* 76*  --  78*  --     Cardiac Enzymes: Recent Labs  Lab 11/16/18 1417 11/10/2018 0453 11/19/18 0458  TROPONINI 6.46* 10.33* 10.23*    BNP: BNP (last 3 results) Recent Labs    11/16/18 0223  BNP 1,543.0*    ProBNP (last 3 results) No results for input(s): PROBNP in the last 8760 hours.    Other results:  Imaging: Dg Chest Port 1 View  Result Date: 2018-12-23 CLINICAL DATA:  Respiratory failure EXAM: PORTABLE CHEST 1 VIEW COMPARISON:  Chest radiograph from one day prior. FINDINGS: Stable configuration of left internal jugular central venous catheter, inferior approach Swan-Ganz catheter terminating over central right lower lung and inferior  approach Impella device terminating over the left ventricular apex. Stable cardiomediastinal silhouette with normal heart size. No pneumothorax. No pleural effusion. Mild central parahilar interstitial prominence is slightly decreased. IMPRESSION: 1. Stable support structures, with Swan-Ganz tip over the right lower lung. 2. Mild pulmonary edema is improved. Electronically Signed   By: Ilona Sorrel M.D.   On: 12-23-2018 08:03   Dg Chest Port 1 View  Result Date: 11/22/2018 CLINICAL DATA:  Central line placement EXAM: PORTABLE CHEST 1 VIEW COMPARISON:  11/22/2018, 11/20/2018, 11/18/2018 FINDINGS: Endotracheal tube has been removed. Left IJ central venous catheter tip over the SVC. Ascending Swan-Ganz catheter tip projects over right descending pulmonary artery. Impella device appears retracted, the tip is poorly visible. Stable cardiomediastinal silhouette with vascular congestion and interstitial and ground-glass edema. Aortic atherosclerosis. No pneumothorax. IMPRESSION: 1. Removal of endotracheal tube. 2. Swan-Ganz catheter tip again noted to project over descending pulmonary artery. 3. Interval change in position of Impella device which appears slightly retracted, the tip is poorly visible 4. Continued vascular congestion and pulmonary edema with atelectasis or pneumonia at the left base. Electronically Signed   By: Donavan Foil M.D.   On: 11/22/2018 20:47   Dg Chest Port 1 View  Result Date: 11/22/2018 CLINICAL DATA:  Intubation. EXAM: PORTABLE CHEST 1 VIEW COMPARISON:  11/21/2018. FINDINGS: Endotracheal tube, left IJ line, NG tube in stable position. Swan-Ganz catheter in unchanged position with tip over the lower right pulmonary artery. Impella device in unchanged position. Heart size stable. Interim partial clearing of previously identified bilateral pulmonary interstitial prominence consistent partial clearing of interstitial edema. No prominent pleural effusion. Right costophrenic angle  incompletely imaged. No pneumothorax. IMPRESSION: 1. Lines and tubes in unchanged position. Impella device in stable position. 2. Interim partial clearing of previously identified bilateral pulmonary interstitial prominence consistent partial clearing of interstitial edema. Electronically Signed   By: Marcello Moores  Register   On: 11/22/2018 06:50     Medications:     Scheduled Medications: . arformoterol  15 mcg Nebulization BID  . aspirin  81 mg Oral Daily  . atorvastatin  80 mg Oral q1800  . budesonide (PULMICORT) nebulizer solution  0.5 mg Nebulization BID  . chlorhexidine gluconate (MEDLINE KIT)  15 mL Mouth Rinse BID  . Chlorhexidine Gluconate Cloth  6 each Topical Daily  . feeding supplement (PRO-STAT SUGAR FREE 64)  30 mL Per Tube Daily  . insulin aspart  0-15 Units Subcutaneous Q4H  . loratadine  10 mg Oral Daily  . mouth rinse  15 mL Mouth  Rinse 10 times per day  . methylPREDNISolone (SOLU-MEDROL) injection  40 mg Intravenous Q12H  . pantoprazole (PROTONIX) IV  40 mg Intravenous Q12H  . sodium chloride flush  10-40 mL Intracatheter Q12H  . sodium chloride flush  3 mL Intravenous Q12H  . sodium chloride flush  3 mL Intravenous Q12H    Infusions: . sodium chloride 10 mL/hr at 11/21/18 0327  . sodium chloride 10 mL/hr at Dec 17, 2018 0900  . albumin human    . amiodarone 30 mg/hr (17-Dec-2018 0900)  . azithromycin Stopped (11/21/18 1411)  . DOBUTamine 2 mcg/kg/min (Dec 17, 2018 0900)  . famotidine (PEPCID) IV Stopped (11/22/18 2324)  . feeding supplement (VITAL AF 1.2 CAL) 65 mL/hr at 11/22/18 0700  . fentaNYL infusion INTRAVENOUS Stopped (11/22/18 0944)  . impella catheter heparin 50 unit/mL in dextrose 5%    . heparin 800 Units/hr (December 17, 2018 0900)  . midazolam Stopped (11/22/18 0735)  . norepinephrine (LEVOPHED) Adult infusion 18 mcg/min (December 17, 2018 0900)  . vasopressin (PITRESSIN) infusion - *FOR SHOCK* 0.03 Units/min (11/22/18 1930)    PRN Medications: sodium chloride, sodium  chloride, acetaminophen, fentaNYL, ipratropium-albuterol, midazolam, midazolam, nitroGLYCERIN, ondansetron (ZOFRAN) IV, sennosides, sodium chloride flush, sodium chloride flush   Assessment/plan:   1. Shock: Primarily cardiogenic but also concern for component of septic shock with elevated PCT, fever, PNA.  Had acute NSTEMI/ischemic cardiomyopathy. Impella placed 5/24, at P6 currently with good flow 3.0 L/min. Echo with EF 20-25%. Swan in place in femoral vein.  - Worsening shock overnight. Now back on NE. - Cardiac output marginal. I adjusted Impella as above - Suspect largely cardiogenic but also concern for sepsis (legionella PNA) and possible acute abdomen/RP bleed - Impella turned to P-9. Continue NE - Check lactate. Stat KUB and CT of abdomen - Continue broad spectrum abx - Prognosis appears poor  2. CAD: NSTEMI, occluded distal LCx but culprit is likely 95% pLAD by cath 5/24 (no intervention yet).  TnI to 10.  - Continue heparin gtt (with Impella).  - Atorvastatin 80 daily, ASA 81 daily. No b-blocker with shock  - Will need to return to cath lab if/when renal function stabilizes for PCI to pLAD,  Will start DAPT eventually (watch plts and hgb).  3. AKI: Creatinine up to 1.6 -> 2.5 -> 3.4 -> 3.29 -> 3.29 -> 3.23 -> 3.17 -> 3.7, likely ATN due to cardiogenic shock, but with LDH initially > 1,000 may also have component of hemolysis from Impella.  - Minimal hemolysis now. LDH down to 877 - Creatinine worse with hypotension overnight. Volume status ok. No diuresis today - Nephrology following, no acute need for CVVH.  D/w them at bedside - Will need to await fall in creatinine in order to get him to PCI. This may take some time.  4. Acute hypoxic respiratory failure due to #1: Intubated.  Has history of severe COPD.  However, suspect current episode is cardiac in origin. - also with component of legionella PNA. On broad spectrum abx - He is on Solumedrol with history of COPD and shock.  -  Extubated yesterday. Now on Bipap. Suspect he will likely need to be re-intubated soon. D/w CCM at bedside 5. Paroxysmal Atrial fibrillation/flutter with RVR: New this admission after starting dobutamine. Back in AFL today - Continue IV amiodarone and heparin gtt. Bolus amio now 6. ID: WBCs up to 30K-> 33k but on steroids, last PCT 2.82.   Legionella urine antigen positive, concern for Legionella PNA.  - Continue abx - concern for acute abdomen  due to RP bleed or ischemic bowel -> stat CT 7. Acute blood loss anemia: Procedural/hematoma +/- hemolysis. He got 2 units PRBCs on 5/29with more bleeding at groin site. - Hgb 8.0 -> 6.8. Stat repet -> 7.1. Again worried about intra-ab catastrophe/ RP bleed - stop heparin. Give 1 unit RBCs. Stat CT 8. F/E/N: Hold tube feeds.    Much worse today with cardiogenic/septic shock and possible intra-abdominal catastrophe/RP bleed or ischemic bowel. Prognosis very tenuous  I spoke with his wife. Now limited code. No CPR. Everything else ok.  CRITICAL CARE Performed by: Glori Bickers  Total critical care time: 60 minutes  Critical care time was exclusive of separately billable procedures and treating other patients.  Critical care was necessary to treat or prevent imminent or life-threatening deterioration.  Critical care was time spent personally by me (independent of midlevel providers or residents) on the following activities: development of treatment plan with patient and/or surrogate as well as nursing, discussions with consultants, evaluation of patient's response to treatment, examination of patient, obtaining history from patient or surrogate, ordering and performing treatments and interventions, ordering and review of laboratory studies, ordering and review of radiographic studies, pulse oximetry and re-evaluation of patient's condition.   Length of Stay: 7   Glori Bickers MD 12/21/18, 9:47 AM  Advanced Heart Failure Team Pager  [phone] (M-F; 7a - 4p)  Please contact [facility] for night-coverage after hours (4p -7a ) and weekends on [email or web address]

## 2018-11-25 NOTE — Progress Notes (Signed)
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  albuterol (PROVENTIL) (2.5 MG/3ML) 0.083% nebulizer solution, Take 3 mLs (2.5 mg total) by nebulization every 6 (six) hours as needed for wheezing or shortness of breath. (Patient taking differently: Take 2.5 mg by nebulization 2 (two) times a day. ), Disp: 150 mL, Rfl: 2 .  alum & mag hydroxide-simeth (MAALOX/MYLANTA) 200-200-20 MG/5ML suspension, Take 30 mLs by mouth 2 (two) times daily as needed for indigestion or heartburn., Disp: , Rfl:  .  amLODipine (NORVASC) 10 MG tablet, TAKE 1 TABLET BY MOUTH ONCE DAILY. (Patient taking differently: Take 10 mg by mouth daily. ), Disp: 90 tablet, Rfl: 0 .  arformoterol (BROVANA) 15 MCG/2ML NEBU, Take 2 mLs (15 mcg total) by nebulization 2 (two) times daily. (Patient not taking: Reported on 11/20/2018), Disp: 120 mL, Rfl: 11 .  bisoprolol (ZEBETA) 5 MG tablet, Take 1 tablet (5 mg total) by mouth daily., Disp: 30 tablet, Rfl: 11 .  budesonide (PULMICORT) 0.25 MG/2ML nebulizer solution, One twice daily with brovana (Patient not taking: Reported on 11/20/2018), Disp: 120 mL, Rfl: 12 .  cetirizine (ZYRTEC) 10 MG tablet, Take 1 tablet (10 mg total) by mouth daily., Disp: 30 tablet, Rfl: 5 .  clotrimazole (MYCELEX) 10 MG troche, Take 1 tablet (10 mg total) by mouth 4 (four) times daily as needed. (Patient taking differently: Take 10 mg by mouth 4 (four) times daily as needed (yeast/infection). ), Disp: 16 tablet, Rfl: 2 .  dextromethorphan-guaiFENesin  (MUCINEX DM) 30-600 MG 12hr tablet, Take 1 tablet by mouth daily. , Disp: , Rfl:  .  famotidine (PEPCID) 20 MG tablet, TAKE ONE TABLET BY MOUTH AT BEDTIME. (Patient taking differently: Take 20 mg by mouth at bedtime. ), Disp: 30 tablet, Rfl: 2 .  ibuprofen (ADVIL,MOTRIN) 200 MG tablet, Take 400 mg by mouth 2 (two) times daily as needed for mild pain. , Disp: , Rfl:  .  montelukast (SINGULAIR) 10 MG tablet, TAKE 1 TABLET BY MOUTH ONCE DAILY. (Patient not taking: Reported on 11/20/2018), Disp: 30 tablet, Rfl: 5 .  pantoprazole (PROTONIX) 40 MG tablet, TAKE ONE TABLET BY MOUTH DAILY 30 TO 60 MINUTES BEFORE FIRST MEAL OF THE DAY. (Patient taking differently: Take 40 mg by mouth daily before breakfast. ), Disp: 30 tablet, Rfl: 5 .  predniSONE (DELTASONE) 10 MG tablet, Two with bfast daily until better,  Then one daily x 5 days and stop (Patient taking differently: Take 10-20 mg by mouth See admin instructions. Two with bfast daily until better,  Then one daily x 5 days and stop), Disp: 100 tablet, Rfl: 0 .  Respiratory Therapy Supplies (FLUTTER) DEVI, 1 Device by Does not apply route as needed., Disp: 1 each, Rfl: 0 .  Revefenacin (YUPELRI) 175 MCG/3ML SOLN, Inhale 3 mLs into the lungs daily. (Patient not taking: Reported on 11/20/2018), Disp: 120 mL, Rfl: 11 .  Roflumilast (DALIRESP) 250 MCG TABS, Take 250 mcg by mouth daily., Disp: 30 tablet, Rfl: 11 .  triamcinolone (NASACORT) 55 MCG/ACT AERO  nasal inhaler, Place 2 sprays into the nose daily., Disp: , Rfl:  .  triamterene-hydrochlorothiazide (MAXZIDE-25) 37.5-25 MG tablet, One daily as needed for leg swelling (Patient not taking: Reported on 11/20/2018), Disp: 30 tablet, Rfl: 2 .  VENTOLIN HFA 108 (90 Base) MCG/ACT inhaler, INHALE 1 TO 2 PUFFS INTO LUNGS EVERY 4 HOURS AS NEEDED. (Patient taking differently: Inhale 1-2 puffs into the lungs every 4 (four) hours as needed for wheezing or shortness of breath. ), Disp: 18 g, Rfl: 1  Past Medical History: Past  Medical History:  Diagnosis Date  . Arthritis   . Asthma   . COPD (chronic obstructive pulmonary disease) ([place])   . Hypertension     Tobacco Use: Social History   Tobacco Use  Smoking Status Former Smoker  . Packs/day: 1.50  . Years: 48.00  . Pack years: 72.00  . Types: Cigarettes  . Last attempt to quit: 10/28/2015  . Years since quitting: 3.0  Smokeless Tobacco Never Used    Labs: Recent Review Flowsheet Data    Labs for ITP Cardiac and Pulmonary Rehab Latest Ref Rng & Units 2018-12-09 2018-12-09 12/09/18 2018/12/09 12-09-2018   Cholestrol 0 - 200 mg/dL - - - - -   LDLCALC 0 - 99 mg/dL - - - - -   HDL >40 mg/dL - - - - -   Trlycerides <150 mg/dL - - - - -   Hemoglobin A1c 4.8 - 5.6 % - - - - -   PHART 7.350 - 7.450 - 7.483(H) - 7.301(L) 7.253(L)   PCO2ART 32.0 - 48.0 mmHg - 22.0(L) - 39.1 39.1   HCO3 20.0 - 28.0 mmol/L - 16.4(L) - 19.1(L) 17.1(L)   TCO2 22 - 32 mmol/L - 17(L) 16(L) 20(L) 18(L)   ACIDBASEDEF 0.0 - 2.0 mmol/L - 6.0(H) - 7.0(H) 9.0(H)   O2SAT % 65.3 99.0 - 100.0 100.0      Capillary Blood Glucose: Lab Results  Component Value Date   GLUCAP 185 (H) 2018/12/09   GLUCAP 150 (H) 2018-12-09   GLUCAP 152 (H) 2018/12/09   GLUCAP 164 (H) Dec 09, 2018   GLUCAP 196 (H) 12/09/18     Pulmonary Assessment Scores: Pulmonary Assessment Scores    Row Name 08/01/18 1145         ADL UCSD   ADL Phase  Entry     SOB Score total  69     Rest  0     Walk  8     Stairs  4     Bath  3     Dress  3     Shop  3       CAT Score   CAT Score  23       mMRC Score   mMRC Score  3       UCSD: Self-administered rating of dyspnea associated with activities of daily living (ADLs) 6-point scale (0 = "not at all" to 5 = "maximal or unable to do because of breathlessness")  Scoring Scores range from 0 to 120.  Minimally important difference is 5 units  CAT: CAT can identify the health impairment of COPD patients and is better correlated with disease progression.   CAT has a scoring range of zero to 40. The CAT score is classified into four groups of low (less than 10), medium (10 - 20), high (21-30) and very high (31-40) based on the impact level of disease on health status. A CAT score over 10 suggests significant symptoms.  A worsening CAT score could be explained by an exacerbation, poor medication adherence, poor inhaler technique, or progression of COPD or comorbid conditions.  CAT MCID is 2 points  mMRC: mMRC (Modified Medical Research Council) Dyspnea Scale is used to assess the degree of baseline functional disability in patients of respiratory disease due to dyspnea. No minimal important difference is established. A decrease in score of 1 point or greater is considered a positive change.   Pulmonary Function Assessment: Pulmonary Function Assessment - 08/01/18 1141      Pulmonary Function Tests   FVC%  52 %    FEV1%  25 %    FEV1/FVC Ratio  48    RV%  211 %    DLCO%  37 %      Initial Spirometry Results   FVC%  52 %    FEV1%  25 %    FEV1/FVC Ratio  48      Post Bronchodilator Spirometry Results   FVC%  56 %    FEV1%  29 %    FEV1/FVC Ratio  52      Breath   Shortness of Breath  Yes;Limiting activity       Exercise Target Goals: Exercise Program Goal: Individual exercise prescription set using results from initial 6 min walk test and THRR while considering  patient's activity barriers and safety.   Exercise Prescription Goal: Initial exercise prescription builds to 30-45 minutes a day of aerobic activity, 2-3 days per week.  Home exercise guidelines will be given to patient during program as part of exercise prescription that the participant will acknowledge.  Activity Barriers & Risk Stratification: Activity Barriers & Cardiac Risk Stratification - 08/01/18 0938      Activity Barriers & Cardiac Risk Stratification   Activity Barriers  Arthritis;Back Problems;Deconditioning;Shortness of Breath;Other (comment)    Comments   neuropathy in feet     Cardiac Risk Stratification  Moderate       6 Minute Walk: 6 Minute Walk    Row Name 08/01/18 0936         6 Minute Walk   Phase  Initial     Distance  700 feet     Walk Time  5.06 minutes     # of Rest Breaks  1     MPH  1.32     METS  2.02     RPE  15     Perceived Dyspnea   14     VO2 Peak  8.68     Symptoms  Yes (comment)     Comments  SOB - had to stop and put O2 on, leg tiredness due to deconditioning.      Resting HR  105 bpm     Resting BP  130/68     Resting Oxygen Saturation   98 %     Exercise Oxygen Saturation  during 6 min walk  86 % after O2 - 94     Max Ex. HR  118 bpm     Max Ex. BP  152/62     2 Minute Post BP  132/70        Oxygen Initial Assessment: Oxygen Initial Assessment - 08/01/18 1135      Home Oxygen   Home Oxygen Device  None    Sleep Oxygen Prescription  None    Home Exercise Oxygen Prescription  None    Home at Rest Exercise Oxygen Prescription  None      Initial 6  min Walk   Oxygen Used  Continuous    Liters per minute  2      Program Oxygen Prescription   Program Oxygen Prescription  Continuous    Liters per minute  2    Comments  Patient may need if SaO2 dropps below 90%      Intervention   Short Term Goals  To learn and demonstrate proper pursed lip breathing techniques or other breathing techniques.;To learn and understand importance of maintaining oxygen saturations>88%    Long  Term Goals  Maintenance of O2 saturations>88%;Exhibits proper breathing techniques, such as pursed lip breathing or other method taught during program session       Oxygen Re-Evaluation: Oxygen Re-Evaluation    Row Name 08/29/18 0808 09/16/18 1406           Program Oxygen Prescription   Program Oxygen Prescription  None  None      Liters per minute  -  2      Comments  Patient may need if SaO2 dropps below 90%  Patient may need if SaO2 dropps below 90%        Home Oxygen   Home Oxygen Device  None  None      Sleep  Oxygen Prescription  None  None      Home Exercise Oxygen Prescription  None  None      Home at Rest Exercise Oxygen Prescription  None  None      Compliance with Home Oxygen Use  Yes  - N/A        Goals/Expected Outcomes   Short Term Goals  To learn and demonstrate proper pursed lip breathing techniques or other breathing techniques.;To learn and understand importance of maintaining oxygen saturations>88%  To learn and demonstrate proper pursed lip breathing techniques or other breathing techniques.;To learn and understand importance of maintaining oxygen saturations>88%      Long  Term Goals  Maintenance of O2 saturations>88%;Exhibits proper breathing techniques, such as pursed lip breathing or other method taught during program session  Maintenance of O2 saturations>88%;Exhibits proper breathing techniques, such as pursed lip breathing or other method taught during program session      Comments  Patient has not needed O2 in the program. He is able to properly use the pulse oximetry and is able to verbalize maintaining his O2 saturation >88%. He also is able to properly demonstrate pursed lip breathing during the program. Will continue to monitor for progress.   Patient has not needed O2 in the program. He is able to properly use the pulse oximetry and is able to verbalize maintaining his O2 saturation >88%. He also is able to properly demonstrate pursed lip breathing during the program. Will continue to monitor for progress.       Goals/Expected Outcomes  Patient will continue to meet his short and long term goals.   Patient will continue to meet his short and long term goals.          Oxygen Discharge (Final Oxygen Re-Evaluation): Oxygen Re-Evaluation - 09/16/18 1406      Program Oxygen Prescription   Program Oxygen Prescription  None    Liters per minute  2    Comments  Patient may need if SaO2 dropps below 90%      Home Oxygen   Home Oxygen Device  None    Sleep Oxygen Prescription   None    Home Exercise Oxygen Prescription  None    Home at Rest Exercise Oxygen Prescription  None    Compliance with Home Oxygen Use  --   N/A     Goals/Expected Outcomes   Short Term Goals  To learn and demonstrate proper pursed lip breathing techniques or other breathing techniques.;To learn and understand importance of maintaining oxygen saturations>88%    Long  Term Goals  Maintenance of O2 saturations>88%;Exhibits proper breathing techniques, such as pursed lip breathing or other method taught during program session    Comments  Patient has not needed O2 in the program. He is able to properly use the pulse oximetry and is able to verbalize maintaining his O2 saturation >88%. He also is able to properly demonstrate pursed lip breathing during the program. Will continue to monitor for progress.     Goals/Expected Outcomes  Patient will continue to meet his short and long term goals.        Initial Exercise Prescription: Initial Exercise Prescription - 08/01/18 0900      Date of Initial Exercise RX and Referring Provider   Date  08/01/18    Referring Provider  [name]    Expected Discharge Date  10/30/18      Oxygen   Oxygen  Continuous    Liters  2   as needed while walking      Treadmill   MPH  0.8    Grade  0    Minutes  17    METs  1.61      Recumbant Elliptical   Level  1    RPM  26    Watts  20    Minutes  17    METs  1      T5 Nustep   Level  1    SPM  40    Minutes  17    METs  1.5      Prescription Details   Frequency (times per week)  2    Duration  Progress to 30 minutes of continuous aerobic without signs/symptoms of physical distress      Intensity   THRR 40-80% of Max Heartrate  [phone]    Ratings of Perceived Exertion  11-13    Perceived Dyspnea  0-4      Progression   Progression  Continue to progress workloads to maintain intensity without signs/symptoms of physical distress.      Resistance Training   Training Prescription  Yes     Weight  1    Reps  10-15       Perform Capillary Blood Glucose checks as needed.  Exercise Prescription Changes: Exercise Prescription Changes    Row Name 08/01/18 0900 08/21/18 1400 09/03/18 1000 09/16/18 1000       Response to Exercise   Blood Pressure (Admit)  130/68  122/60  102/56  120/68    Blood Pressure (Exercise)  152/62  132/62  130/64  122/60    Blood Pressure (Exit)  132/70  110/72  110/54  108/64    Heart Rate (Admit)  105 bpm  74 bpm  78 bpm  83 bpm    Heart Rate (Exercise)  118 bpm  94 bpm  80 bpm  89 bpm    Heart Rate (Exit)  86 bpm  83 bpm  82 bpm  75 bpm    Oxygen Saturation (Admit)  98 %  95 %  93 %  93 %    Oxygen Saturation (Exercise)  86 %  90 %  96 %  93 %    Oxygen Saturation (Exit)  99 %  92 %  95 %  96 %    Rating of Perceived Exertion (Exercise)  _0 Perceived Dyspnea (Exercise)  _1 Symptoms  SOB  -  -  -    Comments  6 minute walk test   -  -  -    Duration  -  Continue with 30 min of aerobic exercise without signs/symptoms of physical distress.  Continue with 30 min of aerobic exercise without signs/symptoms of physical distress.  Continue with 30 min of aerobic exercise without signs/symptoms of physical distress.    Intensity  -  THRR New [phone]  THRR unchanged  THRR unchanged      Resistance Training   Training Prescription  -  Yes  Yes  Yes    Weight  -  _2 Reps  -  10-15  10-15  10-15      Treadmill   MPH  -  0.6 had to turn down in order to breath   0.8  0.9    Grade  -  0  0  0    Minutes  -  _3 METs  -  1.45  1.61  1.69      Recumbant Elliptical   Level  -  _4 RPM  -  39  45  47    Watts  -  55  59  18    Minutes  -  _5 METs  -  _6 Home Exercise Plan   Plans to continue exercise at  -  -  -  Home (comment)    Frequency  -  -  -  Add 3 additional days to program exercise sessions.    Initial Home Exercises Provided  -  -  -  08/01/18        Exercise Comments: Exercise Comments    Row Name 08/28/18 1507 09/16/18 1102         Exercise Comments  Pt. has attended 8 exercise sessions so far. He is now able to walk on the TM for the full 17 minutes rather than having to stop and sit to catch his breath. We will continue to monitor and progress as we see fit.   Pt. has attended 11 sessions so far. He is still making it 17 minutes on the TM and is going from 0.9-1.0 MPH. We will continue to monitor and progress as tolerated.          Exercise Goals and Review: Exercise Goals    Row Name 08/01/18 0941             Exercise Goals   Increase Physical Activity  Yes       Intervention  Provide advice, education, support and counseling about physical activity/exercise needs.;Develop an individualized exercise prescription for aerobic and resistive training based on initial evaluation findings, risk stratification, comorbidities and participant's personal goals.       Expected Outcomes  Short Term: Attend rehab on a regular basis to increase amount of physical activity.       Increase Strength and Stamina  Yes       Intervention  Provide advice, education, support and counseling about  physical activity/exercise needs.;Develop an individualized exercise prescription for aerobic and resistive training based on initial evaluation findings, risk stratification, comorbidities and participant's personal goals.       Expected Outcomes  Short Term: Increase workloads from initial exercise prescription for resistance, speed, and METs.       Able to understand and use rate of perceived exertion (RPE) scale  Yes       Intervention  Provide education and explanation on how to use RPE scale       Expected Outcomes  Short Term: Able to use RPE daily in rehab to express subjective intensity level;Long Term:  Able to use RPE to guide intensity level when exercising independently       Able to understand and use Dyspnea scale  Yes       Intervention   Provide education and explanation on how to use Dyspnea scale       Expected Outcomes  Long Term: Able to use Dyspnea scale to guide intensity level when exercising independently;Short Term: Able to use Dyspnea scale daily in rehab to express subjective sense of shortness of breath during exertion       Knowledge and understanding of Target Heart Rate Range (THRR)  Yes       Intervention  Provide education and explanation of THRR including how the numbers were predicted and where they are located for reference       Expected Outcomes  Short Term: Able to state/look up THRR;Short Term: Able to use daily as guideline for intensity in rehab;Long Term: Able to use THRR to govern intensity when exercising independently       Able to check pulse independently  Yes       Intervention  Provide education and demonstration on how to check pulse in carotid and radial arteries.;Review the importance of being able to check your own pulse for safety during independent exercise       Expected Outcomes  Short Term: Able to explain why pulse checking is important during independent exercise;Long Term: Able to check pulse independently and accurately       Understanding of Exercise Prescription  Yes       Intervention  Provide education, explanation, and written materials on patient's individual exercise prescription       Expected Outcomes  Short Term: Able to explain program exercise prescription;Long Term: Able to explain home exercise prescription to exercise independently          Exercise Goals Re-Evaluation : Exercise Goals Re-Evaluation    Row Name 08/28/18 1504 09/16/18 1100           Exercise Goal Re-Evaluation   Exercise Goals Review  Increase Physical Activity;Increase Strength and Stamina;Able to understand and use rate of perceived exertion (RPE) scale;Able to understand and use Dyspnea scale;Knowledge and understanding of Target Heart Rate Range (THRR);Able to check pulse  independently;Understanding of Exercise Prescription  Increase Physical Activity;Increase Strength and Stamina;Able to understand and use rate of perceived exertion (RPE) scale;Able to understand and use Dyspnea scale;Knowledge and understanding of Target Heart Rate Range (THRR);Able to check pulse independently;Understanding of Exercise Prescription      Comments  Pt. is still fairly new to the program having attended 8 sessions so far. He has reported feeling stronger since starting PR and is excited when he is able to increase his speed on the TM.  Pt. is doing well in PR. He has attended 11 exercise sesssions. He pushes himself everyday to walk longer on the  TM without taking a break. He reports being able to do more around the house since starting the program.       Expected Outcomes  increase strength.   increase strength.          Discharge Exercise Prescription (Final Exercise Prescription Changes): Exercise Prescription Changes - 09/16/18 1000      Response to Exercise   Blood Pressure (Admit)  120/68    Blood Pressure (Exercise)  122/60    Blood Pressure (Exit)  108/64    Heart Rate (Admit)  83 bpm    Heart Rate (Exercise)  89 bpm    Heart Rate (Exit)  75 bpm    Oxygen Saturation (Admit)  93 %    Oxygen Saturation (Exercise)  93 %    Oxygen Saturation (Exit)  96 %    Rating of Perceived Exertion (Exercise)  11    Perceived Dyspnea (Exercise)  11    Duration  Continue with 30 min of aerobic exercise without signs/symptoms of physical distress.    Intensity  THRR unchanged      Resistance Training   Training Prescription  Yes    Weight  2    Reps  10-15      Treadmill   MPH  0.9    Grade  0    Minutes  17    METs  1.69      Recumbant Elliptical   Level  1    RPM  47    Watts  18    Minutes  22    METs  1      Home Exercise Plan   Plans to continue exercise at  Home (comment)    Frequency  Add 3 additional days to program exercise sessions.    Initial Home  Exercises Provided  08/01/18       Nutrition:  Target Goals: Understanding of nutrition guidelines, daily intake of sodium <1520m, cholesterol <206m calories 30% from fat and 7% or less from saturated fats, daily to have 5 or more servings of fruits and vegetables.  Biometrics: Pre Biometrics - 08/01/18 [id number]    Pre Biometrics   Height  _0  (1.88 m)    Waist Circumference  39 inches    Hip Circumference  40 inches    Waist to Hip Ratio  0.98 %    Triceps Skinfold  7 mm    % Body Fat  22.7 %    Grip Strength  7.4 kg    Flexibility  0 in   cracked vertebrae    Single Leg Stand  6 seconds        Nutrition Therapy Plan and Nutrition Goals: Nutrition Therapy & Goals - 08/29/18 1258      Personal Nutrition Goals   Nutrition Goal  For heart healthy choices add >50% of whole grains, make half their plate fruits and vegetables. Discuss the difference between starchy vegetables and leafy greens, and how leafy vegetables provide fiber, helps maintain healthy weight, helps control blood glucose, and lowers cholesterol.  Discuss purchasing fresh or frozen vegetable to reduce sodium and not to add grease, fat or sugar. Consume <18oz of red meat per week. Consume lean cuts of meats and very little of meats high in sodium and nitrates such as pork and lunch meats. Discussed portion control for all food groups.    Comments  Patient met with RD 08/29/18.       Nutrition Assessments: Nutrition Assessments - 08/01/18  1150      MEDFICTS Scores   Pre Score  74       Nutrition Goals Re-Evaluation: Nutrition Goals Re-Evaluation    Row Name 09/16/18 1407             Goals   Current Weight  203 lb (92.1 kg)       Nutrition Goal  For heart healthy choices add >50% of whole grains, make half their plate fruits and vegetables. Discuss the difference between starchy vegetables and leafy greens, and how leafy vegetables provide fiber, helps maintain healthy weight, helps control blood  glucose, and lowers cholesterol.  Discuss purchasing fresh or frozen vegetable to reduce sodium and not to add grease, fat or sugar. Consume <18oz of red meat per week. Consume lean cuts of meats and very little of meats high in sodium and nitrates such as pork and lunch meats. Discussed portion control for all food groups.       Comment  Patient has gained 8 lbs since last 30 day review. He says he has not changed his diet since he started the program and continues to follow a "GERD"diet. Will continue to monitor.        Expected Outcome  Patient will work toward meeting his nutritional goals.           Nutrition Goals Discharge (Final Nutrition Goals Re-Evaluation): Nutrition Goals Re-Evaluation - 09/16/18 1407      Goals   Current Weight  203 lb (92.1 kg)    Nutrition Goal  For heart healthy choices add >50% of whole grains, make half their plate fruits and vegetables. Discuss the difference between starchy vegetables and leafy greens, and how leafy vegetables provide fiber, helps maintain healthy weight, helps control blood glucose, and lowers cholesterol.  Discuss purchasing fresh or frozen vegetable to reduce sodium and not to add grease, fat or sugar. Consume <18oz of red meat per week. Consume lean cuts of meats and very little of meats high in sodium and nitrates such as pork and lunch meats. Discussed portion control for all food groups.    Comment  Patient has gained 8 lbs since last 30 day review. He says he has not changed his diet since he started the program and continues to follow a "GERD"diet. Will continue to monitor.     Expected Outcome  Patient will work toward meeting his nutritional goals.        Psychosocial: Target Goals: Acknowledge presence or absence of significant depression and/or stress, maximize coping skills, provide positive support system. Participant is able to verbalize types and ability to use techniques and skills needed for reducing stress and  depression.  Initial Review & Psychosocial Screening: Initial Psych Review & Screening - 08/01/18 1147      Initial Review   Current issues with  None Identified      Family Dynamics   Good Support System?  Yes      Barriers   Psychosocial barriers to participate in program  There are no identifiable barriers or psychosocial needs.      Screening Interventions   Interventions  Encouraged to exercise    Expected Outcomes  Short Term goal: Identification and review with participant of any Quality of Life or Depression concerns found by scoring the questionnaire.;Long Term goal: The participant improves quality of Life and PHQ9 Scores as seen by post scores and/or verbalization of changes       Quality of Life Scores: Quality of Life - 08/01/18 1751  Quality of Life   Select  Quality of Life      Quality of Life Scores   Health/Function Pre  9.2 %    Socioeconomic Pre  29 %    Psych/Spiritual Pre  16.29 %    Family Pre  27.6 %    GLOBAL Pre  17.09 %      Scores of 19 and below usually indicate a poorer quality of life in these areas.  A difference of  2-3 points is a clinically meaningful difference.  A difference of 2-3 points in the total score of the Quality of Life Index has been associated with significant improvement in overall quality of life, self-image, physical symptoms, and general health in studies assessing change in quality of life.   PHQ-9: Recent Review Flowsheet Data    Depression screen [facility] 2/9 08/01/2018 10/18/2017 07/11/2017 05/23/2017 02/19/2017   Decreased Interest 0 0 0 2 0   Down, Depressed, Hopeless 0 0 0 1 2   PHQ - 2 Score 0 0 0 3 2   Altered sleeping 0 - 0 0 0   Tired, decreased energy 1 - 0 1 1   Change in appetite 2 - 0 0 0   Feeling bad or failure about yourself  0 - 0 1 1   Trouble concentrating 0 - 0 0 0   Moving slowly or fidgety/restless 0 - 0 0 1   Suicidal thoughts 0 - 0 0 0   PHQ-9 Score 3 - 0 5 5   Difficult doing work/chores  Somewhat difficult - Not difficult at all Somewhat difficult Somewhat difficult     Interpretation of Total Score  Total Score Depression Severity:  1-4 = Minimal depression, 5-9 = Mild depression, 10-14 = Moderate depression, 15-19 = Moderately severe depression, 20-27 = Severe depression   Psychosocial Evaluation and Intervention: Psychosocial Evaluation - 08/01/18 1150      Psychosocial Evaluation & Interventions   Interventions  Encouraged to exercise with the program and follow exercise prescription    Continue Psychosocial Services   No Follow up required       Psychosocial Re-Evaluation: Psychosocial Re-Evaluation    Row Name 08/29/18 0814 09/16/18 1411           Psychosocial Re-Evaluation   Current issues with  None Identified  None Identified      Comments  Patient's initial QOL score was 17.09 and his PHQ-9 score was 3 with no psychosocial issues identified.   Patient's initial QOL score was 17.09 and his PHQ-9 score was 3 with no psychosocial issues identified.       Expected Outcomes  Patient will have no psychosocial issues identified at discharge.   Patient will have no psychosocial issues identified at discharge.       Interventions  Relaxation education;Stress management education;Encouraged to attend Pulmonary Rehabilitation for the exercise  Relaxation education;Stress management education;Encouraged to attend Pulmonary Rehabilitation for the exercise      Continue Psychosocial Services   No Follow up required  No Follow up required         Psychosocial Discharge (Final Psychosocial Re-Evaluation): Psychosocial Re-Evaluation - 09/16/18 1411      Psychosocial Re-Evaluation   Current issues with  None Identified    Comments  Patient's initial QOL score was 17.09 and his PHQ-9 score was 3 with no psychosocial issues identified.     Expected Outcomes  Patient will have no psychosocial issues identified at discharge.     Interventions  Relaxation education;Stress  management education;Encouraged to attend Pulmonary Rehabilitation for the exercise    Continue Psychosocial Services   No Follow up required        Education: Education Goals: Education classes will be provided on a weekly basis, covering required topics. Participant will state understanding/return demonstration of topics presented.  Learning Barriers/Preferences: Learning Barriers/Preferences - 08/01/18 1009      Learning Barriers/Preferences   Learning Barriers  None    Learning Preferences  Skilled Demonstration;Individual Instruction;Group Instruction       Education Topics: How Lungs Work and Diseases: - Discuss the anatomy of the lungs and diseases that can affect the lungs, such as COPD.   Exercise: -Discuss the importance of exercise, FITT principles of exercise, normal and abnormal responses to exercise, and how to exercise safely.   Environmental Irritants: -Discuss types of environmental irritants and how to limit exposure to environmental irritants.   PULMONARY REHAB CHRONIC OBSTRUCTIVE PULMONARY DISEASE from 09/05/2018 in [place]  Date  08/08/18  Educator  Wynetta Emery  Instruction Review Code  2- Demonstrated Understanding      Meds/Inhalers and oxygen: - Discuss respiratory medications, definition of an inhaler and oxygen, and the proper way to use an inhaler and oxygen.   PULMONARY REHAB CHRONIC OBSTRUCTIVE PULMONARY DISEASE from 09/05/2018 in [place]  Date  08/15/18  Educator  Wynetta Emery       Energy Saving Techniques: - Discuss methods to conserve energy and decrease shortness of breath when performing activities of daily living.    PULMONARY REHAB CHRONIC OBSTRUCTIVE PULMONARY DISEASE from 09/05/2018 in [place]  Date  08/22/18  Educator  [place]  Instruction Review Code  2- Demonstrated Understanding      Bronchial Hygiene / Breathing Techniques: - Discuss breathing mechanics,  pursed-lip breathing technique,  proper posture, effective ways to clear airways, and other functional breathing techniques   PULMONARY REHAB CHRONIC OBSTRUCTIVE PULMONARY DISEASE from 09/05/2018 in [place]  Date  08/29/18  Educator  Wynetta Emery  Instruction Review Code  2- Demonstrated Understanding      Cleaning Equipment: - Provides group verbal and written instruction about the health risks of elevated stress, cause of high stress, and healthy ways to reduce stress.   PULMONARY REHAB CHRONIC OBSTRUCTIVE PULMONARY DISEASE from 09/05/2018 in [place]  Date  09/05/18  Educator  Wynetta Emery  Instruction Review Code  2- Demonstrated Understanding      Nutrition I: Fats: - Discuss the types of cholesterol, what cholesterol does to the body, and how cholesterol levels can be controlled.   Nutrition II: Labels: -Discuss the different components of food labels and how to read food labels.   Respiratory Infections: - Discuss the signs and symptoms of respiratory infections, ways to prevent respiratory infections, and the importance of seeking medical treatment when having a respiratory infection.   Stress I: Signs and Symptoms: - Discuss the causes of stress, how stress may lead to anxiety and depression, and ways to limit stress.   Stress II: Relaxation: -Discuss relaxation techniques to limit stress.   Oxygen for Home/Travel: - Discuss how to prepare for travel when on oxygen and proper ways to transport and store oxygen to ensure safety.   Knowledge Questionnaire Score: Knowledge Questionnaire Score - 08/01/18 1151      Knowledge Questionnaire Score   Pre Score  14/18       Core Components/Risk Factors/Patient Goals at Admission: Personal Goals and Risk Factors at  Admission - 08/01/18 1151      Core Components/Risk Factors/Patient Goals on Admission    Weight Management  Weight Maintenance    Personal Goal Other  Yes     Personal Goal  Patient wants to breathe better, get stronger, be able to sing on the choir.     Intervention  Attend PR 2 x week and supplement with at home exercise 3 x week.     Expected Outcomes  Reach personal goals       Core Components/Risk Factors/Patient Goals Review:  Goals and Risk Factor Review    Row Name 08/29/18 5701 09/16/18 1409           Core Components/Risk Factors/Patient Goals Review   Personal Goals Review  Weight Management/Obesity;Improve shortness of breath with ADL's Breathe better; get stronger; be able to sing in choir.   Weight Management/Obesity;Improve shortness of breath with ADL's Breathe better; get stronger; be able to sing in choir.       Review  Patient has completed 8 sessions maintaining his weight since his orientation visit. He is doing well in the program with some progression. He states his breathing has improved and he is now able to shower without having to use his MDI multiple times. He also states he is ablet to put out his trash now which he has not been able to do in a long time. He is very pleased with his progress so far. Will continue to monitor for progress.   Patient has completed 11 sessions gaining 8 lbs since last 30 day review. He continues to do well in the program with progression. He continues to say he is breathing better and using his inhaler less now. He is able to do more things around the house with improved SOB. Outpatient pulmonary rehab services was suspended 09/09/18 due to COVID-19 restrictions. Patient says he plans to buy a treadmill to keep walking and exercising while he is out. Will continue to monitor.       Expected Outcomes  Patient will continue to attend sessions and complete the program meeting his personal goals.   Patient will continue to attend sessions and complete the program meeting his personal goals.          Core Components/Risk Factors/Patient Goals at Discharge (Final Review):  Goals and Risk Factor Review  - 09/16/18 1409      Core Components/Risk Factors/Patient Goals Review   Personal Goals Review  Weight Management/Obesity;Improve shortness of breath with ADL's   Breathe better; get stronger; be able to sing in choir.    Review  Patient has completed 11 sessions gaining 8 lbs since last 30 day review. He continues to do well in the program with progression. He continues to say he is breathing better and using his inhaler less now. He is able to do more things around the house with improved SOB. Outpatient pulmonary rehab services was suspended 09/09/18 due to COVID-19 restrictions. Patient says he plans to buy a treadmill to keep walking and exercising while he is out. Will continue to monitor.     Expected Outcomes  Patient will continue to attend sessions and complete the program meeting his personal goals.        ITP Comments: ITP Comments    Row Name 08/29/18 1257 09/16/18 1405 11/25/18 0938       ITP Comments  Patient attended the Family Structure class with hospital chaplian to discuss how their recent event has effected their life.  Pulmonary rehab services was suspended 09/09/18 due to COVID-19 restrictions. Services will resume when restrictions are lifted.   Patient is deceased. MD will be notified.         Comments: Patient stopped coming to Cardiac Rehab on 09/05/18 due to department closing secondary to COVID-19. He is now deceased.  Doctor will be informed.

## 2018-11-25 DEATH — deceased

## 2018-11-27 ENCOUNTER — Ambulatory Visit: Payer: Medicare Other | Admitting: Allergy & Immunology

## 2018-11-28 LAB — CULTURE, BLOOD (ROUTINE X 2)
Culture: NO GROWTH
Culture: NO GROWTH
Special Requests: ADEQUATE

## 2018-12-04 ENCOUNTER — Telehealth (HOSPITAL_COMMUNITY): Payer: Self-pay

## 2018-12-04 NOTE — Telephone Encounter (Signed)
Death Cert signed by DM mailed to [facility] in pre stamped envelope per [employer] home. Copy placed in red folder

## 2018-12-25 NOTE — Discharge Summary (Signed)
  Advanced Heart Failure Death Summary  Death Summary   Patient ID: Eric Spencer MRN: [id number], DOB/AGE: 03/05/1946 73 y.o. Admit date: 11/14/2018 D/C date:   11-26-18    Primary Discharge Diagnoses:  1. Shock, cardiogenic shock Impella placement 5/24  2. CAD: NSTEMI 3. AKI 4. Acute Hypoxic Respiratory Failure Urgent Intubation 11/10/2018 5. Paroxysmal Atrial Fib with RVR 6. ID 7. Acute Blood loss Anemia Hematoma  8. FEN  9. [place] [facility] Course:  [name] was a  73 year old with history of severe COPD and hypertension came to the ER initially with increased dyspnea and chest pain, found to be in shock.   TnI initially 0.34, AKI also noted with creatinine up to 2.56.  He was thought to have a COPD exacerbation.  Initially got IVF bolus and BP stabilized.  However, decompensated with rise in troponin to 10.33.  Bedside echo was done, EF thought to be about 20%.  Concern for cardiogenic shock so dobutamine started. He later went in atrial fibrillation with RVR with worsening shock.  Dobutamine started and norepinephrine begun.  Due to increased work of breathing, CCM consulted and performed intubation. Dr. Sallyanne Kuster and Dr Aundra Dubin discussed patient, decision made to send to cath lab for angiography, RHC, and Impella.  RHC showed low output with elevated filling pressures.  Coronary angiography showed occluded mid LCx, 60% mid RCA, and 95% prox LAD with TIMI 2 flow.  Impella successfully placed. After cath he developed hypotension, A fib RVR, and temp 100.5. He also had procedural hematoma.  Pressors increased and vasopressin was added. Stress dose steroids and antibiotics added for possible pneumonia.  Procalcitonin, WBC, and creatinine went up. Antibiotics narrowed based on culture results. Nephrology was consulted due to ATN. He had not indication for dialysis with adequate urine output. He required heparin drip with mechanical support by impella and had ongoing oozing in right  groin requiring intermittent blood transfusions. He was extubated on May 29th but later that day he developed increased work of breathing and was placed on Bipap.  On November 26, 2022 he developed abdominal pain and tachypnea. He required urgent intubation and was sent for stat CT of his abdomen and this showed ischemic segment of his small bowel with extensive pneumotosis. General Surgery was consulted for possible surgical intervention however due to multiple co-morbidities and poor clinical state this was not an option. He was on max pressors and continued to decline and was transitioned to comfort care and passed on 11/26/2018.   Hospital course was complicated by cardiogenic shock, septic shock, acute respiratory failure, ischemic bowel,and ATN.    Duration of Discharge Encounter: Greater than 35 minutes   Signed, Darrick Grinder, NP 12/04/2018, 9:33 AM

## 2018-12-30 ENCOUNTER — Encounter: Payer: Medicare Other | Admitting: Adult Health

## 2019-01-17 ENCOUNTER — Other Ambulatory Visit: Payer: Self-pay | Admitting: Internal Medicine

## 2019-02-21 LAB — ECHOCARDIOGRAM LIMITED
Height: 74 in
Weight: 3139.35 oz

## 2020-02-25 IMAGING — CT CT ABDOMEN AND PELVIS WITHOUT CONTRAST
2 of 4 series · 12 of 46 positions shown, 14 images · non-contrast
Comparison: Chest radiograph from earlier today.

CLINICAL DATA: Inpatient. Generalized acute abdominal pain.
Hypotension. Recently intubated.

EXAM:
CT CHEST, ABDOMEN AND PELVIS WITHOUT CONTRAST
TECHNIQUE: Multidetector CT imaging of the chest, abdomen and pelvis was
performed following the standard protocol without IV contrast.

[Series 3: cap without · axial · non-contrast · 0.78mm/px · z∈[+968,+1573]mm · 9 of 143 slices shown, 11 images]
[im 11/143  soft-tissue]
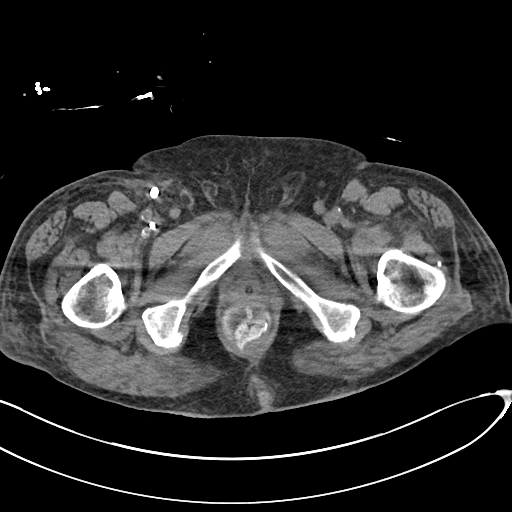
[im 11/143  bone]
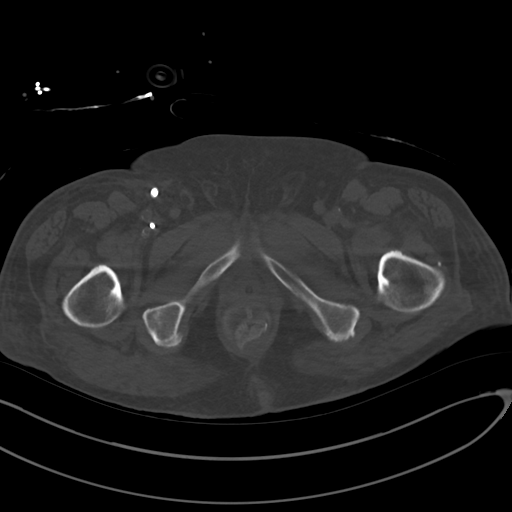
[im 31/143  soft-tissue]
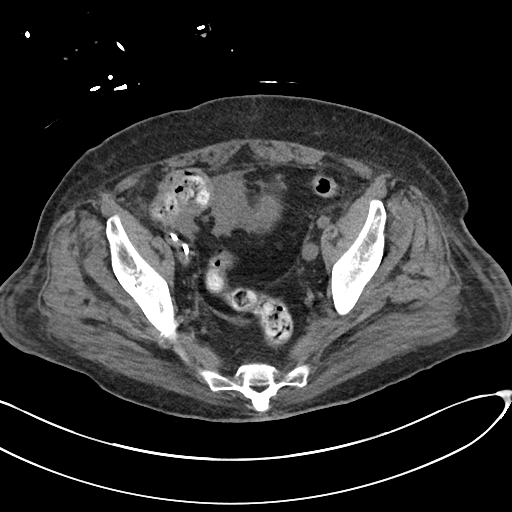
[im 41/143  soft-tissue]
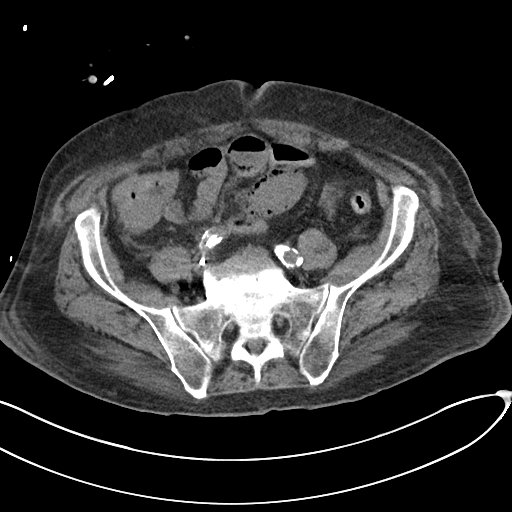
[im 61/143  soft-tissue]
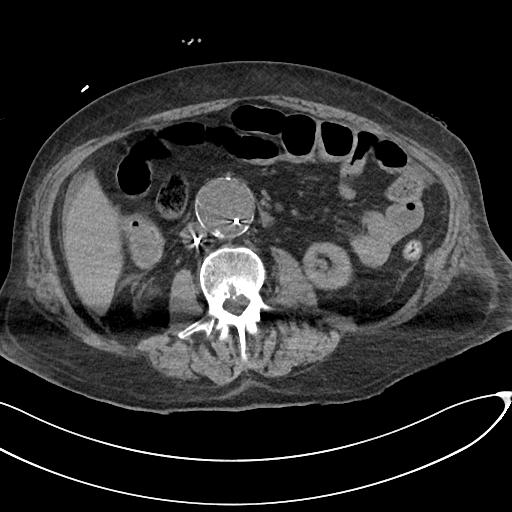
[im 72/143  soft-tissue]
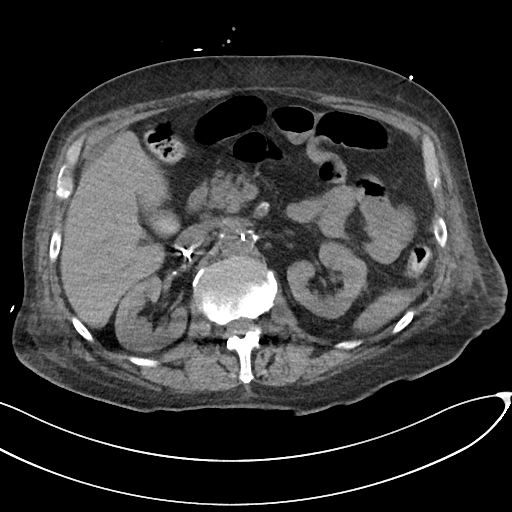
[im 82/143  soft-tissue]
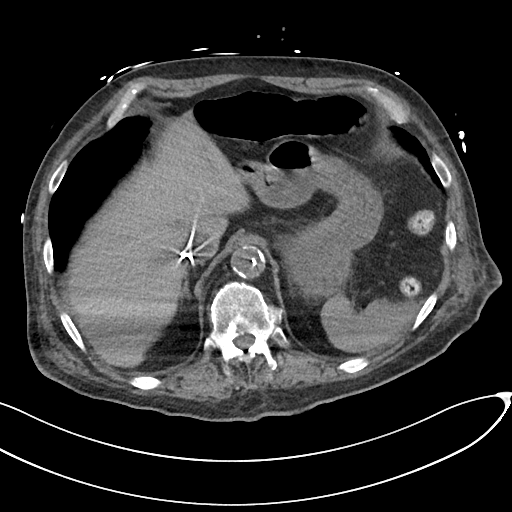
[im 102/143  soft-tissue]
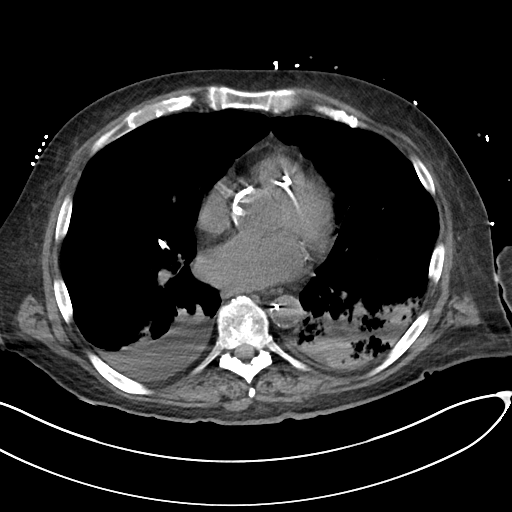
[im 112/143  soft-tissue]
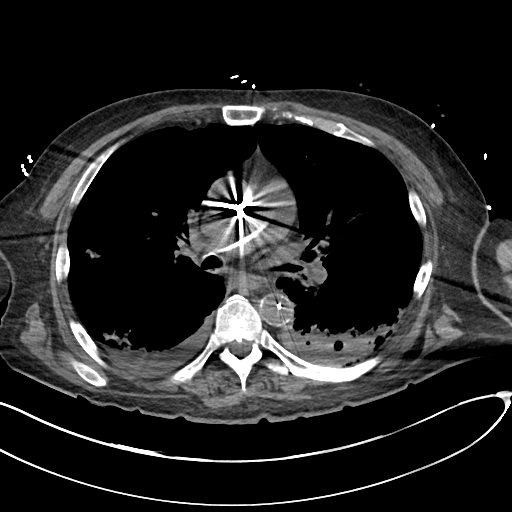
[im 132/143  soft-tissue]
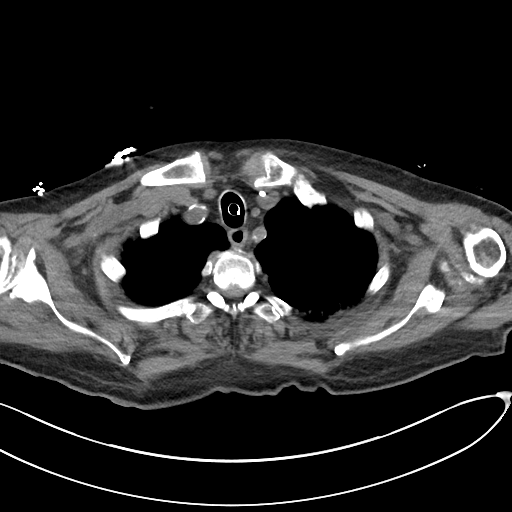
[im 132/143  bone]
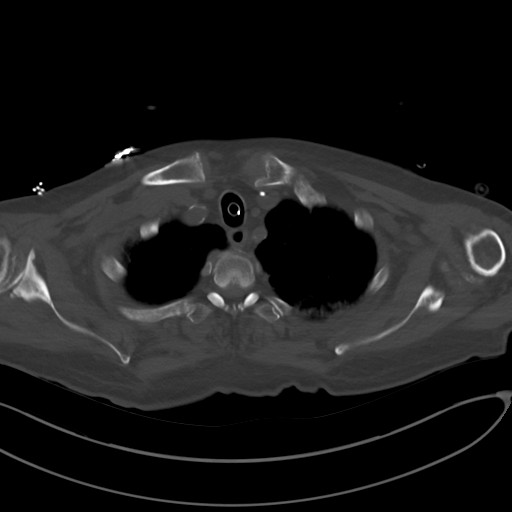

[Series 6: cor · coronal · 0.86mm/px · 3 of 94 slices shown]
[im 32/94  soft-tissue]
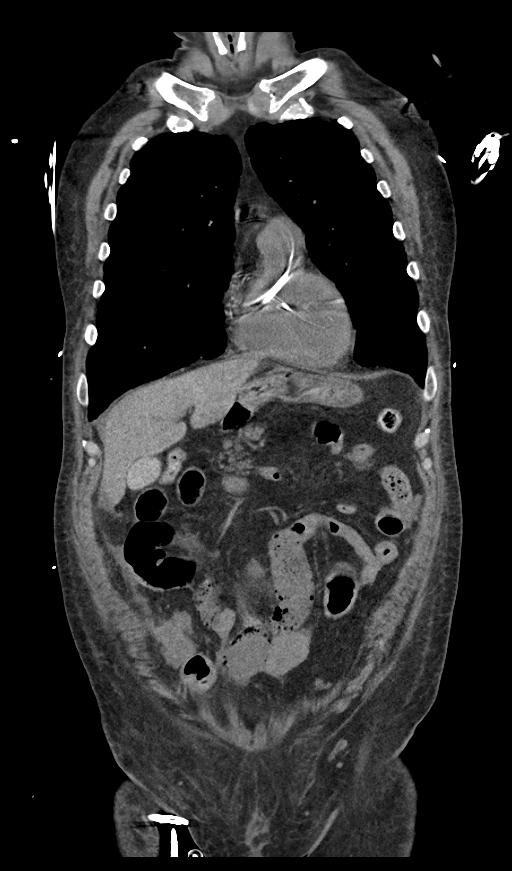
[im 42/94  soft-tissue]
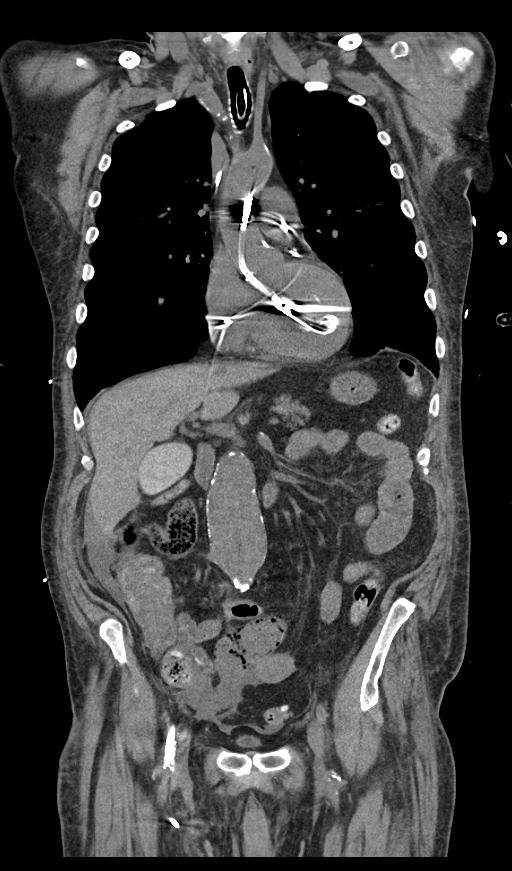
[im 52/94  soft-tissue]
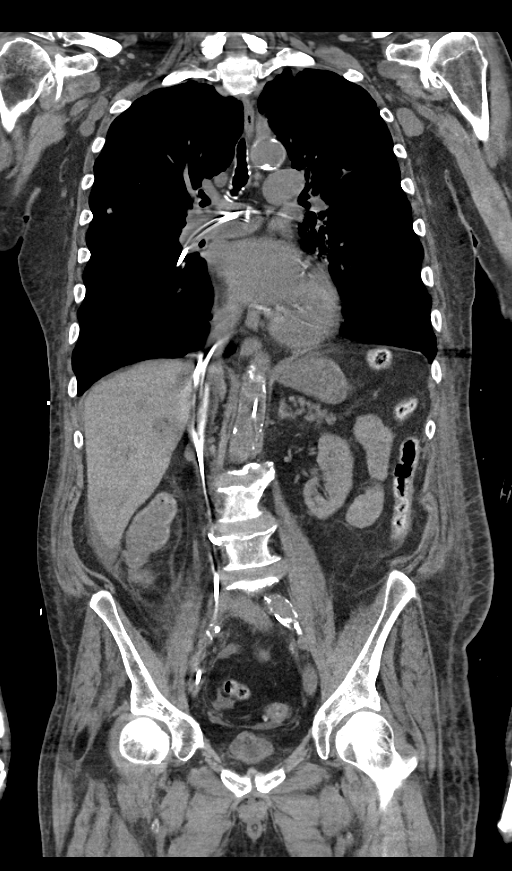

[12 of 46 positions shown; findings below may reference images not displayed]

FINDINGS: CT CHEST FINDINGS

Cardiovascular: Normal heart size. No significant pericardial
effusion/thickening. Three-vessel coronary atherosclerosis.
Atherosclerotic nonaneurysmal thoracic aorta. Right common femoral
approach Impella device terminates in left ventricle. Top-normal
caliber main pulmonary artery (3.3 cm diameter). Left internal
jugular central venous catheter terminates in the middle third of
the superior vena cava. Right common femoral approach Swan-Ganz
catheter terminates within a segmental anterior right lower lobe
pulmonary artery branch.

Mediastinum/Nodes: No discrete thyroid nodules. Unremarkable
esophagus. No pathologically enlarged axillary, mediastinal or hilar
lymph nodes, noting limited sensitivity for the detection of hilar
adenopathy on this noncontrast study.

Lungs/Pleura: Endotracheal tube tip is 3.9 cm above the carina. No
pneumothorax. Small dependent right pleural effusion. No significant
left pleural effusion. Moderate centrilobular and paraseptal
emphysema with mild diffuse bronchial wall thickening. Patchy debris
in the dependent mainstem and lower lobe bronchi bilaterally.
Extensive patchy consolidation and ground-glass opacity throughout
the dependent lungs bilaterally involving the bilateral upper and
lower lobes, most prominent in the left lower lobe. Several of the
regions of consolidation appear nodular, for example measuring
cm in the posterior right upper lobe (series 5/image 79). No
discrete lung masses.

Musculoskeletal: No aggressive appearing focal osseous lesions.
Incompletely healed chronic appearing posterior right ninth rib
fracture. Mild thoracic spondylosis.

CT ABDOMEN PELVIS FINDINGS

Hepatobiliary: Normal liver with no liver mass. Sludge versus
vicarious excretion of contrast in the gallbladder. No discrete
radiopaque cholelithiasis. No gallbladder wall thickening. No
pericholecystic fluid. No biliary ductal dilatation.

Pancreas: Normal, with no mass or duct dilation.

Spleen: Normal size. No mass.

Adrenals/Urinary Tract: Normal adrenals. No renal stones. No
hydronephrosis. No contour deforming renal masses. Bladder is
completely decompressed by indwelling Foley catheter and appears
grossly normal.

Stomach/Bowel: Normal non-distended stomach. There is segmental
pneumatosis in mid to distal small bowel (series 3/image 106) with
associated mild small bowel dilatation up to 3.5 cm diameter, mild
small bowel wall thickening and scattered small bowel fluid levels.
No discrete small bowel caliber transition. There is associated
mesenteric edema and small volume ascites throughout the right
abdomen and deep pelvis. Appendix appears normal. There is mild wall
thickening in the cecum. Mild sigmoid diverticulosis without
associated colonic wall thickening or significant pericolonic fat
stranding. Retained oral contrast in the distal colon. Rectal
drainage catheter in place.

Vascular/Lymphatic: Atherosclerotic abdominal aorta with 5.1 cm
infrarenal abdominal aortic aneurysm. No pathologically enlarged
lymph nodes in the abdomen or pelvis.

Reproductive: Normal size prostate.

Other: No focal fluid collection. No pneumoperitoneum. Mild
anasarca.

Musculoskeletal: No aggressive appearing focal osseous lesions.
Marked lumbar spondylosis.
IMPRESSION: 1. Pathologic small bowel segment in the lower abdomen with
extensive pneumatosis and surrounding ascites and mesenteric edema,
worrisome for small bowel ischemia. No free air. Mildly dilated
distal small bowel loops with air-fluid levels, favor ileus.
2. Nonspecific mild wall thickening in the cecum and ascending colon
which could represent ischemia or primary or reactive colitis.
3. Extensive patchy consolidation throughout the dependent lungs,
most prominent in the left lower lobe with patchy debris in the
central airways, favoring multilobar aspiration pneumonia. Follow-up
chest CT in 3 months recommended given nodular appearance of several
of the foci of consolidation.
4. Small dependent right pleural effusion.
5. Infrarenal 5.1 cm Abdominal Aortic Aneurysm ([id number]-7BK.A).
Recommend follow-up CTA of the abdomen and pelvis in 3-6 months and
vascular surgery referral/consultation. This recommendation follows
ACR consensus guidelines: White Paper of the ACR Incidental Findings
Committee II on Vascular Findings. [HOSPITAL] 9446;
[DATE].
6. Support structures as detailed. The right common femoral approach
Swan-Ganz catheter terminates within an anterior segmental pulmonary
artery branch in the right lower lobe, recommend retracting 5 cm.
7. Aortic Atherosclerosis ([id number]-DQT.T) and Emphysema ([id number].F).

These results were called by telephone at the time of interpretation
on 11/23/2018 at [DATE] to Dr. DOCTO KAUTE , who verbally
acknowledged these results.
